# Patient Record
Sex: Male | Born: 1935 | Race: White | Hispanic: No | Marital: Married | Smoking: Never smoker
Health system: Southern US, Community
[De-identification: ages and names within clinical notes are randomized; demographics above are authoritative.]

## PROBLEM LIST (undated history)

## (undated) DIAGNOSIS — E669 Obesity, unspecified: Secondary | ICD-10-CM

## (undated) DIAGNOSIS — E785 Hyperlipidemia, unspecified: Secondary | ICD-10-CM

## (undated) DIAGNOSIS — G40909 Epilepsy, unspecified, not intractable, without status epilepticus: Secondary | ICD-10-CM

## (undated) DIAGNOSIS — I639 Cerebral infarction, unspecified: Secondary | ICD-10-CM

## (undated) DIAGNOSIS — N183 Chronic kidney disease, stage 3 (moderate): Secondary | ICD-10-CM

## (undated) DIAGNOSIS — Z8673 Personal history of transient ischemic attack (TIA), and cerebral infarction without residual deficits: Secondary | ICD-10-CM

## (undated) DIAGNOSIS — I2510 Atherosclerotic heart disease of native coronary artery without angina pectoris: Secondary | ICD-10-CM

## (undated) DIAGNOSIS — I5032 Chronic diastolic (congestive) heart failure: Secondary | ICD-10-CM

## (undated) DIAGNOSIS — K219 Gastro-esophageal reflux disease without esophagitis: Secondary | ICD-10-CM

## (undated) DIAGNOSIS — G629 Polyneuropathy, unspecified: Secondary | ICD-10-CM

## (undated) DIAGNOSIS — I10 Essential (primary) hypertension: Secondary | ICD-10-CM

## (undated) DIAGNOSIS — I82409 Acute embolism and thrombosis of unspecified deep veins of unspecified lower extremity: Secondary | ICD-10-CM

## (undated) DIAGNOSIS — R001 Bradycardia, unspecified: Secondary | ICD-10-CM

## (undated) DIAGNOSIS — N400 Enlarged prostate without lower urinary tract symptoms: Secondary | ICD-10-CM

## (undated) DIAGNOSIS — N210 Calculus in bladder: Secondary | ICD-10-CM

## (undated) DIAGNOSIS — I350 Nonrheumatic aortic (valve) stenosis: Secondary | ICD-10-CM

## (undated) HISTORY — DX: Obesity, unspecified: E66.9

## (undated) HISTORY — DX: Atherosclerotic heart disease of native coronary artery without angina pectoris: I25.10

## (undated) HISTORY — DX: Chronic diastolic (congestive) heart failure: I50.32

## (undated) HISTORY — DX: Gastro-esophageal reflux disease without esophagitis: K21.9

## (undated) HISTORY — DX: Polyneuropathy, unspecified: G62.9

## (undated) HISTORY — PX: TRANSURETHRAL RESECTION OF PROSTATE: SHX73

## (undated) HISTORY — DX: Nonrheumatic aortic (valve) stenosis: I35.0

## (undated) HISTORY — DX: Hyperlipidemia, unspecified: E78.5

## (undated) HISTORY — PX: OTHER SURGICAL HISTORY: SHX169

## (undated) HISTORY — DX: Bradycardia, unspecified: R00.1

## (undated) HISTORY — DX: Acute embolism and thrombosis of unspecified deep veins of unspecified lower extremity: I82.409

## (undated) HISTORY — DX: Essential (primary) hypertension: I10

## (undated) HISTORY — DX: Cerebral infarction, unspecified: I63.9

## (undated) HISTORY — DX: Personal history of transient ischemic attack (TIA), and cerebral infarction without residual deficits: Z86.73

## (undated) HISTORY — DX: Epilepsy, unspecified, not intractable, without status epilepticus: G40.909

## (undated) HISTORY — DX: Benign prostatic hyperplasia without lower urinary tract symptoms: N40.0

## (undated) HISTORY — DX: Calculus in bladder: N21.0

---

## 1998-03-30 ENCOUNTER — Encounter: Admission: RE | Admit: 1998-03-30 | Discharge: 1998-06-28 | Payer: Self-pay | Admitting: Neurology

## 2001-02-26 DIAGNOSIS — I82409 Acute embolism and thrombosis of unspecified deep veins of unspecified lower extremity: Secondary | ICD-10-CM

## 2001-02-26 HISTORY — DX: Acute embolism and thrombosis of unspecified deep veins of unspecified lower extremity: I82.409

## 2001-07-15 ENCOUNTER — Encounter: Payer: Self-pay | Admitting: Urology

## 2001-07-15 ENCOUNTER — Inpatient Hospital Stay (HOSPITAL_COMMUNITY): Admission: AD | Admit: 2001-07-15 | Discharge: 2001-07-16 | Payer: Self-pay | Admitting: Urology

## 2001-07-15 ENCOUNTER — Encounter: Payer: Self-pay | Admitting: *Deleted

## 2001-10-10 ENCOUNTER — Inpatient Hospital Stay (HOSPITAL_COMMUNITY): Admission: EM | Admit: 2001-10-10 | Discharge: 2001-10-13 | Payer: Self-pay | Admitting: *Deleted

## 2001-10-10 ENCOUNTER — Encounter: Payer: Self-pay | Admitting: *Deleted

## 2003-09-23 ENCOUNTER — Ambulatory Visit (HOSPITAL_COMMUNITY): Admission: RE | Admit: 2003-09-23 | Discharge: 2003-09-23 | Payer: Self-pay | Admitting: Family Medicine

## 2004-10-24 ENCOUNTER — Encounter (HOSPITAL_COMMUNITY): Admission: RE | Admit: 2004-10-24 | Discharge: 2005-01-22 | Payer: Self-pay | Admitting: Cardiology

## 2004-11-20 ENCOUNTER — Encounter (INDEPENDENT_AMBULATORY_CARE_PROVIDER_SITE_OTHER): Payer: Self-pay | Admitting: *Deleted

## 2004-11-20 ENCOUNTER — Inpatient Hospital Stay (HOSPITAL_COMMUNITY): Admission: RE | Admit: 2004-11-20 | Discharge: 2004-11-25 | Payer: Self-pay | Admitting: Urology

## 2006-07-02 ENCOUNTER — Emergency Department (HOSPITAL_COMMUNITY): Admission: EM | Admit: 2006-07-02 | Discharge: 2006-07-02 | Payer: Self-pay | Admitting: Emergency Medicine

## 2008-08-16 ENCOUNTER — Encounter (INDEPENDENT_AMBULATORY_CARE_PROVIDER_SITE_OTHER): Payer: Self-pay | Admitting: *Deleted

## 2008-08-16 ENCOUNTER — Inpatient Hospital Stay (HOSPITAL_COMMUNITY): Admission: EM | Admit: 2008-08-16 | Discharge: 2008-08-17 | Payer: Self-pay | Admitting: Emergency Medicine

## 2008-08-17 ENCOUNTER — Encounter (INDEPENDENT_AMBULATORY_CARE_PROVIDER_SITE_OTHER): Payer: Self-pay | Admitting: Internal Medicine

## 2008-08-20 ENCOUNTER — Encounter: Admission: RE | Admit: 2008-08-20 | Discharge: 2008-10-14 | Payer: Self-pay | Admitting: Internal Medicine

## 2008-12-15 ENCOUNTER — Emergency Department (HOSPITAL_COMMUNITY): Admission: EM | Admit: 2008-12-15 | Discharge: 2008-12-15 | Payer: Self-pay | Admitting: Emergency Medicine

## 2010-03-19 ENCOUNTER — Encounter: Payer: Self-pay | Admitting: Cardiology

## 2010-04-17 ENCOUNTER — Ambulatory Visit
Payer: Medicare Other | Attending: Rehabilitative and Restorative Service Providers" | Admitting: Rehabilitative and Restorative Service Providers"

## 2010-06-01 LAB — POCT CARDIAC MARKERS
CKMB, poc: <1 ng/mL — ABNORMAL LOW (ref 1.0–8.0)
Myoglobin, poc: 95.9 ng/mL (ref 12–200)
Troponin i, poc: <0.05 ng/mL (ref 0.00–0.09)

## 2010-06-01 LAB — DIFFERENTIAL
Basophils Absolute: 0.1 10*3/uL (ref 0.0–0.1)
Eosinophils Relative: 4 % (ref 0–5)
Lymphs Abs: 1.4 10*3/uL (ref 0.7–4.0)
Neutrophils Relative %: 70 % (ref 43–77)

## 2010-06-01 LAB — COMPREHENSIVE METABOLIC PANEL
AST: 14 U/L (ref 0–37)
Albumin: 3.1 g/dL — ABNORMAL LOW (ref 3.5–5.2)
Creatinine, Ser: 1.15 mg/dL (ref 0.4–1.5)
GFR calc Af Amer: >60 mL/min (ref >60)
GFR calc non Af Amer: >60 mL/min (ref >60)
Glucose, Bld: 97 mg/dL (ref 70–99)
Potassium: 4.4 mEq/L (ref 3.5–5.1)
Sodium: 143 mEq/L (ref 135–145)
Total Protein: 6.8 g/dL (ref 6.0–8.3)

## 2010-06-01 LAB — APTT: aPTT: 24 seconds (ref 24–37)

## 2010-06-01 LAB — PROTIME-INR
INR: 1.02 (ref 0.00–1.49)
Prothrombin Time: 13.3 seconds (ref 11.6–15.2)

## 2010-06-05 LAB — BASIC METABOLIC PANEL
BUN: 27 mg/dL — ABNORMAL HIGH (ref 6–23)
CO2: 27 mEq/L (ref 19–32)
Calcium: 8.7 mg/dL (ref 8.4–10.5)
Chloride: 107 mEq/L (ref 96–112)
GFR calc Af Amer: >60 mL/min (ref >60)
GFR calc Af Amer: >60 mL/min (ref >60)
GFR calc non Af Amer: 58 mL/min — ABNORMAL LOW (ref >60)
Glucose, Bld: 117 mg/dL — ABNORMAL HIGH (ref 70–99)
Sodium: 142 mEq/L (ref 135–145)
Sodium: 142 mEq/L (ref 135–145)

## 2010-06-05 LAB — LIPID PANEL
HDL: 35 mg/dL — ABNORMAL LOW (ref >39)
LDL Cholesterol: 144 mg/dL — ABNORMAL HIGH (ref 0–99)
Total CHOL/HDL Ratio: 6.6 RATIO
Triglycerides: 254 mg/dL — ABNORMAL HIGH (ref <150)
VLDL: 51 mg/dL — ABNORMAL HIGH (ref 0–40)

## 2010-06-05 LAB — CBC
HCT: 41.1 % (ref 39.0–52.0)
HCT: 41.7 % (ref 39.0–52.0)
Hemoglobin: 14.1 g/dL (ref 13.0–17.0)
MCHC: 34.3 g/dL (ref 30.0–36.0)
MCHC: 34.7 g/dL (ref 30.0–36.0)
MCV: 95.3 fL (ref 78.0–100.0)
MCV: 95.9 fL (ref 78.0–100.0)
RBC: 4.31 MIL/uL (ref 4.22–5.81)
RBC: 4.35 MIL/uL (ref 4.22–5.81)
WBC: 7.9 10*3/uL (ref 4.0–10.5)

## 2010-06-05 LAB — CARDIAC PANEL(CRET KIN+CKTOT+MB+TROPI)
CK, MB: 0.9 ng/mL (ref 0.3–4.0)
Relative Index: INVALID (ref 0.0–2.5)
Total CK: 41 U/L (ref 7–232)
Total CK: 78 U/L (ref 7–232)
Troponin I: 0.01 ng/mL (ref 0.00–0.06)

## 2010-06-05 LAB — APTT: aPTT: 25 seconds (ref 24–37)

## 2010-06-05 LAB — POCT CARDIAC MARKERS: CKMB, poc: <1 ng/mL — ABNORMAL LOW (ref 1.0–8.0)

## 2010-06-05 LAB — DIFFERENTIAL
Basophils Relative: 1 % (ref 0–1)
Eosinophils Relative: 5 % (ref 0–5)
Neutrophils Relative %: 62 % (ref 43–77)

## 2010-06-05 LAB — BRAIN NATRIURETIC PEPTIDE: Pro B Natriuretic peptide (BNP): 54 pg/mL (ref 0.0–100.0)

## 2010-07-11 NOTE — Discharge Summary (Signed)
Benjamin Kaiser, PENDER NO.:  [id number]   MEDICAL RECORD NO.:  [id number]          PATIENT TYPE:  INP   LOCATION:  2007                         FACILITY:  [facility]   PHYSICIAN:  Elliot Cousin, M.D.    DATE OF BIRTH:  24-Feb-1935   DATE OF ADMISSION:  08/16/2008  DATE OF DISCHARGE:  08/17/2008                               DISCHARGE SUMMARY   DISCHARGE DIAGNOSES:  1. Left-sided hemiparesis and paresthesias, likely secondary to an      acute right brain stroke or subcortical small vessel cerebral      infarction.  (The CT angiogram of the head did not reveal      radiographic evidence of an acute stroke).  2. A 60% stenosis of the proximal right internal carotid artery and      tapered obstruction of the left vertebral artery at the level of C2      likely represents a remote dissection per CT angiogram of the neck.      The carotid Doppler study revealed a 40-60% ICA stenosis on the      right and no significant ICA stenosis on the left.  Bilateral      vertebral artery flow was antegrade.  3. THE PATIENT HAS BEEN ENROLLED IN THE POINT TRIAL BY [facility].  4. Hypertension.  5. Seizure disorder.  6. Hyperlipidemia.  7. Mild bradycardia secondary to atenolol.   DISCHARGE MEDICATIONS:  1. Study drug once daily.  2. Lasix 80 mg half a tablet daily.  3. Lisinopril 40 mg daily.  4. Atenolol 100 mg daily.  5. Allopurinol 300 mg daily.  6. Extended release niacin 500 mg daily.  7. Pravastatin 40 mg daily (the the patient was advised to take      Lipitor 40 mg daily if he could afford it and if not, he was      advised to continue pravastatin as previously prescribed).  8. Dilantin 100 mg three tablets twice daily.  9. Aspirin 325 mg daily.   DISCHARGE DISPOSITION:  The patient is being discharged home in improved  and stable condition.  He was advised to follow up with his primary care  physician Dr. Windle Guard in 1 week and with the  [facility] per their recommendation.   CONSULTATIONS:  Neurologists, Dr. Roseanne Reno and Dr. Sharene Skeans.   PROCEDURES PERFORMED:  1. A 2-D echocardiogram on August 17, 2008.  The results are currently      pending.  2. Carotid duplex study, August 16, 2008.  The results revealed right 40-      60% ICA stenosis, no significant ICA stenosis on the left, and      vertebral flow antegrade bilaterally.  3. CT angiogram of the head on August 16, 2008.  The results revealed no      acute intracranial abnormality.  Mild distal small vessel      irregularity compatible with intracranial atherosclerotic change.      Nonvisualization of the left vertebral artery.  The vertebral      artery appears  to be occluded in the neck.  Minimal atherosclerotic      calcifications of the cavernous carotid arteries and at the dural      margin of the right vertebral artery.  4. CT angiogram of the neck on August 16, 2008.  The results revealed      60% stenosis of the proximal right internal carotid artery.  Mild      chronic change of the left common carotid bifurcation without      significant stenosis.  Tapered obstruction of the left vertebral      artery at the level of C2, likely represents a remote dissection.      The right vertebral artery is unremarkable.  5. Chest x-ray on August 16, 2008.  The results revealed stable,      borderline to mild cardiomegaly.  Suboptimal inspiration account      for mild bilateral lower lobe atelectasis.  Otherwise, no acute      cardiopulmonary abnormality.  6. CT scan of the head without contrast on August 16, 2008.  The results      revealed no acute intracranial abnormalities.  Mild chronic      pansinusitis, mild age appropriate generalized atrophy, and mild      changes of small vessel disease of the white matter diffusely.   HISTORY OF PRESENT ILLNESS:  The patient is a 76 year old man with a  past medical history significant for chronic seizure  disorder,  hypertension, and gout.  He presented to the emergency department with a  chief complaint of left upper extremity weakness and numbness.  When the  patient was evaluated in the emergency department, the CT scan of his  head revealed no acute intracranial abnormalities.  His lab data were  virtually unremarkable.  He was mildly hypertensive, otherwise  hemodynamically stable.  His chest x-ray revealed no acute  cardiopulmonary abnormalities.  His EKG revealed normal sinus rhythm  with a heart rate of 78 beats per minute and an incomplete right bundle  branch block.  The patient was admitted for further evaluation and  management.   For additional details, please see the dictated history and physical.   HOSPITAL COURSE:  1. ACUTE STROKE, LIKELY RIGHT BRAIN VERSUS SUBCORTICAL WITH LEFT-SIDED      HEMIPARESIS AND PARESTHESIAS.  The patient's swallowing was      evaluated by the registered nurse in the emergency department.  He      passed the bedside swallow evaluation.  The patient was therefore      started on a heart-healthy diet.  For further evaluation, an MRI of      the brain, MRA of the head, carotid duplex study, and a 2-D      echocardiogram were ordered.  He was subsequently started on      aspirin 325 mg daily (the patient had been taking 81 mg daily at      home).  Neurologist, Dr. Roseanne Reno was consulted.  He evaluated the      patient and per his assessment, the patient probably had an acute      subcortical small vessel cerebral infarction.  He noted that the      patient's body habitus was too large for the MRI scanner and      therefore it was cancelled and the patient was evaluated further      with a CT angiogram of the head and neck.  The results of the CT  angiogram are above.  Although, there was no clear radiographic      evidence of an acute stroke, clinically it was felt that the      patient did experience an acute infarction.  The patient was       approached by the Stroke Team regarding the Point Trial.  He      agreed.  Therefore, Plavix was not started.  He was given the study      drug in addition to aspirin.  Additional studies were ordered for      evaluation and the results revealed normal cardiac enzymes, normal      TSH of 2.093, and therapeutic phenytoin level.  His fasting lipid      profile was assessed and it revealed a total cholesterol of 230,      triglycerides of 254, HDL cholesterol of 35, and LDL cholesterol of      144.  The patient was maintained on niacin and pravastatin.  Both      the physical and occupational therapists evaluated the patient and      per their assessment, the patient would benefit from outpatient      therapy.  They did not believe he required inpatient or home      physical and occupational therapy.  The patient was given a      prescription to take to the [facility]      for further evaluation and management.  Of note, the results of the      carotid Doppler, as well as the CT angiogram of the neck were      consistent with 40-60% ICA stenosis on the right and a possible      remote left vertebral artery occlusion or dissection.  Dr.      Sharene Skeans, neurologist evaluated the patient today and felt that he      was stable for hospital discharge.  He recommended that the patient      continued aspirin at 325 mg daily.  The results of the 2-D      echocardiogram were pending at the time of hospital discharge.  The      patient (and his wife) was advised to call the dictating physician      in a couple of days for the results of the 2-D echocardiogram.  The      patient continues to have left-sided hemiparesis, although his left      arm strength has improved over the course of the hospitalization.  2. HYPERTENSION.  The patient was maintained on atenolol and      lisinopril.  He was also maintained on Lasix.  His heart rate      ranged from 58-69 beats per minute.   The bradycardia was perhaps      the consequence of atenolol.  He was asymptomatic.  3. HYPERLIPIDEMIA.  The patient was maintained on Pravachol and      niacin.  The results of his fasting lipid profile are indicated      above.  Per his history, Pravachol and niacin were started 3 months      ago.  His total cholesterol was elevated at 230 and his LDL      cholesterol was elevated at 144.  I suggested changing statin      therapy from Pravachol to Lipitor; however, the patient and his      wife were somewhat hesitant as they thought that the  copayment may      be quite expensive, although they were not sure.  I did give the      patient a prescription for Lipitor 40 mg daily and advised him to      take Lipitor instead of pravastatin if he could afford it.  If not,      he was advised to maintain pravastatin at the previous dose along      with niacin 500 mg daily.  4. SEIZURE DISORDER.  The patient was maintained on Dilantin.  His      phenytoin level was therapeutic.      Elliot Cousin, M.D.  Electronically Signed     DF/MEDQ  D:  08/17/2008  T:  08/18/2008  Job:  [id number]   cc:   Deanna Artis. Sharene Skeans, M.D.  Windle Guard, M.D.

## 2010-07-11 NOTE — H&P (Signed)
Benjamin Kaiser, Benjamin Kaiser NO.:  [id number]   MEDICAL RECORD NO.:  [id number]          PATIENT TYPE:  INP   LOCATION:  1826                         FACILITY:  [facility]   PHYSICIAN:  Della Goo, M.D. DATE OF BIRTH:  01-Dec-1934   DATE OF ADMISSION:  08/16/2008  DATE OF DISCHARGE:                              HISTORY & PHYSICAL   PRIMARY CARE PHYSICIAN:  Dr. Jeannetta Nap.   CHIEF COMPLAINT:  Right arm numbness, weakness.   HISTORY OF PRESENT ILLNESS:  This is a 76 year old male who was brought  to the emergency department by his wife secondary to awakening at 3 a.m.  with numbness, weakness in his left arm.  He reports he was sleeping and  he was trying to turn over and he could not move his left arm.  He  awakened and realized that his left arm was weak.  He denies having any  headache, chest pain, shortness of breath, fever, chills, nausea,  vomiting, diarrhea.  The patient denies having any speech difficulties  or any visual difficulties.   PAST MEDICAL HISTORY:  1. Significant for hypertension.  2. Seizure disorder.  3. Hyperlipidemia.  4. Gout.  5. Morbid obesity.  6. Hearing impairment with bilateral hearing aids.   PAST SURGICAL HISTORY:  History of a TURP in September 2006.   MEDICATIONS:  1. Pravastatin 40 mg 1 p.o. daily.  2. Niacin 500 mg 1 p.o. daily.  3. Lisinopril 40 mg 1 p.o. daily.  4. Lasix 40 mg 1 p.o. daily.  5. Dilantin Extended 100 mg p.o. b.i.d.  6. Atenolol 100 mg 1 p.o. daily.  7. Allopurinol 300 mg 1 p.o. daily   ALLERGIES:  No known drug allergies.   SOCIAL HISTORY:  The patient is married.  He is a nonsmoker, nondrinker.   FAMILY HISTORY:  Positive for hypertension in his mother.  Positive  heart disease in his mother and son.  No history of cerebrovascular  accidents in his family that he knows of.  No diabetes in his family.  Positive strong family history of cancer in his paternal side of the  family.  His father, his uncle,  his paternal aunts had cancer.   REVIEW OF SYSTEMS:  Pertinents are mentioned.   PHYSICAL EXAMINATION FINDINGS:  This is a 76 year old morbidly obese,  tall male in no visible discomfort or acute distress currently.  VITAL SIGNS:  Temperature 97.6, blood pressure 151/92, heart rate 56,  respirations 20, O2 saturations 96-99%.  HEENT EXAMINATION:  Normocephalic, atraumatic.  There is no scleral  icterus.  Pupils equally round, reactive to light.  Extraocular  movements are intact.  Funduscopic benign.  Nares are patent  bilaterally.  Oropharynx is clear.  There is no tongue deviation.  There  is no facial drooping.  Ears:  The patient has a hearing aid present in  the left ear.  NECK:  Supple, full range of motion.  No thyromegaly, adenopathy or  jugular venous distention.  CARDIOVASCULAR:  Regular rate and rhythm with mild bradycardia.  No  murmurs, gallops or rubs appreciated.  LUNGS:  Clear to  auscultation bilaterally.  ABDOMEN:  Positive bowel sounds, soft, nontender, nondistended.  No  hepatosplenomegaly.  EXTREMITIES:  Without cyanosis, clubbing or edema.  NEUROLOGIC EXAMINATION:  The patient is alert and oriented x3.  Cranial  nerves are intact except for the hearing impairment in both ears.  There  is left upper arm weakness.  The patient is unable to move the left  upper extremity.  The left arm is flaccid.  Right upper extremity has  full range of motion, and sensory function in the right upper extremity  is intact.  Both lower extremities have full range of motion and sensory  and motor function are intact.  Babinski sign is positive in the left  foot and is equivocal in the right foot.   LABORATORY STUDIES:  White blood cell count 7.9, hemoglobin 14.5,  hematocrit 41.7, platelets 185, neutrophils 62%, lymphocytes 24%.  Pro  time 13.8, INR 1.0, PTT 25.  Point-of-care cardiac markers with a  myoglobin of 71.7, CK-MB less than 1.0, troponin less than 0.05.  Sodium  142,  potassium 4.5, chloride 106, carbon dioxide 26, BUN 27, creatinine  1.23 and glucose 117.  CT scan of the head performed.  Results are  negative for any acute findings.  Surgical changes consistent with a  previous frontal craniotomy are present.  Chest x-ray reveals mild  cardiomegaly but no acute cardiopulmonary disease process is seen.  EKG  reveals a normal sinus rhythm and the rate is 78 on the EKG, and an age-  indeterminate anterior infarct is present.  Also, the patient has first-  degree AV block present.   ASSESSMENT:  A 76 year old male being admitted with:  1. Left upper extremity weakness, most probably an acute      cerebrovascular accident.  2. Hypertension.  3. Bradycardia.  4. Hyperlipidemia.  5. Seizure disorder.  6. Morbid obesity.   PLAN:  The patient will be admitted for a cerebrovascular accident  workup.  The patient will be admitted to a telemetry area.  Cardiac  enzymes will be performed.  Neurologic checks will be performed as well.  The patient will be sent for an MRI/MRA study of the brain along with a  carotid ultrasound study.  His regular medications have been verified.  They will be continued.  The patient is having no swallowing or speaking  difficulties at this time.  DVT and GI prophylaxis will be ordered, and  a neurology consultation has been requested.  Further workup will ensue  pending results of the patient's clinical course and results of his  studies.      Della Goo, M.D.  Electronically Signed     HJ/MEDQ  D:  08/16/2008  T:  08/16/2008  Job:  [id number]

## 2010-07-11 NOTE — Consult Note (Signed)
NAMEELHADJI, Benjamin Kaiser NO.:  [id number]   MEDICAL RECORD NO.:  [id number]          PATIENT TYPE:  INP   LOCATION:  2007                         FACILITY:  [facility]   PHYSICIAN:  Benjamin Christmas, MD    DATE OF BIRTH:  August 08, 1934   DATE OF CONSULTATION:  DATE OF DISCHARGE:                                 CONSULTATION   REFERRING PHYSICIAN:  Della Kaiser, M.D.   REASON FOR CONSULTATION:  New onset left upper extremity weakness and  numbness.   HISTORY OF PRESENT ILLNESS:  This is a 76 year old man who woke up about  3 a.m. today with numbness and weakness involving his left arm and hand.  He had no deficit when he went to bed about 3 hours earlier.  He came to  the emergency room around 5 a.m. and was beyond the time window for TPA.  Symptoms remained unchanged since first noticed.  CT of his head showed  no acute intracranial abnormality including no hemorrhage.  The patient  has been on aspirin daily.  There is no previous history of stroke or  TIA.  He had no speech change nor facial weakness.  There was also no  change in his gait.   PAST MEDICAL HISTORY:  Remarkable for gout, arthritis involving hips and  knees, hypertension, hyperlipidemia, chronic seizure disorder.  The  patient's last seizure was in early 1990s.   CURRENT MEDICATIONS:  1. Allopurinol 300 mg per day.  2. Atenolol 100 mg per day.  3. Dilantin extended release capsules 100 mg twice a day.  4. Lasix 80 mg per day.  5. Lisinopril 40 mg per day.  6. Niacin 500 mg per day.  7. Pravastatin 40 mg per day.   FAMILY HISTORY:  Noncontributory.  There is no family history of stroke  or TIA.   PHYSICAL EXAMINATION:  GENERAL:  Appearance was that of a tall, obese,  late middle-aged slightly elderly appearing man who was alert and  cooperative and in no acute distress.  His mental status was normal.  HEENT:  Pupils were equally reactive normally to light.  Extraocular  movements were full and  conjugate.  NEUROLOGIC:  Visual fields were intact and normal.  There was no facial  numbness, no facial weakness.  He was moderately hard of hearing.  Speech and palatal movement were normal.  Coordination of his right  upper extremity was normal.  He had flaccid weakness of his left upper  extremity which was moderately severe proximally and distally.  Strength  of his lower extremities was normal.  Deep tendon reflexes were 1+ and  symmetrical in the upper extremities and absent at the knees and ankles.  Plantar responses were mute.  Sensory exam was normal except for  decreased perception of touch over his left upper extremity.  Carotid  auscultation was normal.   CLINICAL IMPRESSION:  1. Probable acute subcortical small vessel cerebral infarction.  2. Chronic seizure disorder, well controlled.   RECOMMENDATIONS:  1. CTA of the head and neck, in lieu of MRI which cannot be done  because of the patient's weight (greater than 350 pounds).  2. Discontinue aspirin and start Plavix 75 mg per daily.  3. Echocardiogram.  4. Physical therapy and occupational therapy consults.   Thank you for asking me to evaluate [name].      Benjamin Christmas, MD  Electronically Signed     CS/MEDQ  D:  08/16/2008  T:  08/16/2008  Job:  [id number]

## 2010-07-14 NOTE — Discharge Summary (Signed)
[name], Benjamin Kaiser NO.:  [id number]   MEDICAL RECORD NO.:  [id number]          PATIENT TYPE:  INP   LOCATION:  1401                         FACILITY:  [facility]   PHYSICIAN:  Bertram Millard. Dahlstedt, M.D.DATE OF BIRTH:  December 08, 1934   DATE OF ADMISSION:  11/20/2004  DATE OF DISCHARGE:  11/25/2004                                 DISCHARGE SUMMARY   DISCHARGE DIAGNOSES:  1.  Benign prostatic hypertrophy.  2.  Bladder stones.  3.  Bladder diverticulum.   PROCEDURE:  1.  Suprapubic prostatectomy.  2.  Cystolithotomy.   SURGEON:  Bertram Millard. Dahlstedt, MD.   CONSULTANTS:  None.   HOSPITAL COURSE:  On 11/20/2004, the patient was taken to the operating room  and underwent the above-named procedure, which he tolerated without  complications.  He was taken to the PACU and then the urology floor in  stable condition, where he remained throughout his hospital stay.  The  hospital stay consisted of progressive toleration of ambulation, pain and  diet.  The patient's bladder was maintained with suprapubic tube as well as  a Foley catheter with continuous bladder irrigation.  His continuous bladder  irrigation was weaned over the first two to three postoperative days.  On  postoperative day number four, his urethral catheter was removed.  His  suprapubic tube was clamped, and he was given a trial of void which he  failed.  Secondary to this, the suprapubic tube was unclamped.  Other than  this, the patient's hospital course was uncomplicated.  On postoperative day  number six, it was determined he was in stable condition to be discharged  home.   DISCHARGE INSTRUCTIONS:  The patient is given routine wound care  instructions.  He is instructed to call or return if he experiences any  fevers, chills, redness or drainage from his wound.  He is instructed not to  drive for the next 2 weeks or while on narcotic pain medicine.  He was  instructed not to lift more than 10 pounds  for the next 6 weeks.  He was  given a leg bag and instructions on care for his suprapubic tube.  He was  instructed to call or return immediately if his suprapubic tube was not  draining properly or was passing clots.  He understands and agrees with  these instructions.   DISCHARGE MEDICATIONS:  1.  Vicodin.  2.  Colace.  3.  Keflex.  4.  Allopurinol.  5.  Atenolol.  6.  Dilantin.  7.  Flomax.     ______________________________  Glade Nurse, MD      Bertram Millard. Dahlstedt, M.D.  Electronically Signed    MT/MEDQ  D:  12/19/2004  T:  12/19/2004  Job:  [id number]   cc:   Colleen Can. Deborah Chalk, M.D.  Fax: [phone]

## 2010-07-14 NOTE — Discharge Summary (Signed)
NAME:  Benjamin Kaiser, Benjamin Kaiser NO.:  [id number]   MEDICAL RECORD NO.:  [id number]                   PATIENT TYPE:  INP   LOCATION:  5733                                 FACILITY:  [facility]   PHYSICIAN:  Deirdre Peer. Polite, M.D.              DATE OF BIRTH:  1934-04-28   DATE OF ADMISSION:  10/10/2001  DATE OF DISCHARGE:                                 DISCHARGE SUMMARY   DISCHARGE DIAGNOSES:  1. Right lower extremity deep vein thrombosis in popliteal area on     discharge. The patient to have home Lovenox 180 mg subcutaneously b.i.d.     Coumadin therapy to be managed by Dr. Windle Guard,  office has been     notified.  2. History of cystoscopy secondary to multiple bladder calculi.  3. Hypertension.  4. Seizure disorder.  5. Benign prostatic hypertrophy.  6. Obesity.   DISCHARGE MEDICATIONS:  1. Coumadin 10 mg, dose to be modified as needed.  2. Lovenox 180 mg subcutaneously b.i.d.  3. Dilantin 200 mg in the morning, 300 mg in the evening.  4. Allopurinol 300 mg q.d.  5. Lotensin 20 mg q.d.  6. Flomax 0.4 mg q.d.  7. Norvasc 10 mg q.d.   LABORATORY DATA:  INR on discharge 1.1.  CBC: Hemoglobin 14.   Chest x-ray: No active disease.   EKG:  No acute change, normal sinus rhythm, incomplete right bundle branch  block.   HISTORY OF PRESENT ILLNESS:  A 76 year old white male admitted after having  outpatient ultrasound revealing deep vein thrombosis in the right popliteal  area.  In the ED, the patient was hemodynamically stable, exam consistent  with right greater than left extremity calf.  The patient was without chest  pain, shortness of breath, palpitations.  No history of trauma.  The patient  has been immobile for greater than three months status post bladder surgery.  Denies history of DVT or family history of DVT.  The patient was admitted  for management and treatment of right lower extremity DVT.  Please see  dictated H&P for further details on  History of Present Illness.   HOSPITAL COURSE:  DEEP VEIN THROMBOSIS IN RIGHT POPLITEAL AREA:  The patient's risk factors  include immobility.  The patient was started on heparin initially,  ultimately placed on Lovenox.  Ultimately, the patient was approved for home  Lovenox.  There were no complications throughout this hospitalization, no  problems with bleeding.  The patient was started on Coumadin with slow rise  in INR; in fact, INR on discharge is 1.1.  The patient will receive 15 mg  Coumadin prior to discharge with 8 mg a day.  Will have outpatient followup  with his primary MD, Dr. Windle Guard.  Office has been notified and will  be following the patient up for PT/INR and Coumadin management.  The patient  will follow up with Dr.  Jeannetta Nap tomorrow for INR draw.   The patient has several other chronic medical problems which were stable  throughout this hospitalization.  The patient is to continue current  outpatient medicines.                                               Deirdre Peer. Polite, M.D.    RDP/MEDQ  D:  10/13/2001  T:  10/15/2001  Job:  [id number]   cc:   Andrey Campanile [phone]) Jeannetta Nap

## 2010-07-14 NOTE — H&P (Signed)
NAME:  Benjamin Kaiser, Benjamin Kaiser NO.:  [id number]   MEDICAL RECORD NO.:  [id number]                   PATIENT TYPE:  INP   LOCATION:  5733                                 FACILITY:  [facility]   PHYSICIAN:  Gordy Savers, M.D. [facility]      DATE OF BIRTH:  04-Feb-1935   DATE OF ADMISSION:  10/10/2001  DATE OF DISCHARGE:  10/13/2001                                HISTORY & PHYSICAL   CHIEF COMPLAINT:  Swelling right leg.   HISTORY OF PRESENT ILLNESS:  The patient is a 76 year old white gentleman  who was stable until early June when he underwent cystoscopy for  fragmentation of bladder calculi.  Since that time, he has noted  intermittent right lower extremity edema and some intermittent mild pain.  This has intensified over the past two or three weeks.  On the day of  admission due to his wife's encouragement, was seen by his primary care  physician, Dr. Jeannetta Nap, and referred to [facility] for a venous  Doppler evaluation.  This confirmed a DVT involving the popliteal vein and  the patient is now admitted for further evaluation and treatment.   PAST MEDICAL HISTORY:  The patient was admitted briefly at [facility] on July 31, 2001, for cystoscopy and attempts at fragmentation of  multiple large bladder calculi.  The patient presented at that time with  intermittent acute urinary retention.  Due to his large body habitus, this  was unsuccessful and he was referred to Dr. Beverely Pace at [facility]  where he was treated successfully for the bladder calculi.  Medical problems  include hypertension.  He states that he has had elevated blood pressure  reading since his high school years.  He was hospitalized in 1991 and  diagnosed with a seizure disorder.  He was evaluated by Dr. Sandria Manly and Dr.  Deborah Chalk at that time.  Other medical problems include BPH, obesity, history  of kidney stones.   PAST SURGICAL HISTORY:  Sinus operation in 1978 by Dr.  Nedra Hai, in 1982 he  underwent a hemorrhoidectomy by Dr. Earlene Plater, and he also had a childhood  tonsillectomy.   MEDICATIONS:  1. Dilantin 200 mg in the morning, 300 mg at bedtime.  2. Allopurinol 300 mg daily.  3. Lotrel 5/20 one daily.  4. Flomax 0.4 mg daily.   REVIEW OF SYSTEMS:  This is otherwise unremarkable.  The patient denies any  shortness of breath, cough or other pulmonary complaints.  The patient did  have a negative Cardiolite stress test prior to his urological procedure in  June of this year.  He has been followed frequently at the [facility] for  eye car.   FAMILY HISTORY:  Noncontributory.  Father died of lung cancer at age 6.  Mother died of sudden cardiac death at age 47.  One brother also has kidney  stones.   SOCIAL HISTORY:  The patient is married.  He has  three children.  One son  deceased from a suicide death.  The patient is a former tobacco user but not  for the past 15 years.  No ethanol use.   PHYSICAL EXAMINATION:  GENERAL APPEARANCE:  A well-developed, markedly  overweight male in no acute distress.  SKIN:  Unremarkable.  HEENT:  Normal pupil responses.  Fundi unremarkable.  Ears, nose and throat  negative.  NECK:  No bruits, adenopathy, or thyroid enlargement.  CHEST:  Clear.  CARDIOVASCULAR:  S1 and S2 normal.  Nor murmurs or gallops.  ABDOMEN:  Obese, soft, nontender with no organomegaly.  No masses or bruits  appreciated.  EXTERNAL GENITALIA:  Normal.  Prostate not examined.  EXTREMITIES:  Full peripheral pulses.  The patient had +3 edema distal to  the right knee.  There was only minimal tenderness and slightly excessive  warmth involving the calf area.   IMPRESSION:  Right lower extremity deep venous thrombosis.   ADDITIONAL DIAGNOSES:  1. Hypertension.  2. Exogenous obesity.  3. Status post recent cystoscopy with calculi fragmentation.   DISPOSITION:  The patient will be admitted to the hospital for evaluation  and treatment.  He will  be placed on IV heparin and oral Coumadin.  Laboratory studies will be reviewed including a Dilantin level.                                                Gordy Savers, M.D. [facility]    PFK/MEDQ  D:  10/10/2001  T:  10/14/2001  Job:  [id number]

## 2010-07-14 NOTE — Op Note (Signed)
NAMENICKY, KRAS NO.:  [id number]   MEDICAL RECORD NO.:  [id number]          PATIENT TYPE:  INP   LOCATION:  0161                         FACILITY:  [facility]   PHYSICIAN:  Bertram Millard. Dahlstedt, M.D.DATE OF BIRTH:  Dec 25, 1934   DATE OF PROCEDURE:  11/20/2004  DATE OF DISCHARGE:                                 OPERATIVE REPORT   PREOPERATIVE DIAGNOSES:  1.  Benign prostatic hypertrophy.  2.  Bladder stones.  3.  Bladder diverticula postoperative diagnosis.   POSTOPERATIVE DIAGNOSES:  1.  Benign prostatic hypertrophy.  2.  Bladder stones.  3.  Bladder diverticula postoperative diagnosis.   PROCEDURE:  1.  Suprapubic prostatectomy.  2.  Open cystolithotomy.   SURGEON:  Bertram Millard. Dahlstedt, MD.   ASSISTANT:  Glade Nurse, MD.   ANESTHESIA:  General endotracheal.   SPECIMENS:  1.  Bladder stones.  2.  Prostatic adenoma.   INDICATIONS:  This is a 76 year old gentleman with a history of recurrent  lower urinary tract symptoms with history of cystolitholapaxy by Dr.  Merilynn Finland in the past. He was not be able to undergo TURP at this time due  to the prolonged nature of the procedure.  He has been on  medical treatment  which he has failed. His lower urinary tract symptoms have been  progressively worse with recurring urinary tract infections and recurrent  bladder stones. He underwent TRUS  evaluation of the prostate which revealed  a 280 mL prostatic gland and axilla imaging revealed multiple large bladder  stones. After reviewing the patient's options, he has elected to proceed  with suprapubic prostatectomy.   DESCRIPTION OF PROCEDURE:  The patient was identified by his wrist bracelet,  and brought to room 10 where he received preoperative antibiotics and was  prepped and draped in the usual sterile fashion taking care to avoid  peripheral neuropathy or compartment syndrome. Next, a 20-French Foley  catheter was placed in his anterior urethra to  the level of the urinary  bladder, clear urine output was obtained. Next we made a lower midline  incision. Because of his obesity, we had to make the incision quite long, it  was approximately 20 cm in length. We dissected down through dermis, quite a  large amount of fat to the level of Scarpa's fascia through Scarpa's fascia  to the anterior sheath. We then identified the linea alba by the crossing  fibers which was divided with Bovie cautery along the length of the  incision. Next we identified the bodies of the pyramidalis and rectus and  they were divided bluntly. Following this, we set our retractors, the Foley  catheter was instilled with 500 mL of sterile normal saline to distend the  bladder. The bladder was identified and opened along its midline vertically  from the bladder neck to the area of the dome. We then put 3-0 silk stay  sutures through the edges of the cystotomy for identification and traction.  Next, we removed 5-7 very large bladder stones. The bladder was inspected  from inside, the ureteral orifices were noted in their normal anatomic  position. They were effluxing clear urine. He was noted to have a very large  median lobe and prostatic adenoma. His diverticulum was noted to be from the  dome of the bladder and encompassed a large volume of the bladder,  approximately 40-50% of the overall bladder volume and was very wide mouth  approximately 4 fingerbreadth's wide at its ostium. We then packed off the  diverticulum of the dome of bladder with moist lap sponges and of the  Bookwalter retractor was reset to expose the trigone and base of the  prostate. Next, we scored circumferentially around the prostatic adenoma  being careful to stay away from the ureteral orifices posteriorly. Next with  the surgeon's first finger, we developed a plane along the capsule  circumferentially and carefully with the surgeon's first finger, we  dissected from the base to the apex  circumferentially dissecting out the  adenoma. This was quite difficult due to the patient's overall body habitus  as well as his deep pelvis and long narrow prostatic fossa. Eventually the  prostatic adenoma was removed in two the lobes. There was no remaining  adenoma. At this time, we then addressed hemostasis and we first packed the  prostatic fossa with moist sponges. We then removed the packing and used  approximately 15 mL of FloSeal in 5 mL aliquots waiting approximately 5-10  minutes between each application and providing packing on top of each  application. This greatly helped with hemostasis. The prostatic fossa  required no suture ligation after the FloSeal was placed. Next we placed a  24 [place] Ainsworth forced suprapubic tube. We made a small 0.5 cm  transverse incision to the right side of the patient's midline incision,  dissected lateral to the fascial edge with Vanderbilt tonsil and pierced  through the fascia. We then brought the Foley catheter with Ainsworth  through on a pass and we made a small cystotomy superior to our incision in  been between the area of the diverticulum and the dome of the bladder. We  inserted the Ainsworth catheter and inflated it with 15 mL  of sterile  water. The bladder was then closed in two layers with running 2-0 Vicryl  taking full-thickness bites with each layer. We were careful not to involve  the underlying Foley catheter or Ainsworth suprapubic tube in the closure.  Once both layers of closure were done, we placed an additional 15 mL in the  Ainsworth catheter. Next we secured the suprapubic tube with a 2-0 Vicryl  pursestring suture. Next we irrigated the bladder by clamping off the Foley  catheter instilling sterile saline through the suprapubic tube. The  cystotomy repairs were found to be watertight. They flushed quantitatively with light pink urine output. Next we closed the patient's fascia with #1  PDS and the skin was closed  with staples. The suprapubic tube was fixed to a  drainage bag and continuous bladder irrigation was run through the Foley  catheter. Dressings were applied. The patient was reversed from his  anesthesia which he tolerated without complications. Please note Dr. Marcine Matar was present and participated in all aspects of this case.     ______________________________  Glade Nurse, MD      Bertram Millard. Dahlstedt, M.D.  Electronically Signed    MT/MEDQ  D:  11/20/2004  T:  11/21/2004  Job:  [id number]

## 2010-07-14 NOTE — Discharge Summary (Signed)
[facility]  Patient:    Benjamin Kaiser, Benjamin Kaiser Visit Number: [id number] MRN: [id number]          Service Type: EMS Location: [place] [place] Attending Physician:  Carmelina Peal Dictated by:   Verl Dicker, M.D. Admit Date:  07/15/2001 Discharge Date: 07/15/2001                             Discharge Summary  DATE OF BIRTH:  08/20/1934  PREOPERATIVE DIAGNOSIS:  Four- to five-year history of bladder calculi, last seen in our office 1998, presented to the emergency room Jul 15, 2001, stone had apparently fallen across the prosthetic urethra creating urinary retention and a postvoid residual about 800 cc.  HOSPITAL COURSE:  The patient was transferred from [facility] Emergency Room to [facility] ER where he was taken to the operating room for cystoscopy with attempt at fragmentation of bladder stones.  With the patient carefully positioned on the cystoscopy table, taking care secondary to the patients body habitus (6 feet 1 inch tall, 390 pounds), cystoscopy showed at least 4-6 densely laminated stones within the bladder.  Using the 1000 _________ laser fiber at a setting of 1.0 to 1.4 joules, fragmentation was commenced; however, due to the dense nature of the stones after almost an hour of using the laser, several holes had been created in the stones but we were unable to fragment any of the stones.  Cystoscope was removed.  Foley catheter was placed.  The patient was returned to recovery.  Had an uneventful recovery. By postop day #1 urine was clear, catheter was left to straight drain, the patient was discharged home with p.o. Macrobid and p.o. Vicodin.  Difficult clinical situation due to the patients body habitus.  We will discuss situation with the patient and family members regarding referral to tertiary care center or open cystolithotomy. Dictated by:   Verl Dicker, M.D. Attending Physician:  Carmelina Peal DD:  07/29/01 TD:  07/30/01 Job:  [id number]

## 2010-07-14 NOTE — Op Note (Signed)
[facility]  Patient:    Benjamin Kaiser, Benjamin Kaiser Visit Number: [id number] MRN: [id number]          Service Type: EMS Location: [place] [place] Attending Physician:  Carmelina Peal Dictated by:   Verl Dicker, M.D. Proc. Date: 07/15/01 Admit Date:  07/15/2001 Discharge Date: 07/15/2001   CC:         Buren Kos, M.D.  Colleen Can. Deborah Chalk, M.D.   Operative Report  DATE OF BIRTH:  01/15/1935  REFERRING PHYSICIAN: 1. Hadassah Pais. Jeannetta Nap, M.D. 2. Colleen Can. Deborah Chalk, M.D. 3. Genene Churn. Love, M.D.  PREOPERATIVE DIAGNOSIS:  Five to six bladder calculi.  POSTOPERATIVE DIAGNOSIS:  Five to six bladder calculi.  OPERATION PERFORMED:  Cystoscopy with partial fragmentation of bladder stones.  SURGEON:  Verl Dicker, M.D.  ANESTHESIA:  General.  DRAINS:  24 French Foley.  Indications, medications, allergies, tobacco, ETOH, past medical history, social history, physical exam and review of systems all outlined in the admitting note.  The patient had presented to the emergency room Jul 15, 2001 in urinary retention.  A Foley catheter was placed with immediate return of 800 cc of tea-colored urine.  CT of the abdomen showed minimal upper tract dilation bilaterally with five to six dense stones in the bladder.  DESCRIPTION OF PROCEDURE:   The patient was taken to room 5 in the operating room.  A well-lubricated panendoscope was gently inserted at the urethral meatus.  Due to the patients body habitus, 6 feet 2 inches tall, 380 pounds, the conventional cystoscope could not reach the trigone.  For this reason, it was replaced with the [facility] long instruments.  The long instruments were able to reach the bladder.  Densely laminated stones at least five to six were identified throughout the bladder.  There appeared to be no evidence of ureteral calculi.  There was bullous edema surrounding both ureters but no evidence of obstruction or distal calculi.  Using  the holmium laser fiber, 1000 with the setting at 1.0 to 1.4 joules, fragmentation was commenced due to the dense nature of the stones despite fragmentation of the stones was quite difficult.  After almost an hour of using the laser, several holes had been created in the stones but we were still unable to fragment any of the stones. The patient was not felt to be a candidate for open cystolithotomy.  The procedure was terminated, bladder was filled to capacity.  Sheath was withdrawn and a 24 French Foley catheter was inserted and left to straight drain.  The patient was returned to the recovery in satisfactory condition. Dictated by:   Verl Dicker, M.D. Attending Physician:  Carmelina Peal DD:  07/15/01 TD:  07/17/01 Job: [id number]

## 2010-10-16 ENCOUNTER — Emergency Department (HOSPITAL_COMMUNITY): Payer: Medicare Other

## 2010-10-16 ENCOUNTER — Inpatient Hospital Stay (HOSPITAL_COMMUNITY)
Admission: EM | Admit: 2010-10-16 | Discharge: 2010-10-23 | DRG: 246 | Disposition: A | Discharge status: Home Health | Payer: Medicare Other | Source: Ambulatory Visit | Attending: Cardiology | Admitting: Cardiology

## 2010-10-16 DIAGNOSIS — Z87891 Personal history of nicotine dependence: Secondary | ICD-10-CM

## 2010-10-16 DIAGNOSIS — M109 Gout, unspecified: Secondary | ICD-10-CM | POA: Diagnosis present

## 2010-10-16 DIAGNOSIS — Z79899 Other long term (current) drug therapy: Secondary | ICD-10-CM

## 2010-10-16 DIAGNOSIS — G40909 Epilepsy, unspecified, not intractable, without status epilepticus: Secondary | ICD-10-CM | POA: Diagnosis present

## 2010-10-16 DIAGNOSIS — E785 Hyperlipidemia, unspecified: Secondary | ICD-10-CM | POA: Diagnosis present

## 2010-10-16 DIAGNOSIS — I509 Heart failure, unspecified: Secondary | ICD-10-CM | POA: Diagnosis present

## 2010-10-16 DIAGNOSIS — R339 Retention of urine, unspecified: Secondary | ICD-10-CM | POA: Diagnosis present

## 2010-10-16 DIAGNOSIS — Z7982 Long term (current) use of aspirin: Secondary | ICD-10-CM

## 2010-10-16 DIAGNOSIS — Z86718 Personal history of other venous thrombosis and embolism: Secondary | ICD-10-CM

## 2010-10-16 DIAGNOSIS — I2582 Chronic total occlusion of coronary artery: Secondary | ICD-10-CM | POA: Diagnosis present

## 2010-10-16 DIAGNOSIS — I498 Other specified cardiac arrhythmias: Secondary | ICD-10-CM | POA: Diagnosis present

## 2010-10-16 DIAGNOSIS — Z8249 Family history of ischemic heart disease and other diseases of the circulatory system: Secondary | ICD-10-CM

## 2010-10-16 DIAGNOSIS — I517 Cardiomegaly: Secondary | ICD-10-CM

## 2010-10-16 DIAGNOSIS — I5033 Acute on chronic diastolic (congestive) heart failure: Secondary | ICD-10-CM | POA: Diagnosis not present

## 2010-10-16 DIAGNOSIS — I6529 Occlusion and stenosis of unspecified carotid artery: Secondary | ICD-10-CM | POA: Diagnosis present

## 2010-10-16 DIAGNOSIS — Z801 Family history of malignant neoplasm of trachea, bronchus and lung: Secondary | ICD-10-CM

## 2010-10-16 DIAGNOSIS — I2510 Atherosclerotic heart disease of native coronary artery without angina pectoris: Secondary | ICD-10-CM | POA: Diagnosis present

## 2010-10-16 DIAGNOSIS — R079 Chest pain, unspecified: Secondary | ICD-10-CM

## 2010-10-16 DIAGNOSIS — I214 Non-ST elevation (NSTEMI) myocardial infarction: Principal | ICD-10-CM | POA: Diagnosis present

## 2010-10-16 DIAGNOSIS — I10 Essential (primary) hypertension: Secondary | ICD-10-CM | POA: Diagnosis present

## 2010-10-16 DIAGNOSIS — Z8673 Personal history of transient ischemic attack (TIA), and cerebral infarction without residual deficits: Secondary | ICD-10-CM

## 2010-10-16 DIAGNOSIS — N320 Bladder-neck obstruction: Secondary | ICD-10-CM | POA: Diagnosis present

## 2010-10-16 DIAGNOSIS — I452 Bifascicular block: Secondary | ICD-10-CM | POA: Diagnosis present

## 2010-10-16 DIAGNOSIS — I440 Atrioventricular block, first degree: Secondary | ICD-10-CM | POA: Diagnosis present

## 2010-10-16 DIAGNOSIS — Z7902 Long term (current) use of antithrombotics/antiplatelets: Secondary | ICD-10-CM

## 2010-10-16 DIAGNOSIS — Z6841 Body Mass Index (BMI) 40.0 and over, adult: Secondary | ICD-10-CM

## 2010-10-16 LAB — CBC
MCV: 93.6 fL (ref 78.0–100.0)
Platelets: 192 10*3/uL (ref 150–400)
RDW: 13.1 % (ref 11.5–15.5)
WBC: 8.8 10*3/uL (ref 4.0–10.5)

## 2010-10-16 LAB — COMPREHENSIVE METABOLIC PANEL
ALT: 13 U/L (ref 0–53)
Alkaline Phosphatase: 87 U/L (ref 39–117)
CO2: 26 mEq/L (ref 19–32)
Calcium: 9.7 mg/dL (ref 8.4–10.5)
GFR calc Af Amer: >60 mL/min (ref >60)
GFR calc non Af Amer: >60 mL/min (ref >60)
Glucose, Bld: 122 mg/dL — ABNORMAL HIGH (ref 70–99)
Potassium: 4 mEq/L (ref 3.5–5.1)
Sodium: 141 mEq/L (ref 135–145)
Total Bilirubin: 0.2 mg/dL — ABNORMAL LOW (ref 0.3–1.2)

## 2010-10-16 LAB — HEMOGLOBIN A1C
Hgb A1c MFr Bld: 5.5 % (ref <5.7)
Mean Plasma Glucose: 111 mg/dL (ref <117)

## 2010-10-16 LAB — DIFFERENTIAL
Basophils Absolute: 0 10*3/uL (ref 0.0–0.1)
Eosinophils Absolute: 0.4 10*3/uL (ref 0.0–0.7)
Eosinophils Relative: 5 % (ref 0–5)
Lymphs Abs: 1.6 10*3/uL (ref 0.7–4.0)

## 2010-10-16 LAB — CARDIAC PANEL(CRET KIN+CKTOT+MB+TROPI)
CK, MB: 8.5 ng/mL (ref 0.3–4.0)
Total CK: 92 U/L (ref 7–232)

## 2010-10-16 LAB — POCT I-STAT TROPONIN I

## 2010-10-16 LAB — HEPARIN LEVEL (UNFRACTIONATED): Heparin Unfractionated: 0.28 IU/mL — ABNORMAL LOW (ref 0.30–0.70)

## 2010-10-16 LAB — TSH: TSH: 0.804 u[IU]/mL (ref 0.350–4.500)

## 2010-10-17 DIAGNOSIS — I2510 Atherosclerotic heart disease of native coronary artery without angina pectoris: Secondary | ICD-10-CM

## 2010-10-17 LAB — CBC
HCT: 39.7 % (ref 39.0–52.0)
MCHC: 34.8 g/dL (ref 30.0–36.0)
MCV: 92.5 fL (ref 78.0–100.0)
RDW: 12.8 % (ref 11.5–15.5)
WBC: 9.2 10*3/uL (ref 4.0–10.5)

## 2010-10-17 LAB — URINE MICROSCOPIC-ADD ON

## 2010-10-17 LAB — BASIC METABOLIC PANEL
BUN: 18 mg/dL (ref 6–23)
GFR calc non Af Amer: >60 mL/min (ref >60)
Glucose, Bld: 107 mg/dL — ABNORMAL HIGH (ref 70–99)
Potassium: 3.9 mEq/L (ref 3.5–5.1)

## 2010-10-17 LAB — URINALYSIS, ROUTINE W REFLEX MICROSCOPIC
Bilirubin Urine: NEGATIVE
Glucose, UA: NEGATIVE mg/dL
Specific Gravity, Urine: 1.022 (ref 1.005–1.030)
pH: 5.5 (ref 5.0–8.0)

## 2010-10-17 LAB — LIPID PANEL
HDL: 45 mg/dL (ref >39)
LDL Cholesterol: 120 mg/dL — ABNORMAL HIGH (ref 0–99)

## 2010-10-17 LAB — CARDIAC PANEL(CRET KIN+CKTOT+MB+TROPI)
Relative Index: INVALID (ref 0.0–2.5)
Total CK: 92 U/L (ref 7–232)

## 2010-10-18 ENCOUNTER — Encounter (HOSPITAL_COMMUNITY): Payer: Medicare Other

## 2010-10-18 DIAGNOSIS — I214 Non-ST elevation (NSTEMI) myocardial infarction: Secondary | ICD-10-CM

## 2010-10-18 LAB — PRO B NATRIURETIC PEPTIDE: Pro B Natriuretic peptide (BNP): 685.6 pg/mL — ABNORMAL HIGH (ref 0–125)

## 2010-10-18 LAB — CBC
Platelets: 190 10*3/uL (ref 150–400)
RBC: 4.28 MIL/uL (ref 4.22–5.81)
WBC: 12.7 10*3/uL — ABNORMAL HIGH (ref 4.0–10.5)

## 2010-10-18 LAB — BASIC METABOLIC PANEL
Calcium: 9.3 mg/dL (ref 8.4–10.5)
Creatinine, Ser: 1.14 mg/dL (ref 0.50–1.35)
GFR calc non Af Amer: >60 mL/min (ref >60)
Sodium: 139 mEq/L (ref 135–145)

## 2010-10-18 LAB — HEPARIN LEVEL (UNFRACTIONATED): Heparin Unfractionated: 0.33 IU/mL (ref 0.30–0.70)

## 2010-10-19 LAB — CBC
MCH: 32.6 pg (ref 26.0–34.0)
Platelets: 173 10*3/uL (ref 150–400)
RBC: 3.87 MIL/uL — ABNORMAL LOW (ref 4.22–5.81)
RDW: 12.9 % (ref 11.5–15.5)
WBC: 10.3 10*3/uL (ref 4.0–10.5)

## 2010-10-19 LAB — BASIC METABOLIC PANEL
Calcium: 8.5 mg/dL (ref 8.4–10.5)
Creatinine, Ser: 1.3 mg/dL (ref 0.50–1.35)
GFR calc non Af Amer: 54 mL/min — ABNORMAL LOW (ref >60)
Glucose, Bld: 99 mg/dL (ref 70–99)
Sodium: 141 mEq/L (ref 135–145)

## 2010-10-19 LAB — HEPARIN LEVEL (UNFRACTIONATED): Heparin Unfractionated: 0.29 IU/mL — ABNORMAL LOW (ref 0.30–0.70)

## 2010-10-19 LAB — PROTIME-INR: INR: 1.12 (ref 0.00–1.49)

## 2010-10-20 LAB — BASIC METABOLIC PANEL WITH GFR
BUN: 21 mg/dL (ref 6–23)
CO2: 28 meq/L (ref 19–32)
Calcium: 8.7 mg/dL (ref 8.4–10.5)
Chloride: 103 meq/L (ref 96–112)
Creatinine, Ser: 1.17 mg/dL (ref 0.50–1.35)
GFR calc Af Amer: >60 mL/min
GFR calc non Af Amer: >60 mL/min
Glucose, Bld: 118 mg/dL — ABNORMAL HIGH (ref 70–99)
Potassium: 3.4 meq/L — ABNORMAL LOW (ref 3.5–5.1)
Sodium: 139 meq/L (ref 135–145)

## 2010-10-20 LAB — CBC
HCT: 39.7 % (ref 39.0–52.0)
Hemoglobin: 13.7 g/dL (ref 13.0–17.0)
MCH: 32.1 pg (ref 26.0–34.0)
MCHC: 34.5 g/dL (ref 30.0–36.0)
MCV: 93 fL (ref 78.0–100.0)
Platelets: 186 K/uL (ref 150–400)
RBC: 4.27 MIL/uL (ref 4.22–5.81)
RDW: 12.8 % (ref 11.5–15.5)
WBC: 13.6 K/uL — ABNORMAL HIGH (ref 4.0–10.5)

## 2010-10-20 LAB — GLUCOSE, CAPILLARY: Glucose-Capillary: 118 mg/dL — ABNORMAL HIGH (ref 70–99)

## 2010-10-20 NOTE — Consult Note (Signed)
NAMEKONSTANTINE, GERVASI NO.:  [id number]  MEDICAL RECORD NO.:  [id number]  LOCATION:  2922                         FACILITY:  [facility]  PHYSICIAN:  Sheliah Plane, MD    DATE OF BIRTH:  [birth year]  DATE OF CONSULTATION:  10/17/2010 DATE OF DISCHARGE:                                CONSULTATION   REQUESTING PHYSICIAN:  Dr. Shirlee Latch.  PRIMARY CARE DOCTOR:  Dr. Jeannetta Nap.  NEUROLOGIST:  Dr. Sandria Manly.  REASON FOR CONSULTATION:  Coronary artery occlusive disease, question suitability for coronary artery bypass grafting.  HISTORY OF PRESENT ILLNESS:  The patient is a 76 year old male with numerous medical problems with a history of right CVA, hypertension, obesity who had 3 days ago while at home began having substernal discomfort and heartburn.  He took some Zantac several weeks ago with relief and then the pain returned several days ago.  He has chronic dyspnea on exertion, has significant amount of difficulty ambulating. Because of the returned pain, he came to the emergency room.  Initial troponin was 0.15 and ultimately elevated to 1.35.  The patient has had a previous stroke affecting the left body in 2010. While seeing him, he attempted to walk to the bathroom with its significant obesity and limitations.  He can barely walk 8 feet without help.  His wife and he notes that he has a very sedentary lifestyle.  He is barely able to get around his house and notes that over the past 8 years has had 2 sons died, 1 of suicide and 1 of myocardial infarction while the patient was taking the son to the physician.  The patient notes that he is "given up since then.".  PAST MEDICAL HISTORY:  Significant for hypertension, hyperlipidemia.  He has a history of seizures disorder, history of bladder neck obstruction, history of gout, BPH.  PAST SURGICAL HISTORY:  Suprapubic prostatectomy in 2006.  FAMILY HISTORY AND SOCIAL HISTORY:  As noted, the patient's father died of lung  cancer.  Mother died of myocardial infarction.  Denies alcohol use, quit smoking many years ago.  MEDICATIONS:  Atenolol 100 mg a day, Plavix, Lasix, lisinopril, Dilantin and pravastatin.  CARDIAC DRUG ALLERGIES:  Unknown.  He notes that some drug why he had cataract surgery caused rash, but he was not sure what it was.  Cardiac review of systems is positive for chest pain, exertional shortness of breath, resting shortness of breath and lower extremity edema.  Denies palpitations.  Denies syncope or presyncope.  General review of systems is markedly positive as noted above.  The patient has barely able to get around his house secondary to obesity and degenerative joint disease.  He is chronically short of breath with and without exertion.  Denies any change in bowel habits.  Has residual weakness in his left side related to previous stroke, has difficulty urinating.  Today, the nurses have tried to place the Foley and I have called Dr. Retta Diones for consultation for urinary obstruction.  Denies any recent infections.  Psychiatric history is markedly positive noting the death of his 2 sons and the patient's blood pressure is 140/70, temperature is 98.5, heart rate is 62 and his BMI  is 45.74.  The patient weighs 370 pounds.  On exam, the patient appears chronically ill as noted trying to watch and walk less than 8 feet from the bathroom to his bed.  The patient took the use of both cane and 2 people helping him to move.  He has pedal edema.  Cardiac exam reveals regular rate and rhythm.  He has no murmur or gallop.  Abdominal exam shows obese abdomen, but without palpable masses.  Cardiac catheterization films were reviewed with Dr. Shirlee Latch.  The right coronary artery has luminal irregularities.  The LAD is chronically occluded and fills by collaterals from the right circumflex disease in the midportion that is significant.  IMPRESSION:  The patient with numerous comorbidities with  very limited lifestyle and ability to get around consideration for coronary artery bypass grafting.  From an anatomic standpoint at the LAD and circumflex vessels could be bypassed.  From her recovery standpoint, the patient has very limited functional ability now.  Let us consider other alternative treatments that although may not be ideal would be less disrupted to his current lifestyle.  It is unlikely that coronary artery bypass grafting is going to dramatically change his current situation. I will review this films further and options with Dr. Shirlee Latch.     Sheliah Plane, MD     EG/MEDQ  D:  10/17/2010  T:  10/18/2010  Job:  [id number]  Electronically Signed by Sheliah Plane MD on 10/20/2010 07:40:53 AM

## 2010-10-21 DIAGNOSIS — M79609 Pain in unspecified limb: Secondary | ICD-10-CM

## 2010-10-21 DIAGNOSIS — R0602 Shortness of breath: Secondary | ICD-10-CM

## 2010-10-21 LAB — CBC
Hemoglobin: 13.3 g/dL (ref 13.0–17.0)
MCH: 32.4 pg (ref 26.0–34.0)
Platelets: 179 10*3/uL (ref 150–400)
RBC: 4.1 MIL/uL — ABNORMAL LOW (ref 4.22–5.81)

## 2010-10-21 LAB — BASIC METABOLIC PANEL
CO2: 28 mEq/L (ref 19–32)
Calcium: 8.8 mg/dL (ref 8.4–10.5)
GFR calc non Af Amer: 50 mL/min — ABNORMAL LOW (ref >60)
Glucose, Bld: 107 mg/dL — ABNORMAL HIGH (ref 70–99)
Potassium: 3.6 mEq/L (ref 3.5–5.1)
Sodium: 142 mEq/L (ref 135–145)

## 2010-10-22 LAB — BASIC METABOLIC PANEL
BUN: 29 mg/dL — ABNORMAL HIGH (ref 6–23)
Chloride: 104 mEq/L (ref 96–112)
GFR calc Af Amer: >60 mL/min (ref >60)
GFR calc non Af Amer: >60 mL/min (ref >60)
Glucose, Bld: 152 mg/dL — ABNORMAL HIGH (ref 70–99)
Potassium: 3.4 mEq/L — ABNORMAL LOW (ref 3.5–5.1)
Sodium: 139 mEq/L (ref 135–145)

## 2010-10-23 ENCOUNTER — Encounter: Payer: Self-pay | Admitting: *Deleted

## 2010-10-23 LAB — BASIC METABOLIC PANEL
CO2: 27 mEq/L (ref 19–32)
Chloride: 105 mEq/L (ref 96–112)
GFR calc Af Amer: >60 mL/min (ref >60)
Potassium: 3.8 mEq/L (ref 3.5–5.1)

## 2010-10-24 NOTE — Cardiovascular Report (Signed)
  NAMEMarland Kaiser  LAVONTAE, CORNIA NO.:  [id number]  MEDICAL RECORD NO.:  [id number]  LOCATION:  2922                         FACILITY:  [facility]  PHYSICIAN:  Verne Carrow, MDDATE OF BIRTH:  1934/12/07  DATE OF PROCEDURE:  10/19/2010 DATE OF DISCHARGE:                           CARDIAC CATHETERIZATION   PRIMARY CARDIOLOGIST:  Marca Ancona, MD  PRIMARY CARE PHYSICIAN:  Windle Guard, MD  PROCEDURE PERFORMED:  PTCA with placement of a drug-eluting stent in the mid circumflex artery.  OPERATOR:  Verne Carrow, MD  INDICATIONS:  This is a 76 year old gentleman who was admitted to the hospital with a non-ST-elevation myocardial infarction.  Diagnostic catheterization was performed by Dr. Marca Ancona on October 17, 2010. The patient was found to have a totally occluded left anterior descending artery with severe disease in the circumflex artery.  The left anterior descending artery filled from collaterals from the right coronary artery.  Dr. Shirlee Latch had a surgical consultation for possible bypass surgery.  However, the patient was turned down for surgery.  It was felt that he would not do well postoperatively given his weight. Dr. Shirlee Latch asked me to perform intervention of the mid circumflex stenosis today.  PROCEDURE IN DETAIL:  The patient was brought to the main cardiac catheterization laboratory after signing informed consent for the procedure.  An Allen's test was performed on the right wrist and was positive.  The right wrist was prepped and draped in sterile fashion.  A 1% lidocaine was used for local anesthesia.  A 6-French sheath was inserted into the right radial artery without difficulty.  A 3 mg of verapamil was given through the sheath.  The patient was given a bolus of Angiomax and drip was started.  We then engaged the left main artery with a XB3.5 guiding catheter.  When ACT was greater than 200, I passed a cougar intracoronary wire down the  length of the circumflex artery.  A 2.5 x 15-mm balloon was used to predilate the lesion.  A 4.0 x to 18 mm Resolute Integrity drug-eluting stent was carefully positioned in the midvessel and was deployed without difficulty.  A 4.5 x 15-mm noncompliant balloon was then positioned inside the stent and was inflated to 15 atmospheres.  The stenosis was taken from 90% to 0%. There were no immediate complications.  There was excellent flow into the distal vessel.  The patient tolerated the procedure well.  He was taken to the recovery room in stable condition.  The sheath was removed here in the cath lab and Terumo hemostasis band was applied over the arteriotomy site.  IMPRESSION:  Successful percutaneous transluminal coronary angioplasty with placement of a drug-eluting stent in the mid circumflex artery.  RECOMMENDATIONS:  The patient will be continued on aspirin, Plavix, beta- blocker, and a statin.     Verne Carrow, MD     CM/MEDQ  D:  10/19/2010  T:  10/19/2010  Job:  [id number]  cc:   Marca Ancona, MD  Electronically Signed by Verne Carrow MD on 10/24/2010 03:16:18 PM

## 2010-10-24 NOTE — H&P (Addendum)
NAMEGERMAIN, KOOPMANN NO.:  [id number]  MEDICAL RECORD NO.:  [id number]  LOCATION:  2922                         FACILITY:  [facility]  PHYSICIAN:  Benjamin Ancona, MD      DATE OF BIRTH:  February 20, 1935  DATE OF ADMISSION:  10/16/2010 DATE OF DISCHARGE:                             HISTORY & PHYSICAL   PRIMARY CARDIOLOGIST:  The patient saw Dr. Deborah Kaiser remotely.  He will be followed up with Benjamin Kaiser from here on.  PRIMARY MEDICAL DOCTOR:  Benjamin Guard, MD  PRIMARY NEUROLOGIST:  Dr. Sandria Kaiser.  CHIEF COMPLAINTS:  Chest pain.  HISTORY OF PRESENT ILLNESS:  Benjamin Kaiser is a 76 year old gentleman with no history of coronary artery disease but has a history of CVA, hypertension, hyperlipidemia, remote DVT and morbid obesity.  He presented to the [facility] with complaints of chest discomfort and his first point-of-care cardiac markers showed troponin 0.15.  He reports an indigestion/heartburn sensation for the past 3 weeks for which he has been taking Zantac.  The night before last, he had chest pain while lying in bed and took Zantac with good relief.  Last night, however, he developed symptoms reminiscent of the night before, though somewhat more pronounced.  It starts as a pressure-like sensation, substernally radiating to the right shoulder, occasionally sharp at times.  He got up and moved around last night, but this made it worse. He has chronic shortness of breath and dyspnea on exertion and his wife states that he regularly gasp for air with any activity.  He lives a sedentary lifestyle and the only exertion he does is getting from the bed to the recliner.  Last time his chest discomfort eased off spontaneously but 5:00 a.m. he woke up with it again, with some diaphoresis, so he became concerned.  In the ER, troponin was found to be 0.15 at point-of-care.  EKG demonstrates normal sinus rhythm with right bundle branch block, left anterior fascicular block  and a long first-degree AV block with 1 dropped QRS complex as well as an old anteroseptal MI.  Aside from decrease in R-wave progression and lengthening of PR intervals, EKG is similar to June 2010.  He has been started on heparin and is currently pain free.  PAST MEDICAL HISTORY: 1. Remote syncopal event for which he saw Dr. Deborah Kaiser in 1991.  He was     told that he may have had an MI at that point, but was later told     him that it was a seizure.  His only other cardiac history includes     a negative nuclear stress test in August 2006. 2. Normal LV function by echo in June 2010 with normal EF 55-60% with     no wall motion abnormalities and mild aortic regurgitation.  3.     Peripheral vascular disease with 40-60% right internal carotid     artery stenosis and no significant LICA stenosis in June 2010     Dopplers. 3. Hypertension. 4. Seizures disorder remotely. 5. Hyperlipidemia. 6. Bradycardia secondary to atenolol. 7. Gout. 8. Obesity. 9. Right lower extremity DVT in 2003. 10.Bladder calculi status post cystoscopy. 11.BPH status post TURP.  12.Cataract surgery.  MEDICATIONS: 1. Allopurinol 300 mg daily. 2. Atenolol 100 mg daily. 3. Plavix 75 mg daily. 4. Lasix 40 mg. 5. Lisinopril 40 mg daily. 6. Phenytoin 100 mg 3 tablets in the morning and 2 tablets in the     evening. 7. Pravastatin 80 mg nightly.  ALLERGIES:  The patient is unsure.  He states he broke up from the medicine during cataract surgery but is not sure what the name of it was.  SOCIAL HISTORY:  Benjamin Kaiser lives in [place].  He is married with 1 living son.  He is smoking remotely and denies any alcohol use.  FAMILY HISTORY:  The patient's wife states that his mother may have had heart problems.  Father died of lung cancer.  He only has 1 out of 4 living sons.  One son committed suicide, but the other 2 died of MIs at age 50 and 102.  He has 1 brother who is being evaluated for syncope.  REVIEW OF  SYSTEMS:  He has fevers, chills.  He has chronic lower extremity edema, worse at the end of the day.  No bright red blood per rectum, melena, or hematemesis.  All other systems reviewed and otherwise negative.  LABORATORY DATA:  WBC 8.8, hemoglobin 14.7, hematocrit 42.4, platelet count 182.  Sodium 141, potassium 4, chloride 104, CO2 26, glucose 122, BUN 20, creatinine 1.05, lipase 25, troponin 0.15 at point-of-care. LFTs are okay with the exception of albumin 3.0.  EKG; normal sinus rhythm, right bundle branch block versus AV block, let anterior fascicular block and old anteroseptal MI.  There is a dropped QRS.  Radiologic studies.  Chest x-ray showed suboptimal inspiration accounts for mild atelectasis in the lower lobe.  No active cardiopulmonary disease.  PHYSICAL EXAMINATION:  VITAL SIGNS:  Temperature 98.4, pulse 54, respirations 14, blood pressure 140/72, pulse oxygen 100% on 3 L. GENERAL:  Obese white male in no acute distress. HEENT:  Normocephalic, atraumatic with extraocular movements intact. Clear sclerae.  Nares are without discharge. NECK:  Supple without carotid bruits. HEART:  Auscultation of heart reveals slow but regular rhythm without murmurs, rubs, or gallops.  Lungs had decreased breath sounds throughout without wheezes, rales or rhonchi. ABDOMEN:  Soft, nontender, nondistended.  Positive bowel sounds.  No rebound or guarding.  It is obese. EXTREMITIES:  Warm and dry with 1-2+ left lower extremity edema bilaterally.  Decreased pedal pulses bilaterally. NEUROLOGIC:  He is alert and oriented x3 and responds to questions appropriately, but with flat affect and is very hard-of-hearing.  ASSESSMENT/PLAN:  The patient was seen and examined by Dr. Shirlee Latch and myself.  This is a 76 year old gentleman with no known history of coronary artery disease with a history of cerebrovascular accident, peripheral vascular disease, hypertension and hyperlipidemia, prior  deep vein thrombosis and morbid obesity who presented with chest pain with the first point-of-care markers that is positive with a troponin of 0.15. 1. NSTEMI.  The patient is currently chest pain free.  We will add     aspirin and continue with heparin drip.  We will start a nitro drip     at 20 and titrate to keep his systolic pressure less than 140.     Pravastatin will be changed to Lipitor.  We will continue Plavix.     He is not a candidate for prasugrel given his history of CVA in     2010.  Enzymes will be cycled.  We have set up him for left  heart catheterization in the morning if he stays chest pain free.     We will use the radial approach.  We will also check a 2-D     echocardiogram. 2. Bradycardia/profound first-degree AV block.  He also dropped to be     on his EKG.  We will decrease his beta-blocker dose from atenolol     100 to metoprolol 25 mg q.8 h. with hold parameters.  This will be     watched on telemetry for any higher grade heart blocks.     Ronie Spies, P.A.C.   ______________________________ Benjamin Ancona, MD    DD/MEDQ  D:  10/16/2010  T:  10/16/2010  Job:  [id number]  cc:   Dr. Jeannetta Nap Dr. Sandria Kaiser  Electronically Signed by Ronie Spies  on 10/24/2010 07:01:17 PM Electronically Signed by Benjamin Ancona MD on 11/11/2010 11:05:32 PM

## 2010-10-24 NOTE — Discharge Summary (Addendum)
Benjamin Kaiser, Benjamin Kaiser NO.:  [id number]  MEDICAL RECORD NO.:  [id number]  LOCATION:  2014                         FACILITY:  [facility]  PHYSICIAN:  Marca Ancona, MD      DATE OF BIRTH:  05-10-1934  DATE OF ADMISSION:  10/16/2010 DATE OF DISCHARGE:  10/23/2010                              DISCHARGE SUMMARY   PRIMARY DISCHARGE DIAGNOSES: 1. Non-ST-elevated myocardial infarction.     a.     Diagnostic catheterization October 17, 2010, not felt to be a      good coronary artery bypass grafting.     b.     Status post percutaneous transluminal coronary angioplasty      with placement of drug-eluting stent in the mid circumflex artery      on October 19, 2010. 2. Acute on chronic diastolic heart failure.     a.     Ejection fraction of 45% to 50% with grade 1 diastolic      dysfunction by echo on October 16, 2010. 3. Urinary retention with Foley in place at discharge per Urology, is     to follow up with Dahlstedt.     a.     History of benign prostatic hypertrophy status post      transurethral resection of the prostate. 4. Bradycardia, improved after switch from atenolol to metoprolol. 5. First-degree atrioventricular block.  SECONDARY DIAGNOSES: 1. Hypertension. 2. Seizure disorder remotely. 3. Hyperlipidemia. 4. Gout. 5. Obesity. 6. Right lower extremity deep venous thrombosis in 2003.     a.     Lower extremity Dopplers October 20, 2010, negative for deep      venous thrombosis. 7. Bladder calculi status post cystoscopy. 8. Cataract surgery.  HOSPITAL COURSE:  Benjamin Kaiser is a 76 year old gentleman with a history of hypertension, hyperlipidemia, and normal LV function in June 2010. He saw Dr. Deborah Chalk in 1991 for remote syncopal event that was later deemed to be relative seizures.  He presented to [facility] with complaints of  indigestion and heartburn sensation, radiating to the right shoulder, occasionally sharp, but wife stated that he  had chronic dyspnea on exertion as well.  In the ER, troponin was found to be 0.15 with point-of-care.  EKG demonstrated normal sinus rhythm with right bundle-branch block, left anterior fascicular block, and a long first-degree AV block with one dropped QRS complex.  These symptoms were felt consternating for acute coronary syndrome and he was admitted to the hospital and cardiac enzymes were cycled.  They demonstrated peak troponin of 1.35, forcibly ruling him in for an NSTEMI.  He underwent cardiac catheterization on October 17, 2010, which demonstrated severe circumflex disease with total occlusion in the LAD and collaterals.  The distal LAD was supplied by the RCA.  The LV and diastolic heart pressure were significantly elevated, so no LV gram was done.  He was set up for an echo, which demonstrated the findings below.  Because of his anatomy, he was seen in consultation by Dr. Tyrone Sage and was ultimately felt to be a less than ideal candidate for bypass surgery.  Dr. Tyrone Sage stated that it was unlikely that CABG would dramatically change  his current situation.  Films were reviewed with by Dr. Shirlee Latch and Dr. Tyrone Sage and ultimately the patient underwent planned PTCA with placement of drug- eluting stent to the mid circumflex artery.  He was continued on his aspirin and Plavix.  Beta-blocker had since been changed to metoprolol with improvement in his bradycardia.  Statin was changed to Lipitor 80 mg.  The patient tolerated the procedure well.  During this hospitalization, he also had volume overload felt mainly due to diastolic dysfunction and was diuresed with improvement in symptoms. He also developed a degree of urinary retention prior to Foley placement by Urology, who recommended that he leave the Foley in and follow up with them in the next coming week and they have discharged the patient feeling well.  Dr. Shirlee Latch has seen and examined him today and feels he is stable for  discharge.  DISCHARGE LABS:  WBC 8.8, hemoglobin 13.3, hematocrit 38.2, platelet count 179.  Sodium 139, potassium 2.8, chloride 105, CO2 of 27, glucose 100, BUN 26, creatinine 1.05, PRU was 224, peak troponin 1.35, total cholesterol 2.8, triglycerides 245, HDL 45, LDL 120, TSH 0.804.  STUDIES: 1. Chest x-ray on October 16, 2010, shows suboptimal inspiration     accounting for mild atelectasis in the lower lobes.  No acute     cardiopulmonary disease otherwise. 2. Diagnostic and interventional catheterization on October 17, 2010,     and October 19, 2010, please see full report for details as well as     H and P for summary. 3. A 2-D echocardiogram October 16, 2010, demonstrated LVH that was     mild.  EF was 45% to 50%.  Anteroseptal hypokinesis.  Grade 1     diastolic dysfunction.  Aortic was without stenosis.  Trivial MR.     LA was mildly elevated.  RV cavity size and systolic function were     normal.  DISCHARGE MEDICATIONS: 1. Lipitor 80 mg at bedtime. 2. Imdur 30 daily. 3. Metoprolol succinate 50 mg daily. 4. Nitroglycerin sublingual 0.4 mg every 5 minutes as needed up to 3     doses for chest pain. 5. Potassium chloride 20 mEq 2 tablets daily. 6. Aspirin 81 mg daily. 7. Lasix 40 mg 1 tablet in morning and half tablet in evening. 8. Allopurinol 300 mg daily. 9. Artificial Tears 1 drop both eyes b.i.d. 10.Plavix 75 mg daily. 11.Dilantin 0.2 mg 3 capsules in the morning and 2 capsules in the     evening. 12.Lisinopril 40 mg daily. 13.Niacin SR 500 mg daily.  Please note his atenolol was changed to metoprolol this admission and Pravachol was changed to Lipitor because of his lipid panel.  DISPOSITION:  Benjamin Kaiser will be discharged in stable condition to home. He is not to lift anything over 5 pounds for 4 days, drive for 2 days, or participate in sexual activity for 4 days.  He should follow a low- sodium heart-healthy diet and report his daily weight, notify our  office with increasing symptoms.  He is to call or return for pain, swelling, bleeding, or pus at his cath site.  He will follow up with Dr. Shirlee Latch on October 31, 2010, at 8:30 a.m.  Dr. Lenoria Chime office would like him to return on November 03, 2010, at 1 p.m.  He also has home health services set up to advance home care.  DURATION OF DISCHARGE ENCOUNTER:  Greater than 30 minutes including physician and PA time.     Ronie Spies, P.A.C.  ______________________________ Marca Ancona, MD    DD/MEDQ  D:  10/23/2010  T:  10/23/2010  Job:  [id number]  cc:   Bertram Millard. Dahlstedt, M.D. Windle Guard, M.D.  Electronically Signed by Ronie Spies  on 10/24/2010 07:01:21 PM Electronically Signed by Marca Ancona MD on 11/11/2010 11:05:26 PM

## 2010-10-24 NOTE — Consult Note (Signed)
Benjamin Kaiser, EID NO.:  [id number]  MEDICAL RECORD NO.:  [id number]  LOCATION:  2922                         FACILITY:  [facility]  PHYSICIAN:  Bertram Millard. Xoey Warmoth, M.D.DATE OF BIRTH:  14-Feb-1935  DATE OF CONSULTATION:  10/17/2010 DATE OF DISCHARGE:                                CONSULTATION   REASON FOR CONSULTATION:  Difficult Foley catheter placement.  HISTORY OF PRESENT ILLNESS:  This is a 76 year old gentleman who was admitted for workup and evaluation of chest pain without relief on October 16, 2010.  Cardiac catheterization was performed today and reveals the need for a coronary artery bypass grafting.  Nursing staff attempted placement of indwelling Foley catheter for his cardiac catheterization procedure today without success x2.  The patient denies any complaints of urinary urgency, hematuria or dysuria as related to his urologic history.  He does complain of nocturia and urinary frequency with occasional inability to completely empty his bladder which has been ongoing.  Urological history is significant for nephrolithiasis, bladder calculi, bladder neck contracture and BPH in which he is status post suprapubic prostatectomy in 2006 per Dr. Lynnda Shields.  He has required cystoscopy with dilation of bladder neck contracture in the past.  He was last seen on an outpatient basis per Dr. Lenn Sink in February 2009.  He had no new complaints at that time.  Today, he does have complaints of occasional chest pain with release of urinary frequency and dysuria as well as headache.  He has no complaints of shortness of breath, fever, chills, nausea, vomiting, diarrhea.  He denies any complaints of hematuria, dysuria or urinary urgency.  PAST MEDICAL HISTORY: 1. Hypertension. 2. Remote syncopal episode. 3. Normal LV function with echocardiogram in 2012, ejection fraction     55-60%. 4. Seizure disorder. 5. Hyperlipidemia. 6. Bradycardia 7.  Gout. 8. Obesity. 9. CVA. 10.Right lower extremity DVT. 11.Bladder calculi. 12.BPH. 13.Nephrolithiasis.  PAST SURGICAL HISTORY: 1. Cardiac catheterization. 2. Suprapubic prostatectomy. 3. Cystolithotomy 4. Cataract surgery.  MEDICATIONS: 1. Allopurinol 2. Atenolol 3. Plavix. 4. Lasix. 5. Lisinopril. 6. Dilantin 7. Pravastatin. 8. Aspirin. 9. Fentanyl. 10.Lipitor.  FAMILY HISTORY:  The patient's father is deceased of lung cancer.  His mother is significant for coronary artery disease.  He has 2 sons in which are deceased of due to MIs in their 73s.  SOCIAL HISTORY:  He is married and lives [place] his [place]  He has 3 sons, 2 of which are deceased from MI and one who is deceased from suicide.  He has 1 living son.  He has a remote history of tobacco use.  He denies any recent tobacco use or alcohol use.  REVIEW OF SYSTEMS:  As stated per HPI.  PHYSICAL EXAMINATION:  VITAL SIGNS:  Temperature 98.2, pulse 60, respirations 20, blood pressure 150/77. CONSTITUTIONAL:  He is a well-developed obese white male in no acute distress for having complaining of chest pain at this time. HEENT:  Normocephalic, atraumatic.  Oropharynx is clear. ABDOMEN:  Obese, soft, nontender with positive bowel sounds. GU:  Penis is uncircumcised without lesion or mass.  There are bilateral descended testes without lesion or mass. RECTAL:  Rectal  was deferred secondary to his requirement of lying flat due to his cardiac catheterization. EXTREMITIES:  There is positive pedal edema 1+ pitting, no atrophy. NEURO:  Remote and recent memory is intact. SKIN:  Warm and dry, intact.  LABORATORY FINDINGS:  Sodium is 142, potassium 3.9, chloride 106, CO2 28, BUN 18, creatinine 0.92, glucose 107. WBC is 9.2, hemoglobin 13.8, hematocrit 39.7, platelets 180.  PROCEDURE:  The glans penis was cleansed with Betadine solution.  A 14- French Coude indwelling Foley catheter was placed  within urinary meatus and advanced using sterile technique.  Resistance was met at the bladder neck.  There was no urine return and inability to pass the Foley through the bladder.  IMPRESSION/PLAN:  Bladder neck contracture.  Unable to pass indwelling Foley catheter completely into bladder.  Dr. Lenn Sink will be bilayer to perform dilation of this bladder neck contracture as he has required dilation in the past for.  He will provide placement of indwelling Foley catheter at that time.  He may leave Foley catheter in as long as medically required.  He will dictate his procedure later this afternoon.     Delia Chimes, NP   ______________________________ Bertram Millard. Laurelle Skiver, M.D.    MA/MEDQ  D:  10/17/2010  T:  10/17/2010  Job:  [id number]  Electronically Signed by Delia Chimes NP on 10/23/2010 02:01:05 PM Electronically Signed by Marcine Matar M.D. on 10/24/2010 02:40:26 PM

## 2010-10-27 ENCOUNTER — Encounter: Payer: Self-pay | Admitting: Cardiology

## 2010-10-31 ENCOUNTER — Ambulatory Visit (INDEPENDENT_AMBULATORY_CARE_PROVIDER_SITE_OTHER): Payer: Medicare Other | Admitting: Cardiology

## 2010-10-31 ENCOUNTER — Encounter: Payer: Self-pay | Admitting: Cardiology

## 2010-10-31 DIAGNOSIS — I2510 Atherosclerotic heart disease of native coronary artery without angina pectoris: Secondary | ICD-10-CM | POA: Insufficient documentation

## 2010-10-31 DIAGNOSIS — R0602 Shortness of breath: Secondary | ICD-10-CM

## 2010-10-31 DIAGNOSIS — R339 Retention of urine, unspecified: Secondary | ICD-10-CM | POA: Insufficient documentation

## 2010-10-31 DIAGNOSIS — E785 Hyperlipidemia, unspecified: Secondary | ICD-10-CM

## 2010-10-31 DIAGNOSIS — I509 Heart failure, unspecified: Secondary | ICD-10-CM

## 2010-10-31 DIAGNOSIS — I5032 Chronic diastolic (congestive) heart failure: Secondary | ICD-10-CM

## 2010-10-31 LAB — BASIC METABOLIC PANEL
CO2: 23 mEq/L (ref 19–32)
Chloride: 104 mEq/L (ref 96–112)
Glucose, Bld: 112 mg/dL — ABNORMAL HIGH (ref 70–99)
Sodium: 138 mEq/L (ref 135–145)

## 2010-10-31 NOTE — Patient Instructions (Signed)
Your physician recommends that you have lab work today--BMP/BNP  414.01  428.32  Your physician recommends that you schedule a follow-up appointment in: 1 month with Dr Shirlee Latch.  You can transition from home PT to Cardiac Rehab at [facility] when you feel like you are ready.

## 2010-10-31 NOTE — Assessment & Plan Note (Signed)
Lipids/LFTs in 2 months.

## 2010-10-31 NOTE — Assessment & Plan Note (Addendum)
Stable s/p PCI to CFX.  Also has chronic total occlusion of LAD with collaterals from the RCA.  Patient was seen by Dr. Tyrone Sage and deemed a poor CABG candidate due to obesity and poor functional status.  He has had no chest pain since discharge.  - Continue ASA 81, Plavix, Imdur, ACEI, Toprol XL, and statin.  - Currently getting home PT, would like him to start cardiac rehab after he completes home PT.  Increased physical activity and weight loss is going to be imperative.

## 2010-10-31 NOTE — Progress Notes (Signed)
PCP: Dr. Jeannetta Nap  76 yo with history of morbid obesity, diastolic CHF, and CAD presents to establish outpatient followup.  Patient presented to [facility] with chest pain in 8/12 and was found to have NSTEMI.  Left heart cath showed a total occlusion of the proximal LAD with collaterals from the RCA.  There were serial 90% stenoses in the circumflex.  He was evaluated for CABG but was turned down due to obesity and poor functional status.  He had a drug-eluting stent placed in the CFX and the LAD was not intervened upon (chronic total occlusion).  He had urinary retention in the hospital and it was difficult to insert a foley.  Eventually, urology placed a foley, which is still in.  He follows up with urology on Friday.    He has had no chest pain since discharge.  He is short of breath after walking 75-80 feet on flat ground, which seems to be similar compared to prior to admission.  He walks in the house 3-4 times a day.  He is getting physical therapy at home.    ECG: NSR, 1st degree AV block PR 236 msec, borderline RBBB, poor anterior R wave progression.   Labs (8/12): K 3.8, creatinine 1.05  PMH: 1. CVA 2. HTN 3. Right lower leg DVT in 2003 4. Obesity 5. CAD: NSTEMI (8/12).  LHC with total occlusion LAD and collaterals from RCA, 90% serial CFX stenoses.  Patient was evaluated for CABG but was turned down by CVTS given obesity and poor functional status.  He had DES to CFX placed instead.   6. Diastolic CHF: Echo (8/12) with EF 45-50%, anteroseptal hypokinesis, mild LV hypertrophy, normal RV, aortic sclerosis.  7. Hyperlipidemia 8. Gout 9. Seizure disorder 10. Bladder calculi 11. BPH  SH: Lives in [place], married.  1 living son.  Prior smoker.   FH: 2 sons died of MIs in their 38s.  Another son committed suicide.    ROS: All systems reviewed and negative except as per HPI.   Current Outpatient Prescriptions  Medication Sig Dispense Refill  . allopurinol (ZYLOPRIM) 300 MG tablet Take  300 mg by mouth daily.        Marland Kitchen aspirin 81 MG tablet Take 81 mg by mouth daily.        Marland Kitchen atorvastatin (LIPITOR) 80 MG tablet Take 80 mg by mouth daily.        . clopidogrel (PLAVIX) 75 MG tablet Take 75 mg by mouth daily.        . furosemide (LASIX) 40 MG tablet 1 tab am 1/2 tab pm      . isosorbide mononitrate (IMDUR) 30 MG 24 hr tablet Take 30 mg by mouth daily.        Marland Kitchen lisinopril (PRINIVIL,ZESTRIL) 40 MG tablet Take 40 mg by mouth daily.        . metoprolol (TOPROL-XL) 50 MG 24 hr tablet Take 50 mg by mouth daily.        . niacin (NIASPAN) 500 MG CR tablet Take 500 mg by mouth at bedtime.        . nitroGLYCERIN (NITROSTAT) 0.4 MG SL tablet Place 0.4 mg under the tongue every 5 (five) minutes as needed.        . phenytoin (DILANTIN) 100 MG ER capsule 3 tabs morning and 2 tabs pm      . potassium chloride SA (K-DUR,KLOR-CON) 20 MEQ tablet 2 tabs po qd         BP 124/70  Pulse 73  Resp 16  Ht 6\' 2"  (1.88 m)  Wt 350 lb (158.759 kg)  BMI 44.94 kg/m2 General: Obese, NAD Neck: No JVD, no thyromegaly or thyroid nodule.  Lungs: Clear to auscultation bilaterally with normal respiratory effort. CV: Nondisplaced PMI.  Heart regular S1/S2, no S3/S4, no murmur.  1+ ankle edema.  No carotid bruit.  Normal pedal pulses.  Abdomen: Soft, nontender, no hepatosplenomegaly, no distention.  Skin: Intact without lesions or rashes.  Neurologic: Alert and oriented x 3.  Psych: Normal affect. Extremities: No clubbing or cyanosis.  HEENT: Normal.

## 2010-10-31 NOTE — Assessment & Plan Note (Signed)
EF 45-50% on echo in the hospital.  Primarily diastolic CHF.  NYHA class III symptoms that are also certainly due partially to obesity and deconditioning.  Volume status looks ok on exam today.  Continue current Lasix dosing.  Needs BMET/BNP today.

## 2010-10-31 NOTE — Assessment & Plan Note (Signed)
He sees his urologist on Friday.  Hopefully will get foley out.

## 2010-11-01 ENCOUNTER — Telehealth: Payer: Self-pay | Admitting: *Deleted

## 2010-11-01 DIAGNOSIS — I5032 Chronic diastolic (congestive) heart failure: Secondary | ICD-10-CM

## 2010-11-01 NOTE — Telephone Encounter (Signed)
Notes Recorded by Jacqlyn Krauss, RN on 11/01/2010 at 11:51 AM I talked with pt's wife and she verbalized understanding. Pt's wife states PT comes 2 x week and they check pt's BP. Pt will return for bmet 11/10/10 and wife will bring a record of pt's BP when pt returns for bmet 11/10/10. ------  Notes Recorded by Marca Ancona, MD on 10/31/2010 at 5:37 PM Decrease Lasix to 40 mg once daily. Decrease lisinopril to 20 mg daily. Check BP on home cuff daily and call to get BP numbers in 10 days. BMET again in 10 days.

## 2010-11-10 ENCOUNTER — Telehealth: Payer: Self-pay | Admitting: *Deleted

## 2010-11-10 ENCOUNTER — Other Ambulatory Visit (INDEPENDENT_AMBULATORY_CARE_PROVIDER_SITE_OTHER): Payer: Medicare Other | Admitting: *Deleted

## 2010-11-10 DIAGNOSIS — I5023 Acute on chronic systolic (congestive) heart failure: Secondary | ICD-10-CM

## 2010-11-10 DIAGNOSIS — I2510 Atherosclerotic heart disease of native coronary artery without angina pectoris: Secondary | ICD-10-CM

## 2010-11-10 DIAGNOSIS — I509 Heart failure, unspecified: Secondary | ICD-10-CM

## 2010-11-10 DIAGNOSIS — I5032 Chronic diastolic (congestive) heart failure: Secondary | ICD-10-CM

## 2010-11-10 LAB — BASIC METABOLIC PANEL
BUN: 49 mg/dL — ABNORMAL HIGH (ref 6–23)
CO2: 18 mEq/L — ABNORMAL LOW (ref 19–32)
Chloride: 105 mEq/L (ref 96–112)
Glucose, Bld: 105 mg/dL — ABNORMAL HIGH (ref 70–99)
Potassium: 5 mEq/L (ref 3.5–5.1)
Sodium: 134 mEq/L — ABNORMAL LOW (ref 135–145)

## 2010-11-10 NOTE — Telephone Encounter (Signed)
See lab results 11/10/10

## 2010-11-16 ENCOUNTER — Telehealth: Payer: Self-pay | Admitting: Cardiology

## 2010-11-16 NOTE — Telephone Encounter (Signed)
I talked with pt's wife. Pt has appt with Dr Retta Diones tomorrow and was told by his office that BMET could be drawn there. I have faxed an order to Dr Retta Diones  [phone] for a BMET. I have also called his office to let them know I am faxing an order for a BMET to be done tomorrow 11/17/10 at time of appt with Dr Retta Diones.

## 2010-11-16 NOTE — Telephone Encounter (Signed)
Pt's wife wanted to know if order can be sent to dr dalstadt at  [facility] urology for blood work so he doesn't have to make two trips please call

## 2010-11-17 ENCOUNTER — Telehealth: Payer: Self-pay | Admitting: *Deleted

## 2010-11-17 ENCOUNTER — Other Ambulatory Visit: Payer: Medicare Other | Admitting: *Deleted

## 2010-11-17 ENCOUNTER — Telehealth: Payer: Self-pay | Admitting: Cardiology

## 2010-11-17 NOTE — Telephone Encounter (Signed)
Prescription for BMET re- faxed to [facility]. Left a message  to call back if any questions.

## 2010-11-17 NOTE — Telephone Encounter (Signed)
9/21--refaxed order for BMET to dr Simeon Craft office--nr

## 2010-11-17 NOTE — Telephone Encounter (Signed)
Per Junious Dresser, the fax came over on rx pad. Please re-send unable to read.

## 2010-11-20 ENCOUNTER — Telehealth: Payer: Self-pay | Admitting: Cardiology

## 2010-11-20 ENCOUNTER — Encounter: Payer: Self-pay | Admitting: Cardiology

## 2010-11-20 NOTE — Telephone Encounter (Addendum)
Walk In Pt Form " Pt Dropped off " Physicians Statement " to be completed, put in [facility] box ( he will return 11/21/10)  11/20/10/km  Dr.McLean signed/Completed Physicians Statement Form pt will pick up today  12/07/10/km

## 2010-11-21 ENCOUNTER — Telehealth: Payer: Self-pay | Admitting: *Deleted

## 2010-11-21 NOTE — Telephone Encounter (Signed)
Notes Recorded by Jacqlyn Krauss, RN on 11/21/2010 at 12:24 PM I discussed with pt's wife. Pt will stop KCL supplement. We discussed low K diet. Notes Recorded by Marca Ancona, MD on 11/21/2010 at 12:08 AM K is upper normal, follow low K diet and discontinue any supplement.

## 2010-11-26 NOTE — Cardiovascular Report (Signed)
NAMEMYNOR, WITKOP NO.:  [id number]  MEDICAL RECORD NO.:  [id number]  LOCATION:  2922                         FACILITY:  [facility]  PHYSICIAN:  Marca Ancona, MD      DATE OF BIRTH:  Apr 01, 1934  DATE OF PROCEDURE:  10/17/2010 DATE OF DISCHARGE:                           CARDIAC CATHETERIZATION   PROCEDURE.: 1. Left heart catheterization. 2. Coronary angiography 3. Left ventriculography.  INDICATIONS:  Non-ST-elevation MI and the patient with strong family history of coronary disease, morbid obesity, and prior history of stroke.  PROCEDURE NOTE:  After informed consent was obtained, the patient underwent Allen testing on his right wrist.  The right radial artery gave good collateral circulation of the radial side of the hand.  The patient was then sterilely prepped and draped.  1% Lidocaine used to locally to anesthetize right radial area.  The right radial artery was entered using modified Seldinger technique and a 5-French arterial sheath was placed.  The patient then received 3 mg intraarterial verapamil and 4000 units IV heparin.  The right coronary artery was engaged using the 3-D RCA catheter.  The left coronary artery was engaged using the JL-3.5 catheter and left ventricle was entered using angled pigtail catheter.  There were no complications.  FINDINGS: 1. Hemodynamics, LV 174/34, aorta 170/96. 2. Left ventriculography was not done as the patient had a very     elevated left ventricular end-diastolic pressure of 34 mmHg.  We     will do an echo instead. 3. Right coronary artery.  The right coronary artery was     a large dominant vessel.  There was 30% proximal RCA stenosis and     about 40-50% mid RCA stenosis.  The RCA gave collaterals to the     distal LAD. 4. Left main.  Left main had 30% distal vessel stenosis. 5. Left circumflex system.  There was a small first obtuse marginal     that was totally occluded in the proximal segment.  In  the mid     circumflex after the takeoff of the first obtuse marginal there was a     long up to 90% stenosis.  This was followed by a large second     obtuse marginal.  Following the large second obtuse marginal, there     was a long up to 90% stenosis in the AV circumflex and extending     into a moderate-sized PLOM. 6. LAD system.  The LAD had a 90% proximal stenosis just after the     takeoff of small first diagonal, the small first diagonal had 95%     ostial stenosis. The mid LAD was totally occluded.  The mid-to-distal LAD did reconstitute via collaterals     from the right coronary artery.  IMPRESSION:  The patient has severe circumflex system disease with total occlusion of the LAD and collaterals.  The distal LAD is supplied by the RCA.  The left ventricular end diastolic pressure is significantly elevated, so we did not do a left ventriculogram.  We will plan an echo to assess LV systolic function today.  We will get a CVTS consult for potential bypass surgery.  The patient will need to stop Plavix.  He has been on this for stroke.  We will check P12 test in the morning.  The patient will need IV diuresis.  We will restart IV heparin drip in 6 hours.  The patient is of note now chest pain free.     Marca Ancona, MD     DM/MEDQ  D:  10/17/2010  T:  10/17/2010  Job:  [id number]  cc:   Windle Guard, M.D.  Electronically Signed by Marca Ancona MD on 11/26/2010 11:21:18 PM

## 2010-11-29 ENCOUNTER — Ambulatory Visit: Payer: Medicare Other | Admitting: Cardiology

## 2010-11-30 ENCOUNTER — Ambulatory Visit: Payer: Medicare Other | Admitting: Cardiology

## 2010-12-01 ENCOUNTER — Encounter: Payer: Self-pay | Admitting: Physician Assistant

## 2010-12-01 ENCOUNTER — Ambulatory Visit (INDEPENDENT_AMBULATORY_CARE_PROVIDER_SITE_OTHER): Payer: Medicare Other | Admitting: Physician Assistant

## 2010-12-01 DIAGNOSIS — I509 Heart failure, unspecified: Secondary | ICD-10-CM

## 2010-12-01 DIAGNOSIS — I5032 Chronic diastolic (congestive) heart failure: Secondary | ICD-10-CM

## 2010-12-01 DIAGNOSIS — R0602 Shortness of breath: Secondary | ICD-10-CM

## 2010-12-01 DIAGNOSIS — I10 Essential (primary) hypertension: Secondary | ICD-10-CM

## 2010-12-01 DIAGNOSIS — E785 Hyperlipidemia, unspecified: Secondary | ICD-10-CM

## 2010-12-01 DIAGNOSIS — R0600 Dyspnea, unspecified: Secondary | ICD-10-CM

## 2010-12-01 DIAGNOSIS — I2510 Atherosclerotic heart disease of native coronary artery without angina pectoris: Secondary | ICD-10-CM

## 2010-12-01 LAB — BASIC METABOLIC PANEL
BUN: 15 mg/dL (ref 6–23)
CO2: 26 mEq/L (ref 19–32)
Chloride: 104 mEq/L (ref 96–112)
Creatinine, Ser: 1.1 mg/dL (ref 0.4–1.5)
Glucose, Bld: 143 mg/dL — ABNORMAL HIGH (ref 70–99)

## 2010-12-01 NOTE — Assessment & Plan Note (Addendum)
Overall, volume appears stable.  Suspect most of his shortness of breath is related to deconditioning.  He is currently not interested in cardiac rehabilitation due to cost.  I will check a basic metabolic panel today and a BNP.  If his creatinine and potassium are stable, consider putting him back on a low dose of lisinopril to see if he can tolerate this.  May need to hold off if K+ remains high normal.  Followup with Dr. Shirlee Latch in 2 months.

## 2010-12-01 NOTE — Progress Notes (Signed)
History of Present Illness: Primary Cardiologist:  Dr. 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 male with history of morbid obesity, diastolic CHF, and CAD presents for follow up.  Patient presented to [facility] with chest pain in 8/12 and was found to have NSTEMI.  Left heart cath showed a total occlusion of the proximal LAD with collaterals from the RCA.  There were serial 90% stenoses in the circumflex.  He was evaluated for CABG but was turned down due to obesity and poor functional status.  He had a drug-eluting stent placed in the CFX and the LAD was not intervened upon (chronic total occlusion).  He had urinary retention in the hospital and it was difficult to insert a foley.  Eventually, urology placed a foley.    Last seen one month ago.  Since that time, he had elevated creatinine and K+ along with low BP measurements.  Adjustments were made in his Lasix.  K+ was held and his Lisinopril was held due to low BPs.  He denies any chest pain.  He occasionally has palpitations that he describes as a hard heartbeat.  He denies symptoms reminiscent of his previous angina.  He continues to have class III shortness of breath.  He denies orthopnea, PND or edema.  He denies syncope.  He has completed home health physical therapy.  He was apparently evaluated for home oxygen his oxygen levels were normal.  Labs (8/12): K 3.8, creatinine 1.05 Labs (9/12): K 4.8 >>> 5.0 >>> 5.2;   Creatinine 1.7 >>> 2.3 >>> 1.12  Past Medical History: 1. CVA 2. HTN 3. Right lower leg DVT in 2003 4. Obesity 5. CAD: NSTEMI (8/12).  LHC with total occlusion LAD and collaterals from RCA, 90% serial CFX stenoses.  Patient was evaluated for CABG but was turned down by CVTS given obesity and poor functional status.  He had DES to CFX placed instead.   6. Diastolic CHF: Echo (8/12) with EF 45-50%, anteroseptal hypokinesis, mild LV hypertrophy, normal RV, aortic sclerosis.  7. Hyperlipidemia 8. Gout 9.  Seizure disorder 10. Bladder calculi 11. BPH   Current Outpatient Prescriptions  Medication Sig Dispense Refill  . allopurinol (ZYLOPRIM) 300 MG tablet Take 300 mg by mouth daily.        Marland Kitchen aspirin 81 MG tablet Take 81 mg by mouth daily.        Marland Kitchen atorvastatin (LIPITOR) 80 MG tablet Take 80 mg by mouth daily.        . clopidogrel (PLAVIX) 75 MG tablet Take 75 mg by mouth daily.        . furosemide (LASIX) 40 MG tablet 20mg  --one-half 40mg  tablet daily      . isosorbide mononitrate (IMDUR) 30 MG 24 hr tablet Take 30 mg by mouth daily.        . metoprolol (TOPROL-XL) 50 MG 24 hr tablet Take 50 mg by mouth daily.        . niacin (NIASPAN) 500 MG CR tablet Take 500 mg by mouth at bedtime.        . nitroGLYCERIN (NITROSTAT) 0.4 MG SL tablet Place 0.4 mg under the tongue every 5 (five) minutes as needed.        . phenytoin (DILANTIN) 100 MG ER capsule Take 100 mg by mouth. Taking 3 Tablet in A.M and 2 in P.M       . Tamsulosin HCl (FLOMAX PO) Take by mouth. Taking 2 Capsule B.I.D  Allergies: No Known Allergies  SH: Lives in [place], married.  1 living son.  Prior smoker.   FH: 2 sons died of MIs in their 17s.  Another son committed suicide.     Vital Signs: BP 132/86  Pulse 64  Ht 6\' 2"  (1.88 m)  Wt 349 lb 12.8 oz (158.668 kg)  BMI 44.91 kg/m2  PHYSICAL EXAM: Well nourished, well developed, in no acute distress HEENT: normal Neck: no JVD At 90 Cardiac:  normal S1, S2; RRR; no murmur Lungs:  clear to auscultation bilaterally, no wheezing, rhonchi or rales Abd: soft, nontender, no hepatomegaly Ext: Trace bilateral edema Skin: warm and dry Neuro:  CNs 2-12 intact, no focal abnormalities noted  EKG:  Sinus rhythm, heart rate 60, left axis deviation, first degree AV block with a PR interval of 266 ms, poor R-wave progression, incomplete right bundle branch block, no significant change  ASSESSMENT AND PLAN:

## 2010-12-01 NOTE — Assessment & Plan Note (Signed)
Check lipids and LFTs in one month. 

## 2010-12-01 NOTE — Assessment & Plan Note (Signed)
Followup basic metabolic panel.  Consider restarting lisinopril after results are obtained.

## 2010-12-01 NOTE — Assessment & Plan Note (Signed)
No angina.  Continue aspirin Plavix and statin.  I have recommended that he try to increase his activity as much as possible at home.  We can revisit proceed with cardiac rehabilitation and followup visit.  Followup with Dr. Shirlee Latch in 2 months.

## 2010-12-01 NOTE — Patient Instructions (Addendum)
Your physician recommends that you schedule a follow-up appointment in: 2 months with Dr Shirlee Latch Your physician recommends that you return for lab work in: today and in 1 month

## 2011-01-01 ENCOUNTER — Ambulatory Visit (INDEPENDENT_AMBULATORY_CARE_PROVIDER_SITE_OTHER): Payer: Medicare Other | Admitting: *Deleted

## 2011-01-01 ENCOUNTER — Other Ambulatory Visit: Payer: Self-pay | Admitting: Cardiovascular Disease

## 2011-01-01 DIAGNOSIS — I2510 Atherosclerotic heart disease of native coronary artery without angina pectoris: Secondary | ICD-10-CM

## 2011-01-01 DIAGNOSIS — I509 Heart failure, unspecified: Secondary | ICD-10-CM

## 2011-01-01 DIAGNOSIS — I5023 Acute on chronic systolic (congestive) heart failure: Secondary | ICD-10-CM

## 2011-01-01 DIAGNOSIS — I5032 Chronic diastolic (congestive) heart failure: Secondary | ICD-10-CM

## 2011-01-01 LAB — HEPATIC FUNCTION PANEL
ALT: 33 U/L (ref 0–53)
AST: 32 U/L (ref 0–37)
Total Bilirubin: 0.4 mg/dL (ref 0.3–1.2)
Total Protein: 7.3 g/dL (ref 6.0–8.3)

## 2011-01-01 LAB — BASIC METABOLIC PANEL
CO2: 26 mEq/L (ref 19–32)
Chloride: 108 mEq/L (ref 96–112)
Creatinine, Ser: 1.1 mg/dL (ref 0.4–1.5)
Glucose, Bld: 92 mg/dL (ref 70–99)

## 2011-01-01 LAB — LIPID PANEL
Cholesterol: 196 mg/dL (ref 0–200)
Total CHOL/HDL Ratio: 4

## 2011-01-09 ENCOUNTER — Telehealth: Payer: Self-pay | Admitting: *Deleted

## 2011-01-09 DIAGNOSIS — I5032 Chronic diastolic (congestive) heart failure: Secondary | ICD-10-CM

## 2011-01-09 DIAGNOSIS — E785 Hyperlipidemia, unspecified: Secondary | ICD-10-CM

## 2011-01-09 MED ORDER — ROSUVASTATIN CALCIUM 40 MG PO TABS: 40.0000 mg | ORAL_TABLET | Freq: Every day | ORAL | Status: DC

## 2011-01-09 NOTE — Telephone Encounter (Signed)
Notes Recorded by Jacqlyn Krauss, RN on 01/09/2011 at 3:10 PM Pt has been taking lipitor. Pt will stop lipitor and start crestor 40mg  daily. He will return for fasting lipid/ liver profile 03/06/11. Notes Recorded by Jacqlyn Krauss, RN on 01/09/2011 at 3:09 PM I discussed Dr Alford Highland recommendations with pt's wife. She verbalized understanding.    Notes Recorded by Marca Ancona, MD on 01/07/2011 at 9:07 PM LDL is still too high. If he is taking Lipitor 80, he needs to change to Crestor 40 with lipids/LFTs in 2 months.

## 2011-01-13 ENCOUNTER — Other Ambulatory Visit: Payer: Self-pay | Admitting: Internal Medicine

## 2011-02-07 ENCOUNTER — Encounter: Payer: Self-pay | Admitting: Cardiology

## 2011-02-07 ENCOUNTER — Ambulatory Visit (INDEPENDENT_AMBULATORY_CARE_PROVIDER_SITE_OTHER): Payer: Medicare Other | Admitting: Cardiology

## 2011-02-07 DIAGNOSIS — I509 Heart failure, unspecified: Secondary | ICD-10-CM

## 2011-02-07 DIAGNOSIS — I2510 Atherosclerotic heart disease of native coronary artery without angina pectoris: Secondary | ICD-10-CM

## 2011-02-07 DIAGNOSIS — E785 Hyperlipidemia, unspecified: Secondary | ICD-10-CM

## 2011-02-07 DIAGNOSIS — I5032 Chronic diastolic (congestive) heart failure: Secondary | ICD-10-CM

## 2011-02-07 MED ORDER — LISINOPRIL 2.5 MG PO TABS: 2.5000 mg | ORAL_TABLET | Freq: Every day | ORAL | Status: DC

## 2011-02-07 NOTE — Assessment & Plan Note (Signed)
Stable s/p PCI to CFX.  Also has chronic total occlusion of LAD with collaterals from the RCA.  Patient was seen by CVTS and deemed a poor CABG candidate due to obesity and poor functional status.  He has had no chest pain since discharge.  - Continue ASA 81, Plavix, Imdur, Toprol XL, and statin.  - I will start him on a low dose of lisinopril, 2.5 mg daily, with BMET in 2 wks.

## 2011-02-07 NOTE — Patient Instructions (Signed)
Start lisinopril 2.5mg  daily.  Your physician recommends that you return for lab work in: 2 weeks--BMET  414.01  Dr Shirlee Latch has recommended participation in Cardiac Rehab at [facility].   Your physician recommends that you return for a FASTING lipid profile in January 2013. This is already scheduled.  Your physician recommends that you schedule a follow-up appointment in: 3 months with Dr Shirlee Latch.

## 2011-02-07 NOTE — Assessment & Plan Note (Signed)
Patient has exertional dyspnea with a component of diastolic CHF.  However, I think that the main factor is obesity and deconditioning.  He is not very active at all.  He does not appear volume overloaded today.  Continue current dose of Lasix.  I talked to him at length about increasing his exercise level.  He is going to try cardiac rehab.

## 2011-02-07 NOTE — Assessment & Plan Note (Signed)
Statin recently adjusted.  Goal LDL < 70.  Repeat lipids in 1/13.

## 2011-02-07 NOTE — Assessment & Plan Note (Signed)
Weight loss is imperative.  We discussed diet and increased exercise.

## 2011-02-07 NOTE — Progress Notes (Signed)
PCP: Dr. Jeannetta Nap  77 yo with history of morbid obesity, diastolic CHF, and CAD presents for cardiology followup.  Patient presented to [facility] with chest pain in 8/12 and was found to have NSTEMI.  Left heart cath showed a total occlusion of the proximal LAD with collaterals from the RCA.  There were serial 90% stenoses in the circumflex.  He was evaluated for CABG but was turned down due to obesity and poor functional status.  He had a drug-eluting stent placed in the CFX and the LAD was not intervened upon (chronic total occlusion).    He has had no chest pain.  He is short of breath after walking 75-80 feet on flat ground, which seems to be chronic.  He walks around his house but does not get out and is not very active in general.  He is limited by both low back pain and residual left-sided weakness from prior stroke.    Labs (8/12): K 3.8, creatinine 1.05 Labs (10/12): BNP 64 Labs (11/12): K 3.4, creatinine 1.1, HDL 46, TGs 217, LDL 129  PMH: 1. CVA 2. HTN 3. Right lower leg DVT in 2003 4. Obesity 5. CAD: NSTEMI (8/12).  LHC with total occlusion LAD and collaterals from RCA, 90% serial CFX stenoses.  Patient was evaluated for CABG but was turned down by CVTS given obesity and poor functional status.  He had DES to CFX placed instead.   6. Diastolic CHF: Echo (8/12) with EF 45-50%, anteroseptal hypokinesis, mild LV hypertrophy, normal RV, aortic sclerosis.  7. Hyperlipidemia 8. Gout 9. Seizure disorder 10. Bladder calculi 11. BPH  SH: Lives in [place], married.  1 living son.  Prior smoker.   FH: 2 sons died of MIs in their 1s.  Another son committed suicide.    ROS: All systems reviewed and negative except as per HPI.   Current Outpatient Prescriptions  Medication Sig Dispense Refill  . Acetaminophen (TYLENOL 8 HOUR PO) Take 2 tablets by mouth as needed.       Marland Kitchen allopurinol (ZYLOPRIM) 300 MG tablet Take 300 mg by mouth daily.        Marland Kitchen aspirin 81 MG tablet Take 81 mg by mouth  daily.        . bisacodyl (DULCOLAX) 5 MG EC tablet Take 5 mg by mouth daily as needed.        . clopidogrel (PLAVIX) 75 MG tablet Take 75 mg by mouth daily.        . furosemide (LASIX) 40 MG tablet        . isosorbide mononitrate (IMDUR) 30 MG 24 hr tablet Take 30 mg by mouth daily.        . metoprolol (TOPROL-XL) 50 MG 24 hr tablet Take 50 mg by mouth daily.        . Multiple Vitamins-Minerals (EYE VITAMINS) CAPS Take 1 capsule by mouth daily.        . niacin (NIASPAN) 500 MG CR tablet Take 500 mg by mouth at bedtime.        . nitroGLYCERIN (NITROSTAT) 0.4 MG SL tablet Place 0.4 mg under the tongue every 5 (five) minutes as needed.        . phenytoin (DILANTIN) 100 MG ER capsule Take 100 mg by mouth. Taking 3 Tablet in A.M and 2 in P.M       . potassium chloride SA (K-DUR,KLOR-CON) 20 MEQ tablet Take 1 tablet (20 mEq total) by mouth daily.      . rosuvastatin (  CRESTOR) 40 MG tablet Take 1 tablet (40 mg total) by mouth daily.  30 tablet  3  . DISCONTD: furosemide (LASIX) 40 MG tablet TAKE 1 TABLET BY MOUTH EVERY MORNING AND1/2 IN THE EVENING  60 tablet  11  . lisinopril (ZESTRIL) 2.5 MG tablet Take 1 tablet (2.5 mg total) by mouth daily.  30 tablet  6    BP 112/70  Pulse 61  Ht 6' (1.829 m)  Wt 147.419 kg (325 lb)  BMI 44.08 kg/m2  SpO2 96% General: Obese, NAD Neck: No JVD, no thyromegaly or thyroid nodule.  Lungs: Clear to auscultation bilaterally with normal respiratory effort. CV: Nondisplaced PMI.  Heart regular S1/S2, no S3/S4, no murmur.  Trace ankle edema.  No carotid bruit.  Normal pedal pulses.  Abdomen: Soft, nontender, no hepatosplenomegaly, no distention.   Neurologic: Alert and oriented x 3.  Psych: Normal affect. Extremities: No clubbing or cyanosis.

## 2011-02-23 ENCOUNTER — Other Ambulatory Visit: Payer: Self-pay | Admitting: Cardiology

## 2011-02-23 ENCOUNTER — Other Ambulatory Visit (INDEPENDENT_AMBULATORY_CARE_PROVIDER_SITE_OTHER): Payer: Medicare Other | Admitting: *Deleted

## 2011-02-23 DIAGNOSIS — I509 Heart failure, unspecified: Secondary | ICD-10-CM

## 2011-02-23 DIAGNOSIS — I2510 Atherosclerotic heart disease of native coronary artery without angina pectoris: Secondary | ICD-10-CM

## 2011-02-23 DIAGNOSIS — I5032 Chronic diastolic (congestive) heart failure: Secondary | ICD-10-CM

## 2011-02-23 DIAGNOSIS — E785 Hyperlipidemia, unspecified: Secondary | ICD-10-CM

## 2011-02-23 LAB — BASIC METABOLIC PANEL
CO2: 25 mEq/L (ref 19–32)
Chloride: 109 mEq/L (ref 96–112)
Creatinine, Ser: 1 mg/dL (ref 0.4–1.5)
Potassium: 4.1 mEq/L (ref 3.5–5.1)

## 2011-02-23 LAB — HEPATIC FUNCTION PANEL
ALT: 11 U/L (ref 0–53)
AST: 15 U/L (ref 0–37)
Alkaline Phosphatase: 81 U/L (ref 39–117)
Bilirubin, Direct: 0 mg/dL (ref 0.0–0.3)
Total Bilirubin: 0.2 mg/dL — ABNORMAL LOW (ref 0.3–1.2)
Total Protein: 6.8 g/dL (ref 6.0–8.3)

## 2011-02-23 LAB — LDL CHOLESTEROL, DIRECT: Direct LDL: 115.5 mg/dL

## 2011-02-23 LAB — LIPID PANEL: Total CHOL/HDL Ratio: 4

## 2011-02-28 ENCOUNTER — Telehealth: Payer: Self-pay

## 2011-02-28 NOTE — Telephone Encounter (Signed)
Message copied by Charna Elizabeth on Wed Feb 28, 2011 11:21 AM ------      Message from: Laurey Morale      Created: Sun Feb 25, 2011 11:38 PM       LDL is too high.  Goal LDL < 70.  Is he taking Crestor 40 mg every day?

## 2011-02-28 NOTE — Telephone Encounter (Signed)
If he is taking it every day, would add Niaspan 500 mg qhs.

## 2011-02-28 NOTE — Telephone Encounter (Signed)
Wife states patient is taking crestor 40 mg at night.

## 2011-03-02 MED ORDER — NIACIN ER (ANTIHYPERLIPIDEMIC) 1000 MG PO TBCR: 1000.0000 mg | EXTENDED_RELEASE_TABLET | Freq: Every day | ORAL | Status: DC

## 2011-03-02 NOTE — Telephone Encounter (Signed)
Spoke with patient's wife.Advised to increase Niaspan to 1000 mg every night.Repeat fasting lipids/liver in 2 months.

## 2011-03-02 NOTE — Telephone Encounter (Signed)
Spoke with patient's wife,patient already taking niaspan 500 mg every night.

## 2011-03-02 NOTE — Telephone Encounter (Signed)
Increase Niaspan to 1000 mg daily with lipids/LFTs in 2 months.

## 2011-03-05 NOTE — Progress Notes (Signed)
See phone note 12/13 °

## 2011-03-06 ENCOUNTER — Ambulatory Visit (INDEPENDENT_AMBULATORY_CARE_PROVIDER_SITE_OTHER): Payer: Medicare Other | Admitting: *Deleted

## 2011-03-06 DIAGNOSIS — I509 Heart failure, unspecified: Secondary | ICD-10-CM

## 2011-03-06 DIAGNOSIS — I5032 Chronic diastolic (congestive) heart failure: Secondary | ICD-10-CM

## 2011-03-06 DIAGNOSIS — E785 Hyperlipidemia, unspecified: Secondary | ICD-10-CM

## 2011-03-06 LAB — HEPATIC FUNCTION PANEL
AST: 15 U/L (ref 0–37)
Albumin: 3.3 g/dL — ABNORMAL LOW (ref 3.5–5.2)
Total Bilirubin: 0.4 mg/dL (ref 0.3–1.2)

## 2011-03-06 LAB — LIPID PANEL
Cholesterol: 205 mg/dL — ABNORMAL HIGH (ref 0–200)
HDL: 48.3 mg/dL (ref >39.00)
Triglycerides: 147 mg/dL (ref 0.0–149.0)
VLDL: 29.4 mg/dL (ref 0.0–40.0)

## 2011-03-06 LAB — LDL CHOLESTEROL, DIRECT: Direct LDL: 132.2 mg/dL

## 2011-03-07 ENCOUNTER — Telehealth: Payer: Self-pay | Admitting: *Deleted

## 2011-03-07 NOTE — Telephone Encounter (Signed)
03/07/11--dr mclean--spoke with mrs Debruhl who states mr Winch has been taking his crestor regularily so i called in zetia 10mg  1 tab qd--i only called in 1 month's worth, to see if he tolerates it and will need 3 month rx if tolerates med--nt

## 2011-04-07 ENCOUNTER — Other Ambulatory Visit (HOSPITAL_COMMUNITY): Payer: Self-pay | Admitting: Internal Medicine

## 2011-04-09 NOTE — Telephone Encounter (Signed)
Refilled metoprolol 

## 2011-04-16 ENCOUNTER — Other Ambulatory Visit (HOSPITAL_COMMUNITY): Payer: Self-pay | Admitting: Internal Medicine

## 2011-05-07 ENCOUNTER — Ambulatory Visit (INDEPENDENT_AMBULATORY_CARE_PROVIDER_SITE_OTHER): Payer: Medicare Other | Admitting: Cardiology

## 2011-05-07 ENCOUNTER — Encounter: Payer: Self-pay | Admitting: Cardiology

## 2011-05-07 DIAGNOSIS — I2510 Atherosclerotic heart disease of native coronary artery without angina pectoris: Secondary | ICD-10-CM

## 2011-05-07 DIAGNOSIS — R0989 Other specified symptoms and signs involving the circulatory and respiratory systems: Secondary | ICD-10-CM

## 2011-05-07 DIAGNOSIS — E785 Hyperlipidemia, unspecified: Secondary | ICD-10-CM

## 2011-05-07 DIAGNOSIS — I5032 Chronic diastolic (congestive) heart failure: Secondary | ICD-10-CM

## 2011-05-07 DIAGNOSIS — I509 Heart failure, unspecified: Secondary | ICD-10-CM

## 2011-05-07 MED ORDER — CLOPIDOGREL BISULFATE 75 MG PO TABS: 75.0000 mg | ORAL_TABLET | Freq: Every day | ORAL | Status: DC

## 2011-05-07 MED ORDER — ISOSORBIDE MONONITRATE ER 30 MG PO TB24: 30.0000 mg | ORAL_TABLET | Freq: Every day | ORAL | Status: DC

## 2011-05-07 MED ORDER — FUROSEMIDE 40 MG PO TABS: 40.0000 mg | ORAL_TABLET | Freq: Every day | ORAL | Status: DC

## 2011-05-07 MED ORDER — ROSUVASTATIN CALCIUM 40 MG PO TABS: 40.0000 mg | ORAL_TABLET | Freq: Every day | ORAL | Status: DC

## 2011-05-07 MED ORDER — POTASSIUM CHLORIDE CRYS ER 20 MEQ PO TBCR: 20.0000 meq | EXTENDED_RELEASE_TABLET | Freq: Two times a day (BID) | ORAL | Status: DC

## 2011-05-07 MED ORDER — METOPROLOL SUCCINATE ER 50 MG PO TB24: 50.0000 mg | ORAL_TABLET | Freq: Every day | ORAL | Status: DC

## 2011-05-07 MED ORDER — LISINOPRIL 5 MG PO TABS: 5.0000 mg | ORAL_TABLET | Freq: Every day | ORAL | Status: DC

## 2011-05-07 MED ORDER — EZETIMIBE 10 MG PO TABS: 10.0000 mg | ORAL_TABLET | Freq: Every day | ORAL | Status: DC

## 2011-05-07 MED ORDER — NIACIN ER (ANTIHYPERLIPIDEMIC) 1000 MG PO TBCR: 1000.0000 mg | EXTENDED_RELEASE_TABLET | Freq: Every day | ORAL | Status: DC

## 2011-05-07 NOTE — Assessment & Plan Note (Signed)
Weight loss is imperative.  We discussed diet and increased exercise.  I am going to re-refer him to cardiac rehab.

## 2011-05-07 NOTE — Assessment & Plan Note (Signed)
Check lipids/LFTs with goal LDL < 70.  

## 2011-05-07 NOTE — Assessment & Plan Note (Signed)
Patient has exertional dyspnea with a component of diastolic CHF.  However, I think that the main factor is obesity and deconditioning.  His functional status is poor.  He is not very active at all.  NYHA class IIIb symptoms.  Breathing seems to have gotten somewhat worse.  EF 45-50% by last echo.  - Will repeat echo to reassess LV systolic function. - Increase Lasix to 40 mg daily - Increase lisinopril to 5 mg daily.  - BMET/BNP in 2 wks.

## 2011-05-07 NOTE — Patient Instructions (Signed)
Take lasix(furosemide) 40mg  daily in the morning.  Increase lisinopril to 5mg  daily.  Your physician recommends that you return for a FASTING lipid profile /liver profile/BMET/BNP in 2 weeks--  414.01 786.09  Your physician has requested that you have an echocardiogram. Echocardiography is a painless test that uses sound waves to create images of your heart. It provides your doctor with information about the size and shape of your heart and how well your heart's chambers and valves are working. This procedure takes approximately one hour. There are no restrictions for this procedure. IN 2 weeks when you have fasting lab  Dr Shirlee Latch has recommended participation in Cardiac Rehab at [facility]. We will refer you.  Your physician recommends that you schedule a follow-up appointment in: 2 months with Dr Shirlee Latch.

## 2011-05-07 NOTE — Assessment & Plan Note (Signed)
S/p PCI to CFX.  Also has chronic total occlusion of LAD with collaterals from the RCA.  Patient was seen by CVTS and deemed a poor CABG candidate due to obesity and poor functional status.  He has had only occasional atypical chest pain since discharge. - Continue ASA 81, Plavix, Imdur, Toprol XL, ACEI, and statin.

## 2011-05-07 NOTE — Progress Notes (Signed)
PCP: Dr. Jeannetta Nap  77 yo with history of morbid obesity, diastolic CHF, and CAD presents for cardiology followup.  Patient presented to [facility] with chest pain in 8/12 and was found to have NSTEMI.  Left heart cath showed a total occlusion of the proximal LAD with collaterals from the RCA.  There were serial 90% stenoses in the circumflex.  He was evaluated for CABG but was turned down due to obesity and poor functional status.  He had a drug-eluting stent placed in the CFX and the LAD was not intervened upon (chronic total occlusion).    Since last appointment, he has gained weight.  He is not very active.  He never enrolled in cardiac rehab.  He feels "weak" in general.  He only walks around the house and is short of breath walking around the house.  He has 3-pillow orthopnea (chronic).  He has occasional "indigestion-like" atypical chest pain not related to exertion.  No change in this pattern since his PCI.  No exertional chest pain.  LDL was high in 1/13 and Zetia was added.  His diet is very poor.  Eats 2 eggs every morning.   Labs (8/12): K 3.8, creatinine 1.05 Labs (10/12): BNP 64 Labs (11/12): K 3.4, creatinine 1.1, HDL 46, TGs 217, LDL 129 Labs (12/12): K 4.1, creatinine 1.0 Labs (1/13): LDL 132, HDL 48  PMH: 1. CVA 2. HTN 3. Right lower leg DVT in 2003 4. Obesity 5. CAD: NSTEMI (8/12).  LHC with total occlusion LAD and collaterals from RCA, 90% serial CFX stenoses.  Patient was evaluated for CABG but was turned down by CVTS given obesity and poor functional status.  He had DES to CFX placed instead.   6. Diastolic CHF: Echo (8/12) with EF 45-50%, anteroseptal hypokinesis, mild LV hypertrophy, normal RV, aortic sclerosis.  7. Hyperlipidemia 8. Gout 9. Seizure disorder 10. Bladder calculi 11. BPH  SH: Lives in [place], married.  1 living son.  Prior smoker.   FH: 2 sons died of MIs in their 64s.  Another son committed suicide.    ROS: All systems reviewed and negative except as  per HPI.   Current Outpatient Prescriptions  Medication Sig Dispense Refill  . Acetaminophen (TYLENOL 8 HOUR PO) Take 2 tablets by mouth as needed.       Marland Kitchen allopurinol (ZYLOPRIM) 300 MG tablet Take 300 mg by mouth daily.        Marland Kitchen aspirin 81 MG tablet Take 81 mg by mouth daily.        . bisacodyl (DULCOLAX) 5 MG EC tablet Take 5 mg by mouth daily as needed.        . clopidogrel (PLAVIX) 75 MG tablet Take 1 tablet (75 mg total) by mouth daily.  90 tablet  3  . ezetimibe (ZETIA) 10 MG tablet Take 1 tablet (10 mg total) by mouth daily.  90 tablet  3  . isosorbide mononitrate (IMDUR) 30 MG 24 hr tablet Take 1 tablet (30 mg total) by mouth daily.  90 tablet  3  . metoprolol succinate (TOPROL-XL) 50 MG 24 hr tablet Take 1 tablet (50 mg total) by mouth daily. Take with or immediately following a meal.  90 tablet  3  . Multiple Vitamins-Minerals (EYE VITAMINS) CAPS Take 1 capsule by mouth daily.        . niacin (NIASPAN) 1000 MG CR tablet Take 1 tablet (1,000 mg total) by mouth at bedtime.  90 tablet  3  . nitroGLYCERIN (NITROSTAT) 0.4  MG SL tablet Place 0.4 mg under the tongue every 5 (five) minutes as needed.        . phenytoin (DILANTIN) 100 MG ER capsule Take 100 mg by mouth. Taking 3 Tablet in A.M and 2 in P.M       . potassium chloride SA (K-DUR,KLOR-CON) 20 MEQ tablet Take 1 tablet (20 mEq total) by mouth 2 (two) times daily.  180 tablet  3  . rosuvastatin (CRESTOR) 40 MG tablet Take 1 tablet (40 mg total) by mouth daily.  90 tablet  3  . vardenafil (LEVITRA) 20 MG tablet Take 20 mg by mouth daily as needed.      Marland Kitchen DISCONTD: ezetimibe (ZETIA) 10 MG tablet Take 10 mg by mouth daily.      Marland Kitchen DISCONTD: furosemide (LASIX) 40 MG tablet        . DISCONTD: isosorbide mononitrate (IMDUR) 30 MG 24 hr tablet TAKE 1 TABLET BY MOUTH ONCE DAILY  30 tablet  6  . DISCONTD: potassium chloride SA (K-DUR,KLOR-CON) 20 MEQ tablet Take 1 tablet (20 mEq total) by mouth daily.      Marland Kitchen DISCONTD: rosuvastatin (CRESTOR) 40  MG tablet Take 1 tablet (40 mg total) by mouth daily.  30 tablet  3  . furosemide (LASIX) 40 MG tablet Take 1 tablet (40 mg total) by mouth daily.  90 tablet  3  . lisinopril (PRINIVIL,ZESTRIL) 5 MG tablet Take 1 tablet (5 mg total) by mouth daily.  90 tablet  3    BP 107/66  Pulse 58  Ht 6' (1.829 m)  Wt 357 lb (161.934 kg)  BMI 48.42 kg/m2 General: Obese, NAD Neck: Thick neck, JVP difficult, no thyromegaly or thyroid nodule.  Lungs: Clear to auscultation bilaterally with normal respiratory effort. CV: Nondisplaced PMI.  Heart regular S1/S2, no S3/S4, no murmur.  1+ ankle edema.  No carotid bruit.  Normal pedal pulses.  Abdomen: Soft, nontender, no hepatosplenomegaly, no distention.   Neurologic: Alert and oriented x 3.  Psych: Normal affect. Extremities: No clubbing or cyanosis.

## 2011-05-22 ENCOUNTER — Ambulatory Visit (INDEPENDENT_AMBULATORY_CARE_PROVIDER_SITE_OTHER): Payer: Medicare Other | Admitting: *Deleted

## 2011-05-22 ENCOUNTER — Other Ambulatory Visit: Payer: Self-pay

## 2011-05-22 ENCOUNTER — Ambulatory Visit (HOSPITAL_COMMUNITY): Payer: Medicare Other | Attending: Cardiology

## 2011-05-22 DIAGNOSIS — R0602 Shortness of breath: Secondary | ICD-10-CM

## 2011-05-22 DIAGNOSIS — E785 Hyperlipidemia, unspecified: Secondary | ICD-10-CM

## 2011-05-22 DIAGNOSIS — I10 Essential (primary) hypertension: Secondary | ICD-10-CM

## 2011-05-22 DIAGNOSIS — R0989 Other specified symptoms and signs involving the circulatory and respiratory systems: Secondary | ICD-10-CM

## 2011-05-22 DIAGNOSIS — R0609 Other forms of dyspnea: Secondary | ICD-10-CM | POA: Insufficient documentation

## 2011-05-22 DIAGNOSIS — I2510 Atherosclerotic heart disease of native coronary artery without angina pectoris: Secondary | ICD-10-CM | POA: Insufficient documentation

## 2011-05-22 DIAGNOSIS — Z8673 Personal history of transient ischemic attack (TIA), and cerebral infarction without residual deficits: Secondary | ICD-10-CM | POA: Insufficient documentation

## 2011-05-22 DIAGNOSIS — I509 Heart failure, unspecified: Secondary | ICD-10-CM | POA: Insufficient documentation

## 2011-05-22 LAB — LIPID PANEL
HDL: 53.9 mg/dL (ref >39.00)
Total CHOL/HDL Ratio: 3
VLDL: 24.8 mg/dL (ref 0.0–40.0)

## 2011-05-22 LAB — BASIC METABOLIC PANEL
CO2: 25 mEq/L (ref 19–32)
Calcium: 8.8 mg/dL (ref 8.4–10.5)
GFR: 69.38 mL/min (ref >60.00)
Glucose, Bld: 94 mg/dL (ref 70–99)
Potassium: 4.4 mEq/L (ref 3.5–5.1)
Sodium: 139 mEq/L (ref 135–145)

## 2011-05-22 LAB — HEPATIC FUNCTION PANEL
AST: 17 U/L (ref 0–37)
Albumin: 3.4 g/dL — ABNORMAL LOW (ref 3.5–5.2)
Alkaline Phosphatase: 73 U/L (ref 39–117)
Bilirubin, Direct: 0.1 mg/dL (ref 0.0–0.3)

## 2011-05-31 ENCOUNTER — Ambulatory Visit (HOSPITAL_COMMUNITY): Payer: Medicare Other

## 2011-06-06 ENCOUNTER — Ambulatory Visit (HOSPITAL_COMMUNITY): Payer: Medicare Other

## 2011-06-07 ENCOUNTER — Encounter (HOSPITAL_COMMUNITY)
Admission: RE | Admit: 2011-06-07 | Discharge: 2011-06-07 | Disposition: A | Discharge status: Home / Self Care | Payer: Medicare Other | Source: Ambulatory Visit | Attending: Cardiology | Admitting: Cardiology

## 2011-06-08 ENCOUNTER — Ambulatory Visit (HOSPITAL_COMMUNITY): Payer: Medicare Other

## 2011-06-11 ENCOUNTER — Encounter (HOSPITAL_COMMUNITY): Payer: Medicare Other

## 2011-06-13 ENCOUNTER — Ambulatory Visit (HOSPITAL_COMMUNITY): Payer: Medicare Other

## 2011-06-13 ENCOUNTER — Encounter (HOSPITAL_COMMUNITY): Payer: Medicare Other

## 2011-06-14 NOTE — Progress Notes (Signed)
Benjamin Kaiser reported to exercise today.  Benjamin  Kaiser said he was very tired and wore out after walking from the west entrance to the cardiac rehab waiting area. Benjamin Kaiser walked to the scale to be weighed and was unable to do any further activity due to his weight and his general inactivity.  Monitor placed on patient showed sinus rhythm with a first degree heart block.  Blood pressure 122/70. Benjamin Kaiser said he will not be able to do the physical requirements of the exercise program at this time.  Patient and his wife given a brochure on the [facility] program.  Will fax [facility]  Referral to Dr Alford Highland office. Harriett Sine at [facility] office called and notified of today's events. Patient taken to his car via wheelchair with assistance times two.

## 2011-06-15 ENCOUNTER — Encounter (HOSPITAL_COMMUNITY): Payer: Medicare Other

## 2011-06-15 ENCOUNTER — Ambulatory Visit (HOSPITAL_COMMUNITY): Payer: Medicare Other

## 2011-06-18 ENCOUNTER — Encounter (HOSPITAL_COMMUNITY): Payer: Medicare Other

## 2011-06-20 ENCOUNTER — Ambulatory Visit (HOSPITAL_COMMUNITY): Payer: Medicare Other

## 2011-06-20 ENCOUNTER — Telehealth: Payer: Self-pay | Admitting: *Deleted

## 2011-06-20 ENCOUNTER — Encounter (HOSPITAL_COMMUNITY): Payer: Medicare Other

## 2011-06-20 NOTE — Telephone Encounter (Signed)
Subject: FW: [facility] Referral   Keeleigh Terris, could you send Mr Ransford to the [facility] program?  ----- Message ----- From: Thayer Headings, RN Sent: 06/14/2011 11:44 AM To: Laurey Morale, MD, Jacqlyn Krauss, RN Subject: [facility] Referral   Dr Shirlee Latch,  Mr Voong will not be able to participate in cardiac rehab at this time due to his deconditioned status and his immobility. Please consider referring Mr Stockman to [facility]. We would like to work with him after his evaluation is complete. We had to wheel Mr Perrow back to the car after he walked in. Mr Ohanesian does very little at home besides sitting in his recliner. I hope that [facility] will help.  Thanks a lot!  Byrd Hesselbach  06/20/11--spoke with pt's wife about referral for pt to the [facility] program. Pt's wife states  pt does not want a referral to [facility] program at the present time.

## 2011-06-22 ENCOUNTER — Encounter (HOSPITAL_COMMUNITY): Payer: Medicare Other

## 2011-06-22 ENCOUNTER — Ambulatory Visit (HOSPITAL_COMMUNITY): Payer: Medicare Other

## 2011-06-25 ENCOUNTER — Encounter (HOSPITAL_COMMUNITY): Payer: Medicare Other

## 2011-06-27 ENCOUNTER — Encounter (HOSPITAL_COMMUNITY): Payer: Medicare Other

## 2011-06-27 ENCOUNTER — Ambulatory Visit (HOSPITAL_COMMUNITY): Payer: Medicare Other

## 2011-06-29 ENCOUNTER — Ambulatory Visit (HOSPITAL_COMMUNITY): Payer: Medicare Other

## 2011-06-29 ENCOUNTER — Encounter (HOSPITAL_COMMUNITY): Payer: Medicare Other

## 2011-07-02 ENCOUNTER — Encounter (HOSPITAL_COMMUNITY): Payer: Medicare Other

## 2011-07-04 ENCOUNTER — Encounter (HOSPITAL_COMMUNITY): Payer: Medicare Other

## 2011-07-04 ENCOUNTER — Ambulatory Visit (HOSPITAL_COMMUNITY): Payer: Medicare Other

## 2011-07-06 ENCOUNTER — Ambulatory Visit (HOSPITAL_COMMUNITY): Payer: Medicare Other

## 2011-07-06 ENCOUNTER — Encounter (HOSPITAL_COMMUNITY): Payer: Medicare Other

## 2011-07-09 ENCOUNTER — Encounter (HOSPITAL_COMMUNITY): Payer: Medicare Other

## 2011-07-11 ENCOUNTER — Encounter (HOSPITAL_COMMUNITY): Payer: Medicare Other

## 2011-07-11 ENCOUNTER — Ambulatory Visit (HOSPITAL_COMMUNITY): Payer: Medicare Other

## 2011-07-13 ENCOUNTER — Encounter (HOSPITAL_COMMUNITY): Payer: Medicare Other

## 2011-07-13 ENCOUNTER — Ambulatory Visit (HOSPITAL_COMMUNITY): Payer: Medicare Other

## 2011-07-16 ENCOUNTER — Encounter: Payer: Self-pay | Admitting: Cardiology

## 2011-07-16 ENCOUNTER — Encounter (HOSPITAL_COMMUNITY): Payer: Medicare Other

## 2011-07-16 ENCOUNTER — Ambulatory Visit (INDEPENDENT_AMBULATORY_CARE_PROVIDER_SITE_OTHER): Payer: Medicare Other | Admitting: Cardiology

## 2011-07-16 VITALS — BP 129/81 | HR 63 | Ht 73.0 in | Wt 350.0 lb

## 2011-07-16 DIAGNOSIS — I509 Heart failure, unspecified: Secondary | ICD-10-CM

## 2011-07-16 DIAGNOSIS — I5032 Chronic diastolic (congestive) heart failure: Secondary | ICD-10-CM

## 2011-07-16 DIAGNOSIS — I2510 Atherosclerotic heart disease of native coronary artery without angina pectoris: Secondary | ICD-10-CM

## 2011-07-16 DIAGNOSIS — E785 Hyperlipidemia, unspecified: Secondary | ICD-10-CM

## 2011-07-16 MED ORDER — ATORVASTATIN CALCIUM 80 MG PO TABS: 80.0000 mg | ORAL_TABLET | Freq: Every day | ORAL | Status: DC

## 2011-07-16 NOTE — Assessment & Plan Note (Signed)
S/p PCI to CFX.  Also has chronic total occlusion of LAD with collaterals from the RCA.  Patient was seen by CVTS and deemed a poor CABG candidate due to obesity and poor functional status.  He has had occasional atypical chest pain since discharge, nothing exertional.  Chest pain that he has tends to occur when lying down at night and may be GERD.  - Continue ASA 81, Plavix, Imdur, Toprol XL, ACEI, and statin.

## 2011-07-16 NOTE — Patient Instructions (Signed)
Your physician recommends that you schedule a follow-up appointment in: 4 months with Dr. Shirlee Latch.  Your physician recommends that you return for fasting lab work in: 2 months.  Lipids and Liver  STOP Crestor.  START Atorvastatin 80mg  every evening.  Refer to Home Health for Home Physical Therapy.

## 2011-07-16 NOTE — Assessment & Plan Note (Signed)
Weight loss is imperative.  He is not very active.  I am going to see if I can get him PT at home.

## 2011-07-16 NOTE — Progress Notes (Signed)
PCP: Dr. Jeannetta Nap  77 yo with history of morbid obesity, diastolic CHF, and CAD presents for cardiology followup.  Patient presented to [facility] with chest pain in 8/12 and was found to have NSTEMI.  Left heart cath showed a total occlusion of the proximal LAD with collaterals from the RCA.  There were serial 90% stenoses in the circumflex.  He was evaluated for CABG but was turned down due to obesity and poor functional status.  He had a drug-eluting stent placed in the CFX and the LAD was not intervened upon (chronic total occlusion).    He is not very active and mobility is poor.  He can now walk in the house without significant dyspnea but is short of breath walking for longer distances.  He actually walked into the office today without a wheelchair.  He has also lost 7 lbs since last appointment.  He continues to have episodes of atypical chest pain.  He will feel chest and shoulder pain at rest about once a week.  When this occurs, his shoulders will be sore to touch, so may be musculoskeletal.  He has occasional reflux-type pain when lying down.  Last night, he noted "indigestion" while lying in bed that resolved with Tums.   Labs (8/12): K 3.8, creatinine 1.05 Labs (10/12): BNP 64 Labs (11/12): K 3.4, creatinine 1.1, HDL 46, TGs 217, LDL 129 Labs (12/12): K 4.1, creatinine 1.0 Labs (1/13): LDL 132, HDL 48 Labs (3/13): LDL 57, HDL 54, K 4.4, creatinine 1.1, proBNP 36  PMH: 1. CVA 2. HTN 3. Right lower leg DVT in 2003 4. Obesity 5. CAD: NSTEMI (8/12).  LHC with total occlusion LAD and collaterals from RCA, 90% serial CFX stenoses.  Patient was evaluated for CABG but was turned down by CVTS given obesity and poor functional status.  He had DES to CFX placed instead.   6. Diastolic CHF: Echo (8/12) with EF 45-50%, anteroseptal hypokinesis, mild LV hypertrophy, normal RV, aortic sclerosis.  Echo (3/13) with EF 50-55%, mild AS.  7. Hyperlipidemia 8. Gout 9. Seizure disorder 10. Bladder  calculi 11. BPH 12. Aortic stenosis: Mild on 3/13 echo.  13. GERD  SH: Lives in [place], married.  1 living son.  Prior smoker.   FH: 2 sons died of MIs in their 16s.  Another son committed suicide.    ROS: All systems reviewed and negative except as per HPI.   Current Outpatient Prescriptions  Medication Sig Dispense Refill  . allopurinol (ZYLOPRIM) 300 MG tablet Take 300 mg by mouth daily.        Marland Kitchen aspirin 81 MG tablet Take 81 mg by mouth daily.        . clopidogrel (PLAVIX) 75 MG tablet Take 1 tablet (75 mg total) by mouth daily.  90 tablet  3  . ezetimibe (ZETIA) 10 MG tablet Take 1 tablet (10 mg total) by mouth daily.  90 tablet  3  . furosemide (LASIX) 40 MG tablet Take 1 tablet (40 mg total) by mouth daily.  90 tablet  3  . ibuprofen (ADVIL,MOTRIN) 200 MG tablet Take 200 mg by mouth every 6 (six) hours as needed.      . isosorbide mononitrate (IMDUR) 30 MG 24 hr tablet Take 1 tablet (30 mg total) by mouth daily.  90 tablet  3  . lisinopril (PRINIVIL,ZESTRIL) 5 MG tablet Take 1 tablet (5 mg total) by mouth daily.  90 tablet  3  . metoprolol succinate (TOPROL-XL) 50 MG 24 hr  tablet Take 1 tablet (50 mg total) by mouth daily. Take with or immediately following a meal.  90 tablet  3  . Multiple Vitamins-Minerals (EYE VITAMINS) CAPS Take 1 capsule by mouth daily.        . niacin (NIASPAN) 1000 MG CR tablet Take 1 tablet (1,000 mg total) by mouth at bedtime.  90 tablet  3  . nitroGLYCERIN (NITROSTAT) 0.4 MG SL tablet Place 0.4 mg under the tongue every 5 (five) minutes as needed.        . phenytoin (DILANTIN) 100 MG ER capsule Take 100 mg by mouth. Taking 2 Tablets in A.M and 2 in P.M      . potassium chloride SA (K-DUR,KLOR-CON) 20 MEQ tablet Take 1 tablet (20 mEq total) by mouth 2 (two) times daily.  180 tablet  3  . atorvastatin (LIPITOR) 80 MG tablet Take 1 tablet (80 mg total) by mouth daily.  90 tablet  3  . bisacodyl (DULCOLAX) 5 MG EC tablet Take 5 mg by mouth daily as needed.           BP 129/81  Pulse 63  Ht 6\' 1"  (1.854 m)  Wt 350 lb (158.759 kg)  BMI 46.18 kg/m2 General: Obese, NAD Neck: Thick neck, JVP difficult, no thyromegaly or thyroid nodule.  Lungs: Clear to auscultation bilaterally with normal respiratory effort. CV: Nondisplaced PMI.  Heart regular S1/S2, no S3/S4, 1/6 SEM RUSB.  Trace ankle edema.  No carotid bruit.  Normal pedal pulses.  Abdomen: Soft, nontender, no hepatosplenomegaly, no distention.   Neurologic: Alert and oriented x 3.  Psych: Normal affect. Extremities: No clubbing or cyanosis.

## 2011-07-16 NOTE — Assessment & Plan Note (Signed)
Excellent lipids when last checked on Crestor, Niaspan, and Zetia.  Crestor went up in price for him recently.  I will stop Crestor and have him take atorvastatin 80 which should be cheaper.  Lipids/LFTs in 2 months.

## 2011-07-16 NOTE — Assessment & Plan Note (Signed)
Patient has exertional dyspnea with a component of diastolic CHF.  However, I think that the main factor is obesity and deconditioning.  His functional status is poor.  He is not very active at all.  NYHA class III symptoms.  He seems to be a bit more active than at last appointment and weight is down 7 lbs.  Echo in 3/13 showed EF 50-55%.  - Continue current Lasix dosing.

## 2011-07-18 ENCOUNTER — Encounter (HOSPITAL_COMMUNITY): Payer: Medicare Other

## 2011-07-18 ENCOUNTER — Ambulatory Visit (HOSPITAL_COMMUNITY): Payer: Medicare Other

## 2011-07-20 ENCOUNTER — Ambulatory Visit (HOSPITAL_COMMUNITY): Payer: Medicare Other

## 2011-07-20 ENCOUNTER — Encounter (HOSPITAL_COMMUNITY): Payer: Medicare Other

## 2011-07-25 ENCOUNTER — Encounter (HOSPITAL_COMMUNITY): Payer: Medicare Other

## 2011-07-25 ENCOUNTER — Ambulatory Visit (HOSPITAL_COMMUNITY): Payer: Medicare Other

## 2011-07-27 ENCOUNTER — Encounter (HOSPITAL_COMMUNITY): Payer: Medicare Other

## 2011-07-27 ENCOUNTER — Ambulatory Visit (HOSPITAL_COMMUNITY): Payer: Medicare Other

## 2011-07-30 ENCOUNTER — Encounter (HOSPITAL_COMMUNITY): Payer: Medicare Other

## 2011-08-01 ENCOUNTER — Ambulatory Visit (HOSPITAL_COMMUNITY): Payer: Medicare Other

## 2011-08-01 ENCOUNTER — Encounter (HOSPITAL_COMMUNITY): Payer: Medicare Other

## 2011-08-03 ENCOUNTER — Encounter (HOSPITAL_COMMUNITY): Payer: Medicare Other

## 2011-08-03 ENCOUNTER — Ambulatory Visit (HOSPITAL_COMMUNITY): Payer: Medicare Other

## 2011-08-06 ENCOUNTER — Encounter (HOSPITAL_COMMUNITY): Payer: Medicare Other

## 2011-08-08 ENCOUNTER — Ambulatory Visit (HOSPITAL_COMMUNITY): Payer: Medicare Other

## 2011-08-08 ENCOUNTER — Encounter (HOSPITAL_COMMUNITY): Payer: Medicare Other

## 2011-08-10 ENCOUNTER — Encounter (HOSPITAL_COMMUNITY): Payer: Medicare Other

## 2011-08-10 ENCOUNTER — Ambulatory Visit (HOSPITAL_COMMUNITY): Payer: Medicare Other

## 2011-08-13 ENCOUNTER — Encounter (HOSPITAL_COMMUNITY): Payer: Medicare Other

## 2011-08-15 ENCOUNTER — Encounter (HOSPITAL_COMMUNITY): Payer: Medicare Other

## 2011-08-15 ENCOUNTER — Ambulatory Visit (HOSPITAL_COMMUNITY): Payer: Medicare Other

## 2011-08-17 ENCOUNTER — Encounter (HOSPITAL_COMMUNITY): Payer: Medicare Other

## 2011-08-17 ENCOUNTER — Ambulatory Visit (HOSPITAL_COMMUNITY): Payer: Medicare Other

## 2011-08-20 ENCOUNTER — Encounter (HOSPITAL_COMMUNITY): Payer: Medicare Other

## 2011-08-22 ENCOUNTER — Ambulatory Visit (HOSPITAL_COMMUNITY): Payer: Medicare Other

## 2011-08-22 ENCOUNTER — Encounter (HOSPITAL_COMMUNITY): Payer: Medicare Other

## 2011-08-24 ENCOUNTER — Ambulatory Visit (HOSPITAL_COMMUNITY): Payer: Medicare Other

## 2011-08-24 ENCOUNTER — Encounter (HOSPITAL_COMMUNITY): Payer: Medicare Other

## 2011-08-27 ENCOUNTER — Encounter (HOSPITAL_COMMUNITY): Payer: Medicare Other

## 2011-08-29 ENCOUNTER — Encounter (HOSPITAL_COMMUNITY): Payer: Medicare Other

## 2011-08-29 ENCOUNTER — Ambulatory Visit (HOSPITAL_COMMUNITY): Payer: Medicare Other

## 2011-08-31 ENCOUNTER — Encounter (HOSPITAL_COMMUNITY): Payer: Medicare Other

## 2011-08-31 ENCOUNTER — Ambulatory Visit (HOSPITAL_COMMUNITY): Payer: Medicare Other

## 2011-09-03 ENCOUNTER — Encounter (HOSPITAL_COMMUNITY): Payer: Medicare Other

## 2011-09-05 ENCOUNTER — Encounter (HOSPITAL_COMMUNITY): Payer: Medicare Other

## 2011-09-05 ENCOUNTER — Ambulatory Visit (HOSPITAL_COMMUNITY): Payer: Medicare Other

## 2011-09-07 ENCOUNTER — Encounter (HOSPITAL_COMMUNITY): Payer: Medicare Other

## 2011-09-07 ENCOUNTER — Ambulatory Visit (HOSPITAL_COMMUNITY): Payer: Medicare Other

## 2011-09-10 ENCOUNTER — Encounter (HOSPITAL_COMMUNITY): Payer: Medicare Other

## 2011-09-12 ENCOUNTER — Encounter (HOSPITAL_COMMUNITY): Payer: Medicare Other

## 2011-09-12 ENCOUNTER — Ambulatory Visit (HOSPITAL_COMMUNITY): Payer: Medicare Other

## 2011-09-14 ENCOUNTER — Ambulatory Visit (HOSPITAL_COMMUNITY): Payer: Medicare Other

## 2011-09-14 ENCOUNTER — Encounter (HOSPITAL_COMMUNITY): Payer: Medicare Other

## 2011-09-14 ENCOUNTER — Other Ambulatory Visit (INDEPENDENT_AMBULATORY_CARE_PROVIDER_SITE_OTHER): Payer: Medicare Other

## 2011-09-14 DIAGNOSIS — E785 Hyperlipidemia, unspecified: Secondary | ICD-10-CM

## 2011-09-14 LAB — HEPATIC FUNCTION PANEL
ALT: 34 U/L (ref 0–53)
AST: 30 U/L (ref 0–37)
Albumin: 3.2 g/dL — ABNORMAL LOW (ref 3.5–5.2)
Alkaline Phosphatase: 104 U/L (ref 39–117)
Bilirubin, Direct: 0.1 mg/dL (ref 0.0–0.3)
Total Bilirubin: 0.5 mg/dL (ref 0.3–1.2)
Total Protein: 6.9 g/dL (ref 6.0–8.3)

## 2011-09-14 LAB — LIPID PANEL
Total CHOL/HDL Ratio: 2
Triglycerides: 108 mg/dL (ref 0.0–149.0)

## 2011-09-19 ENCOUNTER — Ambulatory Visit (HOSPITAL_COMMUNITY): Payer: Medicare Other

## 2011-09-21 ENCOUNTER — Ambulatory Visit (HOSPITAL_COMMUNITY): Payer: Medicare Other

## 2011-09-26 ENCOUNTER — Ambulatory Visit (HOSPITAL_COMMUNITY): Payer: Medicare Other

## 2011-09-28 ENCOUNTER — Ambulatory Visit (HOSPITAL_COMMUNITY): Payer: Medicare Other

## 2011-10-03 ENCOUNTER — Ambulatory Visit (HOSPITAL_COMMUNITY): Payer: Medicare Other

## 2011-10-05 ENCOUNTER — Ambulatory Visit (HOSPITAL_COMMUNITY): Payer: Medicare Other

## 2011-11-15 ENCOUNTER — Encounter: Payer: Self-pay | Admitting: Cardiology

## 2011-11-15 ENCOUNTER — Ambulatory Visit (INDEPENDENT_AMBULATORY_CARE_PROVIDER_SITE_OTHER): Payer: Medicare Other | Admitting: Cardiology

## 2011-11-15 VITALS — BP 116/62 | HR 62 | Resp 18 | Ht 74.0 in | Wt 344.1 lb

## 2011-11-15 DIAGNOSIS — I2510 Atherosclerotic heart disease of native coronary artery without angina pectoris: Secondary | ICD-10-CM

## 2011-11-15 DIAGNOSIS — E785 Hyperlipidemia, unspecified: Secondary | ICD-10-CM

## 2011-11-15 DIAGNOSIS — R0602 Shortness of breath: Secondary | ICD-10-CM

## 2011-11-15 DIAGNOSIS — I10 Essential (primary) hypertension: Secondary | ICD-10-CM

## 2011-11-15 DIAGNOSIS — I509 Heart failure, unspecified: Secondary | ICD-10-CM

## 2011-11-15 DIAGNOSIS — I5032 Chronic diastolic (congestive) heart failure: Secondary | ICD-10-CM

## 2011-11-15 LAB — BASIC METABOLIC PANEL
CO2: 26 mEq/L (ref 19–32)
Chloride: 103 mEq/L (ref 96–112)
Creatinine, Ser: 1.3 mg/dL (ref 0.4–1.5)
Glucose, Bld: 104 mg/dL — ABNORMAL HIGH (ref 70–99)
Sodium: 137 mEq/L (ref 135–145)

## 2011-11-15 NOTE — Progress Notes (Signed)
Patient ID: Benjamin Kaiser, male   DOB: 1934/03/30, 77 y.o.   MRN: [id number] PCP: Dr. Jeannetta Nap  77 yo with history of morbid obesity, diastolic CHF, and CAD presents for cardiology followup.  Patient presented to [facility] with chest pain in 8/12 and was found to have NSTEMI.  Left heart cath showed a total occlusion of the proximal LAD with collaterals from the RCA.  There were serial 90% stenoses in the circumflex.  He was evaluated for CABG but was turned down due to obesity and poor functional status.  He had a drug-eluting stent placed in the CFX and the LAD was not intervened upon (chronic total occlusion).   Most recent echo in 3/13 showed EF 50-55% with mild AS.   He is not very active and mobility is poor.  He can now walk in the house without significant dyspnea but is short of breath walking more than 50-60 feet.  Weight is down 13 lbs since last appointment but his diet is still poor.  He is not having chest pain.  He decided not to do cardiac rehab because he did not think he could handle the degree of exertion that would be necessary.  He continues to have chronic pain in his neck and bilateral knees.    Labs (8/12): K 3.8, creatinine 1.05 Labs (10/12): BNP 64 Labs (11/12): K 3.4, creatinine 1.1, HDL 46, TGs 217, LDL 129 Labs (12/12): K 4.1, creatinine 1.0 Labs (1/13): LDL 132, HDL 48 Labs (3/13): LDL 57, HDL 54, K 4.4, creatinine 1.1, proBNP 36 Labs (7/13): LDL 53, HDL 51  PMH: 1. CVA 2. HTN 3. Right lower leg DVT in 2003 4. Obesity 5. CAD: NSTEMI (8/12).  LHC with total occlusion LAD and collaterals from RCA, 90% serial CFX stenoses.  Patient was evaluated for CABG but was turned down by CVTS given obesity and poor functional status.  He had DES to CFX placed instead.   6. Diastolic CHF: Echo (8/12) with EF 45-50%, anteroseptal hypokinesis, mild LV hypertrophy, normal RV, aortic sclerosis.  Echo (3/13) with EF 50-55%, mild AS.  7. Hyperlipidemia 8. Gout 9. Seizure disorder 10.  Bladder calculi 11. BPH 12. Aortic stenosis: Mild on 3/13 echo.  13. GERD  SH: Lives in [place], married.  1 living son.  Prior smoker.   FH: 2 sons died of MIs in their 19s.  Another son committed suicide.     Current Outpatient Prescriptions  Medication Sig Dispense Refill  . allopurinol (ZYLOPRIM) 300 MG tablet Take 300 mg by mouth daily.        Marland Kitchen aspirin 81 MG tablet Take 81 mg by mouth daily.        Marland Kitchen atorvastatin (LIPITOR) 80 MG tablet Take 1 tablet (80 mg total) by mouth daily.  90 tablet  3  . bisacodyl (DULCOLAX) 5 MG EC tablet Take 5 mg by mouth daily as needed.        . clopidogrel (PLAVIX) 75 MG tablet Take 1 tablet (75 mg total) by mouth daily.  90 tablet  3  . ezetimibe (ZETIA) 10 MG tablet Take 1 tablet (10 mg total) by mouth daily.  90 tablet  3  . furosemide (LASIX) 40 MG tablet Take 1 tablet (40 mg total) by mouth daily.  90 tablet  3  . ibuprofen (ADVIL,MOTRIN) 200 MG tablet Take 200 mg by mouth every 6 (six) hours as needed.      . isosorbide mononitrate (IMDUR) 30 MG 24 hr tablet  Take 1 tablet (30 mg total) by mouth daily.  90 tablet  3  . lisinopril (PRINIVIL,ZESTRIL) 5 MG tablet Take 1 tablet (5 mg total) by mouth daily.  90 tablet  3  . metoprolol succinate (TOPROL-XL) 50 MG 24 hr tablet Take 1 tablet (50 mg total) by mouth daily. Take with or immediately following a meal.  90 tablet  3  . Multiple Vitamins-Minerals (EYE VITAMINS) CAPS Take 1 capsule by mouth daily.        . niacin (NIASPAN) 1000 MG CR tablet Take 1 tablet (1,000 mg total) by mouth at bedtime.  90 tablet  3  . nitroGLYCERIN (NITROSTAT) 0.4 MG SL tablet Place 0.4 mg under the tongue every 5 (five) minutes as needed.        . phenytoin (DILANTIN) 100 MG ER capsule Take 100 mg by mouth. Taking 2 Tablets in A.M and 2 in P.M      . potassium chloride SA (K-DUR,KLOR-CON) 20 MEQ tablet Take 1 tablet (20 mEq total) by mouth 2 (two) times daily.  180 tablet  3  . Tamsulosin HCl (FLOMAX) 0.4 MG CAPS Take 0.4  mg by mouth daily.        BP 116/62  Pulse 62  Resp 18  Ht 6\' 2"  (1.88 m)  Wt 344 lb 1.9 oz (156.092 kg)  BMI 44.18 kg/m2  SpO2 97% General: Obese, NAD Neck: Thick neck, JVP difficult, no thyromegaly or thyroid nodule.  Lungs: Clear to auscultation bilaterally with normal respiratory effort. CV: Nondisplaced PMI.  Heart regular S1/S2, no S3/S4, 1/6 SEM RUSB.  Trace ankle edema.  No carotid bruit.  Normal pedal pulses.  Abdomen: Soft, nontender, no hepatosplenomegaly, no distention.   Neurologic: Alert and oriented x 3.  Psych: Normal affect. Extremities: No clubbing or cyanosis.   Assessment/Plan CAD (coronary artery disease)  S/p PCI to CFX. Also has chronic total occlusion of LAD with collaterals from the RCA. Patient was seen by CVTS and deemed a poor CABG candidate due to obesity and poor functional status. No recent chest pain.  EF low normal on recent echo. - Continue ASA 81, Plavix, Imdur, Toprol XL, ACEI, and statin.  Diastolic CHF, chronic Patient has exertional dyspnea with a component of diastolic CHF. However, I think that the main factor is obesity and deconditioning. His functional status is poor. He is not very active at all. NYHA class III symptoms.  He does not appear significantly volume overloaded at this time.  - Continue current Lasix dosing but will check BMET and BNP.  Morbid obesity  Weight loss is imperative. He is actually down about 13 lbs though diet is still poor.  He has been eating less overall.  He has a ways to go.  I encouraged him to try to walk more.  Hyperlipidemia Excellent lipids when last checked on atorvastatin and Zetia.  Goal LDL < 70.   Marca Ancona 11/15/2011

## 2011-11-15 NOTE — Patient Instructions (Addendum)
Your physician recommends that you have lab work today--BMET/BNP.  Your physician wants you to follow-up in: 6 months with Dr Shirlee Latch. (March 2014). You will receive a reminder letter in the mail two months in advance. If you don't receive a letter, please call our office to schedule the follow-up appointment.   Your physician recommends that you have a FASTING lipid profile: in 6 months when you see  Dr Shirlee Latch.

## 2012-02-06 ENCOUNTER — Telehealth: Payer: Self-pay | Admitting: Cardiology

## 2012-02-06 DIAGNOSIS — E785 Hyperlipidemia, unspecified: Secondary | ICD-10-CM

## 2012-02-06 MED ORDER — FUROSEMIDE 40 MG PO TABS: 40.0000 mg | ORAL_TABLET | Freq: Every day | ORAL | Status: DC

## 2012-02-06 MED ORDER — EZETIMIBE 10 MG PO TABS: 10.0000 mg | ORAL_TABLET | Freq: Every day | ORAL | Status: DC

## 2012-02-06 MED ORDER — NIACIN ER (ANTIHYPERLIPIDEMIC) 1000 MG PO TBCR: 1000.0000 mg | EXTENDED_RELEASE_TABLET | Freq: Every day | ORAL | Status: DC

## 2012-02-06 MED ORDER — ISOSORBIDE MONONITRATE ER 30 MG PO TB24: 30.0000 mg | ORAL_TABLET | Freq: Every day | ORAL | Status: DC

## 2012-02-06 MED ORDER — METOPROLOL SUCCINATE ER 50 MG PO TB24: 50.0000 mg | ORAL_TABLET | Freq: Every day | ORAL | Status: DC

## 2012-02-06 MED ORDER — LISINOPRIL 5 MG PO TABS: 5.0000 mg | ORAL_TABLET | Freq: Every day | ORAL | Status: DC

## 2012-02-06 MED ORDER — POTASSIUM CHLORIDE CRYS ER 20 MEQ PO TBCR: 20.0000 meq | EXTENDED_RELEASE_TABLET | Freq: Two times a day (BID) | ORAL | Status: DC

## 2012-02-06 MED ORDER — ATORVASTATIN CALCIUM 80 MG PO TABS: 80.0000 mg | ORAL_TABLET | Freq: Every day | ORAL | Status: DC

## 2012-02-06 MED ORDER — CLOPIDOGREL BISULFATE 75 MG PO TABS: 75.0000 mg | ORAL_TABLET | Freq: Every day | ORAL | Status: DC

## 2012-02-06 MED ORDER — NITROGLYCERIN 0.4 MG SL SUBL: 0.4000 mg | SUBLINGUAL_TABLET | SUBLINGUAL | Status: DC | PRN

## 2012-02-06 NOTE — Telephone Encounter (Signed)
Called pt's wife, pt is needing all medications sent to mail order pharmacy [facility].  Pt's wife stated it would be better if Rx's where sent straight from here.  Caralee Ates, CMA   Refilled all medications to [facility].  Caralee Ates, CMA

## 2012-02-06 NOTE — Telephone Encounter (Signed)
New problem:   Wife is asking for all medication Rx to be sent to her.

## 2012-02-18 ENCOUNTER — Telehealth: Payer: Self-pay | Admitting: Cardiology

## 2012-02-18 NOTE — Telephone Encounter (Signed)
He walks a little ways and he is worn out, hands trembling, and feels like his knees are going to buckle under him.  Last week he fell going to the bathroom, legs gave away. Wife states he just wants to lay in bed all of the time.  Wife concerned coming from medications.  Not having chest pains but having rib pain (recent virus with lots of coughing) and husband feels coming from that.  Has not used any NTG since heart attack (last August). Did have a couple of episodes of after taking medications in the evening when he did get really red and flushed, that has subsided.

## 2012-02-18 NOTE — Telephone Encounter (Signed)
Spoke with Dr [place] (dod) since Dr Shirlee Latch out of the office.No medication changes, schedule appointment, and go to ED if worsening in symptoms, this issue has been going on for weeks per wife. Advised wife, verbalized understanding.  Did schedule appointment for January 7 with Bing Neighbors. PA.  Will forward to [employer] and Dr Shirlee Latch

## 2012-02-18 NOTE — Telephone Encounter (Signed)
New Problem:    Patient's wfie called in because her husband is experiencing SOB, is very easily fatigued, has fallen 3 times since thanksgiving, and has irregular bowel movements.  Was wondering if it was due to the patient's medication.  Please call back.

## 2012-03-04 ENCOUNTER — Encounter: Payer: Self-pay | Admitting: Physician Assistant

## 2012-03-04 ENCOUNTER — Ambulatory Visit (INDEPENDENT_AMBULATORY_CARE_PROVIDER_SITE_OTHER): Payer: Medicare Other | Admitting: Physician Assistant

## 2012-03-04 ENCOUNTER — Other Ambulatory Visit (INDEPENDENT_AMBULATORY_CARE_PROVIDER_SITE_OTHER): Payer: Medicare Other

## 2012-03-04 ENCOUNTER — Ambulatory Visit: Payer: Medicare Other | Admitting: Physician Assistant

## 2012-03-04 ENCOUNTER — Ambulatory Visit (HOSPITAL_COMMUNITY)
Admission: RE | Admit: 2012-03-04 | Discharge: 2012-03-04 | Disposition: A | Discharge status: Home / Self Care | Payer: Medicare Other | Source: Ambulatory Visit | Attending: Physician Assistant | Admitting: Physician Assistant

## 2012-03-04 VITALS — BP 136/73 | HR 59 | Ht 74.0 in | Wt 339.4 lb

## 2012-03-04 DIAGNOSIS — R5383 Other fatigue: Secondary | ICD-10-CM

## 2012-03-04 DIAGNOSIS — R0602 Shortness of breath: Secondary | ICD-10-CM | POA: Insufficient documentation

## 2012-03-04 DIAGNOSIS — R079 Chest pain, unspecified: Secondary | ICD-10-CM | POA: Insufficient documentation

## 2012-03-04 DIAGNOSIS — R531 Weakness: Secondary | ICD-10-CM

## 2012-03-04 DIAGNOSIS — R5381 Other malaise: Secondary | ICD-10-CM | POA: Insufficient documentation

## 2012-03-04 DIAGNOSIS — I2510 Atherosclerotic heart disease of native coronary artery without angina pectoris: Secondary | ICD-10-CM

## 2012-03-04 DIAGNOSIS — G40909 Epilepsy, unspecified, not intractable, without status epilepticus: Secondary | ICD-10-CM

## 2012-03-04 DIAGNOSIS — R001 Bradycardia, unspecified: Secondary | ICD-10-CM

## 2012-03-04 DIAGNOSIS — R259 Unspecified abnormal involuntary movements: Secondary | ICD-10-CM

## 2012-03-04 DIAGNOSIS — I5032 Chronic diastolic (congestive) heart failure: Secondary | ICD-10-CM

## 2012-03-04 DIAGNOSIS — R251 Tremor, unspecified: Secondary | ICD-10-CM

## 2012-03-04 DIAGNOSIS — Z8673 Personal history of transient ischemic attack (TIA), and cerebral infarction without residual deficits: Secondary | ICD-10-CM

## 2012-03-04 DIAGNOSIS — I498 Other specified cardiac arrhythmias: Secondary | ICD-10-CM

## 2012-03-04 DIAGNOSIS — I517 Cardiomegaly: Secondary | ICD-10-CM | POA: Insufficient documentation

## 2012-03-04 LAB — CBC WITH DIFFERENTIAL/PLATELET
Eosinophils Relative: 3.5 % (ref 0.0–5.0)
HCT: 43 % (ref 39.0–52.0)
Hemoglobin: 14.5 g/dL (ref 13.0–17.0)
Lymphs Abs: 2 10*3/uL (ref 0.7–4.0)
Monocytes Relative: 6.6 % (ref 3.0–12.0)
Neutro Abs: 6.1 10*3/uL (ref 1.4–7.7)
WBC: 9.1 10*3/uL (ref 4.5–10.5)

## 2012-03-04 LAB — BASIC METABOLIC PANEL
GFR: 61.44 mL/min (ref >60.00)
Potassium: 4.5 mEq/L (ref 3.5–5.1)
Sodium: 137 mEq/L (ref 135–145)

## 2012-03-04 LAB — TSH: TSH: 1.38 u[IU]/mL (ref 0.35–5.50)

## 2012-03-04 MED ORDER — METOPROLOL SUCCINATE ER 25 MG PO TB24: 25.0000 mg | ORAL_TABLET | Freq: Every day | ORAL | Status: DC

## 2012-03-04 NOTE — Progress Notes (Signed)
[address], [address] [place], [place]  [address] Phone: [phone], Fax:  [phone]  Date:  03/04/2012   Name:  Benjamin Kaiser   DOB:  07/10/1934   MRN:  [id number]  PCP:  Kaleen Mask, MD  Primary Cardiologist:  Dr. Marca Ancona   Primary Electrophysiologist:  None    History of Present Illness: Benjamin Kaiser is a 78 y.o. male who presents today for further evaluation weakness and dyspnea.  He has a hx of morbid obesity, diastolic CHF, and CAD, s/p NSTEMI 8/12. LHC in 8/12:  total occlusion of the proximal LAD with collaterals from the RCA, serial 90% stenoses in CFX. He was evaluated for CABG but was turned down due to obesity and poor functional status. PCI:  DES to the CFX and the LAD was not intervened upon (chronic total occlusion). Most recent echo in 3/13 showed EF 50-55% with mild AS.   He is chronically not that active and has chronic shortness of breath. He is here with his wife and son who report that over the last 3 weeks he has been exhibiting signs of increased weakness as well as worsening dyspnea. He probably describes class III-IIIb symptoms.  He has chest pain. This is substernal. It is heavy. He has noted this pain off and on for quite some time. It is unchanged. He denies radiating symptoms. He denies symptoms reminiscent of his previous angina. He denies associated dyspnea or nausea. He is not certain that it comes on with activity. He has been falling. He reports 3-4 falls since Thanksgiving. He states that his legs are weak. He did hit his right posterior ribs on one fall and these remained painful. He denies syncope or near-syncope. He denies dizziness or lightheadedness.  Labs (8/12):     K 3.8, creatinine 1.05  Labs (10/12):   BNP 64  Labs (11/12):   K 3.4, creatinine 1.1, HDL 46, TGs 217, LDL 129  Labs (12/12):   K 4.1, creatinine 1.0  Labs (1/13):     LDL 132, HDL 48  Labs (3/13):     LDL 57, HDL 54, K 4.4, creatinine 1.1, proBNP 36    Labs (7/13):     LDL 53, HDL 51  Labs (9/13):     K 4.2, creatinine 1.3, proBNP 22  Wt Readings from Last 3 Encounters:  03/04/12 339 lb 6.4 oz (153.951 kg)  11/15/11 344 lb 1.9 oz (156.092 kg)  07/16/11 350 lb (158.759 kg)     Past Medical History  Diagnosis Date  . Hypertension   . Seizure disorder   . Hyperlipidemia   . Bradycardia     secondary to Atenolol  . Gout   . Obesity   . DVT (deep venous thrombosis) 2003    Right lower extremity  . Bladder calculi     status cystoscopy  . BPH (benign prostatic hypertrophy)     status post TURP  . History of stroke   . CAD (coronary artery disease)     a. NSTEMI (8/12). LHC with total occlusion LAD and collaterals from RCA, 90% serial CFX stenoses. Patient was evaluated for CABG but was turned down by CVTS given obesity and poor functional status. He had DES to CFX placed instead.   . Chronic diastolic heart failure     a. Echo (8/12) with EF 45-50%, anteroseptal hypokinesis, mild LV hypertrophy, normal RV, aortic sclerosis;  b. Echo (3/13) with EF 50-55%, mild AS.   Marland Kitchen Aortic  stenosis     a. mild by echo 04/2011  . GERD (gastroesophageal reflux disease)     Current Outpatient Prescriptions  Medication Sig Dispense Refill  . isosorbide mononitrate (IMDUR) 30 MG 24 hr tablet Take 1 tablet (30 mg total) by mouth daily.  90 tablet  3  . niacin (NIASPAN) 1000 MG CR tablet Take 1 tablet (1,000 mg total) by mouth at bedtime.  90 tablet  3  . potassium chloride SA (K-DUR,KLOR-CON) 20 MEQ tablet Take 1 tablet (20 mEq total) by mouth 2 (two) times daily.  180 tablet  3  . Tamsulosin HCl (FLOMAX) 0.4 MG CAPS Take 0.4 mg by mouth daily.      Marland Kitchen allopurinol (ZYLOPRIM) 300 MG tablet Take 300 mg by mouth daily.        Marland Kitchen aspirin 81 MG tablet Take 81 mg by mouth daily.        Marland Kitchen atorvastatin (LIPITOR) 80 MG tablet Take 1 tablet (80 mg total) by mouth daily.  90 tablet  3  . bisacodyl (DULCOLAX) 5 MG EC tablet Take 5 mg by mouth daily as needed.         . clopidogrel (PLAVIX) 75 MG tablet Take 1 tablet (75 mg total) by mouth daily.  90 tablet  3  . ezetimibe (ZETIA) 10 MG tablet Take 1 tablet (10 mg total) by mouth daily.  90 tablet  3  . furosemide (LASIX) 40 MG tablet Take 1 tablet (40 mg total) by mouth daily.  90 tablet  3  . ibuprofen (ADVIL,MOTRIN) 200 MG tablet Take 200 mg by mouth every 6 (six) hours as needed.      Marland Kitchen lisinopril (PRINIVIL,ZESTRIL) 5 MG tablet Take 1 tablet (5 mg total) by mouth daily.  90 tablet  3  . metoprolol succinate (TOPROL-XL) 50 MG 24 hr tablet Take 1 tablet (50 mg total) by mouth daily. Take with or immediately following a meal.  90 tablet  3  . Multiple Vitamins-Minerals (EYE VITAMINS) CAPS Take 1 capsule by mouth daily.        . nitroGLYCERIN (NITROSTAT) 0.4 MG SL tablet Place 1 tablet (0.4 mg total) under the tongue every 5 (five) minutes as needed.  100 tablet  3  . phenytoin (DILANTIN) 100 MG ER capsule Take 100 mg by mouth. Taking 2 Tablets in A.M and 2 in P.M        Allergies:  No Known Allergies  Social History:  The patient  reports that he quit smoking about 23 years ago. His smoking use included Cigarettes. He does not have any smokeless tobacco history on file. He reports that he does not drink alcohol or use illicit drugs.   ROS:  Please see the history of present illness.   He has noted a resting tremor in his right hand over the last several weeks. He also notes some weakness in his right leg greater than his left leg. He denies any facial droop or speech difficulty. He had a recent URI. Symptoms are resolving. He had diarrhea last week but is now constipated. He denies any melena or hematochezia.   All other systems reviewed and negative.   PHYSICAL EXAM: VS:  BP 136/73  Pulse 59  Ht 6\' 2"  (1.88 m)  Wt 339 lb 6.4 oz (153.951 kg)  BMI 43.58 kg/m2 Chronically ill-appearing male in no acute distress sitting in a wheelchair HEENT: normal Neck: no JVD at 90 Cardiac:  normal S1, S2; RRR;  1/6 systolic murmur Lungs:  Decreased breath sounds bilaterally, no wheezing, rhonchi or rales Abd: soft, nontender, no hepatomegaly Ext: Trace bilateral ankle edema Skin: warm and dry Neuro:  CNs 2-12 intact, no focal abnormalities noted  EKG:  Sinus bradycardia, HR 58, leftward axis, poor R wave progression, interventricular conduction delay, no significant change compared to prior tracing    ASSESSMENT AND PLAN:   1. Weakness:  Etiology not entirely clear. He has poor functional capacity. It is uncertain whether or not this is clearly significantly worse. It appears that he describes worsening shortness of breath with exertion. He has fallen several times as well. He denies any syncope or near-syncope. He does have bradycardia. He is on chronic beta blocker therapy. He has a history of diastolic CHF in the past. He also has known CAD with a chronic totally occluded LAD. He has had some chest pain in the past. This symptom seems to be stable. I discussed his case today further with Dr. Shirlee Latch.  -  Decrease Toprol-XL to 25 mg daily.  -  Obtain 24 hr holter to rule out significant bradyarrhythmias.  -  Labs:  BMET, CBC, TSH, BNP, Dilantin level.  -  Check CXR.  -  Check Head CT without contrast to rule out recent stroke especially with new onset tremor.  -  Refer to Neurology.  2. Resting Tremor:  Obtain Head CT and send to neuro as noted above.  3. Coronary Artery Disease:  D/w Dr. Marca Ancona.  We did not feel that a nuclear scan would be that helpful.  Workup as noted above.  Plan close follow up.  4. Diastolic CHF:  Does not look volume overloaded.  Check BNP and CXR.    5. Seizure Disorder:  Plan Dilantin level today.  Refer to Neuro.  6. Disposition:  Follow up with Dr. Marca Ancona or me in 2-3 weeks.  Luna Glasgow, PA-C  11:37 AM 03/04/2012

## 2012-03-04 NOTE — Patient Instructions (Addendum)
DECREASE TOPROL XL TO 25 MG DAILY  LABS TODAY AT [facility] ON [address] FROM [place] ( BMJET, CBC W/DIFF, TSH, BNP DILANTIN LEVEL)  CHEST X-RAY 786.05 AT [facility] AS WELL TODAY  PLEASE SCHEDULE A HEAD CT W/O CONTRAST DX NEW TREMOR, H/O CVA, WEAKNESS  REFER TO NEUROLOGY; NEW RIGHT RESTING TREMOR, H/O CVA, WEAKNESS, SEIZURE DISORDER  48 HOUR HOLTER NEEDED DX BRADYCARDIA AND WEAKNESS  FOLLOW UP WITH SCOTT WEAVER, PAC  OR DR. Shirlee Latch IN 2 WEEKS

## 2012-03-05 ENCOUNTER — Telehealth: Payer: Self-pay | Admitting: *Deleted

## 2012-03-05 ENCOUNTER — Telehealth: Payer: Self-pay | Admitting: Cardiology

## 2012-03-05 NOTE — Telephone Encounter (Signed)
New Problem:    Patient wife called in wanting to speak with you again.  Dr. Jeannetta Nap would like you to fax over all of the results form the patient's test fax: [phone].  Please call back if you have any questions.

## 2012-03-05 NOTE — Telephone Encounter (Signed)
I faxed over results and ov note and ekg from 03/04/12 to Dr. Jeannetta Nap per pt request

## 2012-03-05 NOTE — Telephone Encounter (Signed)
wife notified about lab results, dilantin level way too way per [facility]. PAC, change dilantin to 200 mg AM/100 mg PM per [employer]. PAC, needs f/u this week with his PCP to f/u on dilantin level and doseage per [employer]. PAC, wife verbalized understanding today

## 2012-03-06 ENCOUNTER — Inpatient Hospital Stay: Admission: RE | Admit: 2012-03-06 | Payer: Medicare Other | Source: Ambulatory Visit

## 2012-03-10 ENCOUNTER — Inpatient Hospital Stay: Admission: RE | Admit: 2012-03-10 | Payer: Medicare Other | Source: Ambulatory Visit

## 2012-03-17 ENCOUNTER — Ambulatory Visit (INDEPENDENT_AMBULATORY_CARE_PROVIDER_SITE_OTHER): Payer: Medicare Other

## 2012-03-17 ENCOUNTER — Ambulatory Visit (INDEPENDENT_AMBULATORY_CARE_PROVIDER_SITE_OTHER)
Admission: RE | Admit: 2012-03-17 | Discharge: 2012-03-17 | Disposition: A | Discharge status: Home / Self Care | Payer: Medicare Other | Source: Ambulatory Visit | Attending: Physician Assistant | Admitting: Physician Assistant

## 2012-03-17 DIAGNOSIS — R001 Bradycardia, unspecified: Secondary | ICD-10-CM

## 2012-03-17 DIAGNOSIS — Z8673 Personal history of transient ischemic attack (TIA), and cerebral infarction without residual deficits: Secondary | ICD-10-CM

## 2012-03-17 DIAGNOSIS — R531 Weakness: Secondary | ICD-10-CM

## 2012-03-17 DIAGNOSIS — R259 Unspecified abnormal involuntary movements: Secondary | ICD-10-CM

## 2012-03-17 DIAGNOSIS — R5383 Other fatigue: Secondary | ICD-10-CM

## 2012-03-17 DIAGNOSIS — R5381 Other malaise: Secondary | ICD-10-CM

## 2012-03-17 DIAGNOSIS — I498 Other specified cardiac arrhythmias: Secondary | ICD-10-CM

## 2012-03-17 NOTE — Progress Notes (Signed)
Placed the patient on 24 hr holter monitor and went over instructions on how to use it and when to return it.

## 2012-03-19 ENCOUNTER — Encounter: Payer: Self-pay | Admitting: Physician Assistant

## 2012-03-19 ENCOUNTER — Ambulatory Visit (INDEPENDENT_AMBULATORY_CARE_PROVIDER_SITE_OTHER): Payer: Medicare Other | Admitting: Physician Assistant

## 2012-03-19 VITALS — BP 102/68 | HR 65 | Ht 74.0 in | Wt 332.8 lb

## 2012-03-19 DIAGNOSIS — R5381 Other malaise: Secondary | ICD-10-CM

## 2012-03-19 DIAGNOSIS — R0602 Shortness of breath: Secondary | ICD-10-CM

## 2012-03-19 DIAGNOSIS — R531 Weakness: Secondary | ICD-10-CM

## 2012-03-19 DIAGNOSIS — I2510 Atherosclerotic heart disease of native coronary artery without angina pectoris: Secondary | ICD-10-CM

## 2012-03-19 DIAGNOSIS — I5032 Chronic diastolic (congestive) heart failure: Secondary | ICD-10-CM

## 2012-03-19 NOTE — Patient Instructions (Addendum)
NO CHANGES WERE MADE TODAY  PLEASE FOLLOW UP WITH  DR. Shirlee Latch 04/28/12 @ 11:30 am

## 2012-03-19 NOTE — Progress Notes (Signed)
[address], [address] [place], [place]  [address] Phone: [phone], Fax:  [phone]  Date:  03/19/2012   ID:  Benjamin Kaiser, DOB February 26, 1935, MRN [id number]  PCP:  Kaleen Mask, MD  Primary Cardiologist:  Dr. Marca Ancona     History of Present Illness: Benjamin Kaiser is a 78 y.o. male who returns for followup.  He has a hx of morbid obesity, diastolic CHF, and CAD, s/p NSTEMI 8/12. LHC in 8/12: total occlusion of the proximal LAD with collaterals from the RCA, serial 90% stenoses in CFX. He was evaluated for CABG but was turned down due to obesity and poor functional status. PCI: DES to the CFX and the LAD was not intervened upon (chronic total occlusion). Most recent echo in 3/13 showed EF 50-55% with mild AS.   He is chronically not that active and has chronic shortness of breath. I saw him 1/7 with c/o's of increased weakness as well as worsening dyspnea.  He also reported chest pain.  He had evidence of bradycardia on his ECG. I reviewed his case with Dr. Shirlee Latch. We opted to decrease his Toprol. We also placed him on a 24-hour Holter. We did not feel that a nuclear scan would be all that helpful and decided to hold off on this. I ordered labs and his Dilantin level returned elevated. We cut back on the dose and had him followup with his PCP. He also had a head CT performed. This demonstrated a remote right parietal infarct new from 11/2008.  He has been referred to neurology. His appointment is later this week.  Patient notes that his dyspnea is about the same. His weakness is slightly improved. He continues to note NYHA class III symptoms. Denies orthopnea, PND or significant pedal edema. He does have atypical right-sided chest pain. This is nonexertional. He denies syncope.  Labs (8/12):    K 3.8, creatinine 1.05  Labs (10/12):  BNP 64  Labs (11/12):  K 3.4, creatinine 1.1, HDL 46, TGs 217, LDL 129  Labs (12/12):  K 4.1, creatinine 1.0  Labs (1/13):    LDL  132, HDL 48  Labs (3/13):    LDL 57, HDL 54, K 4.4, creatinine 1.1, proBNP 36  Labs (7/13):    LDL 53, HDL 51  Labs (9/13):    K 4.2, creatinine 1.3, proBNP 22 Labs (1/14):    K 4.5, creatinine 1.2, Hgb 14.5, TSH 1.38, BNP 27, Dilantin level 36.8  Wt Readings from Last 3 Encounters:  03/19/12 332 lb 12.8 oz (150.957 kg)  03/04/12 339 lb 6.4 oz (153.951 kg)  11/15/11 344 lb 1.9 oz (156.092 kg)     Past Medical History  Diagnosis Date  . Hypertension   . Seizure disorder   . Hyperlipidemia   . Bradycardia     secondary to Atenolol  . Gout   . Obesity   . DVT (deep venous thrombosis) 2003    Right lower extremity  . Bladder calculi     status cystoscopy  . BPH (benign prostatic hypertrophy)     status post TURP  . History of stroke   . CAD (coronary artery disease)     a. NSTEMI (8/12). LHC with total occlusion LAD and collaterals from RCA, 90% serial CFX stenoses. Patient was evaluated for CABG but was turned down by CVTS given obesity and poor functional status. He had DES to CFX placed instead.   . Chronic diastolic heart failure  a. Echo (8/12) with EF 45-50%, anteroseptal hypokinesis, mild LV hypertrophy, normal RV, aortic sclerosis;  b. Echo (3/13) with EF 50-55%, mild AS.   Marland Kitchen Aortic stenosis     a. mild by echo 04/2011  . GERD (gastroesophageal reflux disease)     Current Outpatient Prescriptions  Medication Sig Dispense Refill  . allopurinol (ZYLOPRIM) 300 MG tablet Take 300 mg by mouth daily.        Marland Kitchen aspirin 81 MG tablet Take 81 mg by mouth daily.        Marland Kitchen atorvastatin (LIPITOR) 80 MG tablet Take 1 tablet (80 mg total) by mouth daily.  90 tablet  3  . bisacodyl (DULCOLAX) 5 MG EC tablet Take 5 mg by mouth daily as needed.        . clopidogrel (PLAVIX) 75 MG tablet Take 1 tablet (75 mg total) by mouth daily.  90 tablet  3  . ENSURE (ENSURE) Take 237 mLs by mouth 3 (three) times daily between meals.      Marland Kitchen ezetimibe (ZETIA) 10 MG tablet Take 1 tablet (10 mg total)  by mouth daily.  90 tablet  3  . furosemide (LASIX) 40 MG tablet Take 1 tablet (40 mg total) by mouth daily.  90 tablet  3  . ibuprofen (ADVIL,MOTRIN) 200 MG tablet Take 200 mg by mouth every 6 (six) hours as needed.      . isosorbide mononitrate (IMDUR) 30 MG 24 hr tablet Take 1 tablet (30 mg total) by mouth daily.  90 tablet  3  . lisinopril (PRINIVIL,ZESTRIL) 5 MG tablet Take 1 tablet (5 mg total) by mouth daily.  90 tablet  3  . metoprolol succinate (TOPROL-XL) 25 MG 24 hr tablet Take 1 tablet (25 mg total) by mouth daily. Take with or immediately following a meal.      . Multiple Vitamins-Minerals (EYE VITAMINS) CAPS Take 1 capsule by mouth daily.        . niacin (NIASPAN) 1000 MG CR tablet Take 1 tablet (1,000 mg total) by mouth at bedtime.  90 tablet  3  . nitroGLYCERIN (NITROSTAT) 0.4 MG SL tablet Place 1 tablet (0.4 mg total) under the tongue every 5 (five) minutes as needed.  100 tablet  3  . phenytoin (DILANTIN) 100 MG ER capsule Take 100 mg by mouth. Taking 2 Tablets in A.M and 1 in P.M      . potassium chloride SA (K-DUR,KLOR-CON) 20 MEQ tablet Take 1 tablet (20 mEq total) by mouth 2 (two) times daily.  180 tablet  3  . Tamsulosin HCl (FLOMAX) 0.4 MG CAPS Take 0.4 mg by mouth daily.        Allergies:   No Known Allergies  Social History:  The patient  reports that he quit smoking about 23 years ago. His smoking use included Cigarettes. He does not have any smokeless tobacco history on file. He reports that he does not drink alcohol or use illicit drugs.   ROS:  Please see the history of present illness.   He has a problem with constipation.   All other systems reviewed and negative.   PHYSICAL EXAM: VS:  BP 102/68  Pulse 65  Ht 6\' 2"  (1.88 m)  Wt 332 lb 12.8 oz (150.957 kg)  BMI 42.73 kg/m2 Well nourished, well developed, in no acute distress HEENT: normal Neck: no JVD at 90 Cardiac:  normal S1, S2; RRR; no murmur Lungs:  clear to auscultation bilaterally, no wheezing,  rhonchi or rales Abd:  soft, nontender, no hepatomegaly Ext: Trace bilateral LE edema Skin: warm and dry Neuro:  CNs 2-12 intact, no focal abnormalities noted  EKG:  Sinus rhythm, HR 65, first degree AV block PR 230, poor R wave progression, no significant change compared to prior tracing     ASSESSMENT AND PLAN:  1. Weakness:  I suspect that this was from a combination of Dilantin toxicity and recent stroke. I have recommended no further cardiac workup at this time. He should continue followup with his PCP for management of his Dilantin. He should keep his followup appointment with neurology for his history of stroke. 2. Coronary Artery Disease: Given the findings on his head CT as well as his elevated Dilantin level, I believe that these likely explain his presenting symptoms. No further cardiac workup planned at this time. Continue aspirin, Plavix. 3. Chronic Diastolic CHF: Volume is stable. Recent BNP was normal. 4. Disposition: Followup with PCP as noted above as well as neurology. I will arrange follow up with Dr. Shirlee Latch in 1 month.  Signed, Tereso Newcomer, PA-C  11:45 AM 03/19/2012

## 2012-03-24 ENCOUNTER — Other Ambulatory Visit: Payer: Self-pay | Admitting: Neurology

## 2012-03-24 DIAGNOSIS — I6350 Cerebral infarction due to unspecified occlusion or stenosis of unspecified cerebral artery: Secondary | ICD-10-CM

## 2012-03-24 DIAGNOSIS — I6990 Unspecified sequelae of unspecified cerebrovascular disease: Secondary | ICD-10-CM

## 2012-03-24 DIAGNOSIS — R269 Unspecified abnormalities of gait and mobility: Secondary | ICD-10-CM

## 2012-03-26 ENCOUNTER — Telehealth: Payer: Self-pay | Admitting: *Deleted

## 2012-03-26 NOTE — Telephone Encounter (Signed)
Dr Shirlee Latch reviewed monitor done 03/17/12. No significant arrhythmias. Pt's wife notified.

## 2012-04-02 ENCOUNTER — Ambulatory Visit
Admission: RE | Admit: 2012-04-02 | Discharge: 2012-04-02 | Disposition: A | Discharge status: Home / Self Care | Payer: Medicare Other | Source: Ambulatory Visit | Attending: Neurology | Admitting: Neurology

## 2012-04-02 DIAGNOSIS — I6350 Cerebral infarction due to unspecified occlusion or stenosis of unspecified cerebral artery: Secondary | ICD-10-CM

## 2012-04-02 DIAGNOSIS — R269 Unspecified abnormalities of gait and mobility: Secondary | ICD-10-CM

## 2012-04-02 DIAGNOSIS — I6990 Unspecified sequelae of unspecified cerebrovascular disease: Secondary | ICD-10-CM

## 2012-04-02 MED ORDER — GADOBENATE DIMEGLUMINE 529 MG/ML IV SOLN
20.0000 mL | Freq: Once | INTRAVENOUS | Status: AC | PRN
  Administered 2012-04-02: 20 mL via INTRAVENOUS

## 2012-04-28 ENCOUNTER — Ambulatory Visit (INDEPENDENT_AMBULATORY_CARE_PROVIDER_SITE_OTHER): Payer: Medicare Other | Admitting: Cardiology

## 2012-04-28 ENCOUNTER — Encounter: Payer: Self-pay | Admitting: Cardiology

## 2012-04-28 VITALS — BP 100/70 | HR 68 | Wt 328.0 lb

## 2012-04-28 DIAGNOSIS — I6350 Cerebral infarction due to unspecified occlusion or stenosis of unspecified cerebral artery: Secondary | ICD-10-CM

## 2012-04-28 DIAGNOSIS — E785 Hyperlipidemia, unspecified: Secondary | ICD-10-CM

## 2012-04-28 DIAGNOSIS — R0602 Shortness of breath: Secondary | ICD-10-CM

## 2012-04-28 DIAGNOSIS — I25810 Atherosclerosis of coronary artery bypass graft(s) without angina pectoris: Secondary | ICD-10-CM

## 2012-04-28 DIAGNOSIS — I639 Cerebral infarction, unspecified: Secondary | ICD-10-CM

## 2012-04-28 DIAGNOSIS — I5032 Chronic diastolic (congestive) heart failure: Secondary | ICD-10-CM

## 2012-04-28 DIAGNOSIS — I509 Heart failure, unspecified: Secondary | ICD-10-CM

## 2012-04-28 DIAGNOSIS — I2510 Atherosclerotic heart disease of native coronary artery without angina pectoris: Secondary | ICD-10-CM

## 2012-04-28 LAB — LIPID PANEL
Cholesterol: 141 mg/dL (ref 0–200)
HDL: 48.2 mg/dL (ref >39.00)
LDL Cholesterol: 73 mg/dL (ref 0–99)
Total CHOL/HDL Ratio: 3
Triglycerides: 101 mg/dL (ref 0.0–149.0)

## 2012-04-28 LAB — BASIC METABOLIC PANEL
BUN: 15 mg/dL (ref 6–23)
Calcium: 9.2 mg/dL (ref 8.4–10.5)
Creatinine, Ser: 1.2 mg/dL (ref 0.4–1.5)

## 2012-04-28 NOTE — Patient Instructions (Addendum)
Your physician recommends that you return have lab work today--Lipid profile/BNP/BMP/ Dilantin level.  Your physician has recommended that you wear an event monitor. Event monitors are medical devices that record the heart's electrical activity. Doctors most often [place] these monitors to diagnose arrhythmias. Arrhythmias are problems with the speed or rhythm of the heartbeat. The monitor is a small, portable device. You can wear one while you do your normal daily activities. This is usually used to diagnose what is causing palpitations/syncope (passing out). 30 day event monitor  You have been referred to [facility] for home physical therapy.   Your physician recommends that you schedule a follow-up appointment in: 3 months with Dr Shirlee Latch.

## 2012-04-28 NOTE — Progress Notes (Signed)
Patient ID: Benjamin Kaiser, male   DOB: May 07, 1934, 78 y.o.   MRN: [id number] PCP: Dr. Jeannetta Nap  78 yo with history of morbid obesity, diastolic CHF, and CAD presents for cardiology followup.  Patient presented to [facility] with chest pain in 8/12 and was found to have NSTEMI.  Left heart cath showed a total occlusion of the proximal LAD with collaterals from the RCA.  There were serial 90% stenoses in the circumflex.  He was evaluated for CABG but was turned down due to obesity and poor functional status.  He had a drug-eluting stent placed in the CFX and the LAD was not intervened upon (chronic total occlusion).   Most recent echo in 3/13 showed EF 50-55% with mild AS.   He has been seen by Tereso Newcomer twice since I last saw him.  He had some mental status changes and had a head CT with parietal CVA of uncertain age.  Dilantin level was high as well and Dilantin was cut back.  He saw neurology and had a head MRI and what sounds like an EEG and nerve conduction studies but results are not available.    Patient is stable symptomatically.  He continues to be minimally active.  He can walk 60-75 feet around his house before becoming short of breath, which is chronic.  He uses a cane or walker due to poor balance.  He has fallen once since Thanksgiving.  No chest pain episodes recently.  No lightheadedness or syncope.  No tachypalpitations.  He continues to have chronic back pain.  Weight is down 4 lbs compared to prior appointment.   Labs (8/12): K 3.8, creatinine 1.05 Labs (10/12): BNP 64 Labs (11/12): K 3.4, creatinine 1.1, HDL 46, TGs 217, LDL 129 Labs (12/12): K 4.1, creatinine 1.0 Labs (1/13): LDL 132, HDL 48 Labs (3/13): LDL 57, HDL 54, K 4.4, creatinine 1.1, proBNP 36 Labs (7/13): LDL 53, HDL 51  PMH: 1. CVA 2. HTN 3. Right lower leg DVT in 2003 4. Obesity 5. CAD: NSTEMI (8/12).  LHC with total occlusion LAD and collaterals from RCA, 90% serial CFX stenoses.  Patient was evaluated for CABG  but was turned down by CVTS given obesity and poor functional status.  He had DES to CFX placed instead.   6. Diastolic CHF: Echo (8/12) with EF 45-50%, anteroseptal hypokinesis, mild LV hypertrophy, normal RV, aortic sclerosis.  Echo (3/13) with EF 50-55%, mild AS.  7. Hyperlipidemia 8. Gout 9. Seizure disorder 10. Bladder calculi 11. BPH 12. Aortic stenosis: Mild on 3/13 echo.  13. GERD 14. CVA: Noted on CT in 1/14 (parietal CVA).  Holter in 1/14 showed no significant arrhythmia.   SH: Lives in [place], married.  1 living son.  Prior smoker.   FH: 2 sons died of MIs in their 47s.  Another son committed suicide.    ROS: All systems reviewed and negative except as per HPI.    Current Outpatient Prescriptions  Medication Sig Dispense Refill  . allopurinol (ZYLOPRIM) 300 MG tablet Take 300 mg by mouth daily.        Marland Kitchen aspirin 81 MG tablet Take 81 mg by mouth daily.        Marland Kitchen atorvastatin (LIPITOR) 80 MG tablet Take 1 tablet (80 mg total) by mouth daily.  90 tablet  3  . clopidogrel (PLAVIX) 75 MG tablet Take 1 tablet (75 mg total) by mouth daily.  90 tablet  3  . ezetimibe (ZETIA) 10 MG tablet Take  1 tablet (10 mg total) by mouth daily.  90 tablet  3  . furosemide (LASIX) 40 MG tablet Take 1 tablet (40 mg total) by mouth daily.  90 tablet  3  . ibuprofen (ADVIL,MOTRIN) 200 MG tablet Take 200 mg by mouth every 6 (six) hours as needed.      . isosorbide mononitrate (IMDUR) 30 MG 24 hr tablet Take 1 tablet (30 mg total) by mouth daily.  90 tablet  3  . lisinopril (PRINIVIL,ZESTRIL) 5 MG tablet Take 1 tablet (5 mg total) by mouth daily.  90 tablet  3  . metoprolol succinate (TOPROL-XL) 50 MG 24 hr tablet Take 25 mg by mouth daily. Take with or immediately following a meal.      . Multiple Vitamins-Minerals (EYE VITAMINS) CAPS Take 1 capsule by mouth daily.        . niacin (NIASPAN) 1000 MG CR tablet Take 1 tablet (1,000 mg total) by mouth at bedtime.  90 tablet  3  . nitroGLYCERIN (NITROSTAT)  0.4 MG SL tablet Place 1 tablet (0.4 mg total) under the tongue every 5 (five) minutes as needed.  100 tablet  3  . phenytoin (DILANTIN) 100 MG ER capsule Taking 2 Tablets in A.M and 1 in P.M      . potassium chloride SA (K-DUR,KLOR-CON) 20 MEQ tablet Take 1 tablet (20 mEq total) by mouth 2 (two) times daily.  180 tablet  3  . Tamsulosin HCl (FLOMAX) 0.4 MG CAPS Take 0.4 mg by mouth daily.       No current facility-administered medications for this visit.    BP 100/70  Pulse 68  Wt 328 lb (148.78 kg)  BMI 42.09 kg/m2 General: Obese, NAD Neck: Thick neck, no JVD, no thyromegaly or thyroid nodule.  Lungs: Clear to auscultation bilaterally with normal respiratory effort. CV: Nondisplaced PMI.  Heart regular S1/S2, no S3/S4, 1/6 SEM RUSB.  No edema.  No carotid bruit.  Normal pedal pulses.  Abdomen: Soft, nontender, no hepatosplenomegaly, no distention.   Neurologic: Alert and oriented x 3.  Psych: Normal affect. Extremities: No clubbing or cyanosis.   Assessment/Plan: CAD (coronary artery disease)  S/p PCI to CFX. Also has chronic total occlusion of LAD with collaterals from the RCA. Patient was seen by CVTS and deemed a poor CABG candidate due to obesity and poor functional status. No recent chest pain.  EF low normal on last echo. - Continue ASA 81, Plavix, Imdur, Toprol XL, ACEI, and statin.  Diastolic CHF, chronic Patient has exertional dyspnea with a component of diastolic CHF. However, I think that the main factor is obesity and deconditioning. His functional status is poor. He is not very active at all.  Poor balance is a part of his problem as well.  NYHA class III symptoms are stable.  He does not appear significantly volume overloaded at this time.  - Continue current Lasix dosing but will check BMET and BNP.  - I am going to try to get home PT for him as he needs to get more active.  Morbid obesity  Weight loss is imperative. He has lost 4 lbs since last appointment.  As above,  I will try to arrange home PT.  Hyperlipidemia Check lipids today.  He wants to stop Niacin due to flushing.  I will see what his lipids look like but probably would be ok to stop but continue statin.  CVA Recent CVA noted on head CT.  He has had a workup with neurology  but does not yet know the result of testing.  He is on ASA and Plavix.  He is at risk for atrial fibrillation.  I am going to have him wear an event monitor for 30 days to screen for atrial fibrillation (in which case we would need to consider anticoagulation).  H/o Seizures Dilantin level was elevated recently.  He has felt better since decreasing Dilantin dose.  Will recheck level today.   Marca Ancona 04/28/2012

## 2012-04-30 LAB — BRAIN NATRIURETIC PEPTIDE: Pro B Natriuretic peptide (BNP): 39 pg/mL (ref 0.0–100.0)

## 2012-05-01 ENCOUNTER — Ambulatory Visit (INDEPENDENT_AMBULATORY_CARE_PROVIDER_SITE_OTHER): Payer: Medicare Other | Admitting: *Deleted

## 2012-05-01 DIAGNOSIS — I6350 Cerebral infarction due to unspecified occlusion or stenosis of unspecified cerebral artery: Secondary | ICD-10-CM

## 2012-05-05 LAB — PHENYTOIN LEVEL, TOTAL: Phenytoin Lvl: 5.7 ug/mL — ABNORMAL LOW (ref 10.0–20.0)

## 2012-05-09 ENCOUNTER — Ambulatory Visit (INDEPENDENT_AMBULATORY_CARE_PROVIDER_SITE_OTHER): Payer: Medicare Other

## 2012-05-09 DIAGNOSIS — I5032 Chronic diastolic (congestive) heart failure: Secondary | ICD-10-CM

## 2012-05-09 DIAGNOSIS — I25810 Atherosclerosis of coronary artery bypass graft(s) without angina pectoris: Secondary | ICD-10-CM

## 2012-05-09 DIAGNOSIS — R0602 Shortness of breath: Secondary | ICD-10-CM

## 2012-05-09 DIAGNOSIS — I4891 Unspecified atrial fibrillation: Secondary | ICD-10-CM

## 2012-05-09 NOTE — Progress Notes (Signed)
Placed patient on a 30 day event monitor and went over instruction on how to use it and when to return it

## 2012-05-23 ENCOUNTER — Ambulatory Visit (INDEPENDENT_AMBULATORY_CARE_PROVIDER_SITE_OTHER): Payer: Self-pay | Admitting: Nurse Practitioner

## 2012-05-23 ENCOUNTER — Encounter: Payer: Self-pay | Admitting: Nurse Practitioner

## 2012-05-23 VITALS — BP 106/65 | HR 65 | Wt 326.0 lb

## 2012-05-23 DIAGNOSIS — G609 Hereditary and idiopathic neuropathy, unspecified: Secondary | ICD-10-CM

## 2012-05-23 DIAGNOSIS — I639 Cerebral infarction, unspecified: Secondary | ICD-10-CM

## 2012-05-23 DIAGNOSIS — I6350 Cerebral infarction due to unspecified occlusion or stenosis of unspecified cerebral artery: Secondary | ICD-10-CM

## 2012-05-23 MED ORDER — GABAPENTIN 300 MG PO CAPS: 300.0000 mg | ORAL_CAPSULE | Freq: Every day | ORAL | Status: DC

## 2012-05-23 NOTE — Progress Notes (Signed)
HPI: [name] returns for followup after last visit with Dr. Pearlean Brownie 03/20/2012. He has had generalized weakness for several years. Long-standing use of Dilantin since the 90s following a solitary seizure. Last level of Dilantin on 05/01/12  was 5.7, Dr. Pearlean Brownie had decreased dose at his last visit in hopes of getting him off of the medication altogether as it can also cause gait disorders. He complains of a burning pain in his feet more troublesome at night. He does have a history of alcohol abuse. He denies any falls since last seen, seated in a wheelchair today and ambulates with a quad cane. He is short of breath with exertion greater than 50 feet, history of chronic back pain as well.  ROS:   fever/chills, swelling in,legs, hearing loss, SOB, cough, wheezing, easy bruising, flushing, allergies, runny nose, weakness  Physical Exam General: well developed, well nourished, seated, in no evident distress Head: head normocephalic and atraumatic. Oropharynx benign Neck: supple with no carotid or supraclavicular bruits Cardiovascular: regular rate and rhythm, no murmurs  Neurologic Exam Mental Status: Awake and fully alert. Oriented to place and time. Recent and remote memory intact. Speech and language normal.   Cranial Nerves: Fundoscopic exam reveals sharp disc margins. Pupils equal, briskly reactive to light. Extraocular movements full without nystagmus. Visual fields full to confrontation. Hearing decreased bil. Facial sensation intact. Face, tongue, palate move normally and symmetrically. Neck flexion and extension normal.  Motor: Normal bulk and tone. Normal strength in all tested extremity muscles. No focal weakness, intermittent mild tremor of the right hand with action. Sensory.: Diminished pinprick to mid shin, diminished vibratory to ankles. Touch sensation is normal.  Coordination: Rapid alternating movements normal in all extremities. Finger-to-nose normal.  Gait and Station: Arises from  chair with difficulty. Stance is wide based. Unable to heel, toe or  tandem walk. Ambulates with a quad cane. Romberg positive  Reflexes: 2+ and symmetric except absent ankle jerks bilaterally. Toes downgoing.     ASSESSMENT: Severe peripheral neuropathy, patient does have history of alcohol use.     Gait and balance difficulties  with remote history of stroke and seizures with long-term use of Dilantin. MRI of the brain 04/02/2012 shows chronic multiple infarcts with chronic occlusion of the right internal carotid artery, mild changes of chronic microvascular ischemia, nothing acute MRA of the neck shows chronic occlusion of the right internal carotid artery at its origin, the left internal carotid artery shows no significant stenosis MRA of the head shows chronic occlusion of the right carotid internal artery with partial recanalization of the right middle cerebral artery from the left. The right posterior cerebral artery is occluded in its midsection. EEG is abnormal suggestive of moderate bihemispheric slowing which is a nonspecific finding.   PLAN: Patient given information on peripheral neuropathy Nerve conduction study shows severe peripheral neuropathy Will try gabapentin 300 at at bedtime only Will taper and discontinue Dilantin by one capsule every 3 weeks. Call for any seizure activity.  Patient to get B12 level today. TSH in January 2014 WNL. Serum protein electrophoresis normal.     Followup visit in 3 months Above findings and reports reviewed with Dr. Pearlean Brownie.    Nilda Riggs, GNP-BC APRN

## 2012-05-23 NOTE — Patient Instructions (Addendum)
Patient given information on peripheral neuropathy Nerve conduction study shows severe peripheral neuropathy Will try gabapentin 300 at at bedtime only Will taper and discontinue Dilantin by one capsule every 3 weeks Patient to get B12 level today Followup visit in 3 months

## 2012-05-27 ENCOUNTER — Telehealth: Payer: Self-pay

## 2012-05-27 MED ORDER — GABAPENTIN 300 MG PO CAPS: 300.0000 mg | ORAL_CAPSULE | Freq: Every day | ORAL | Status: DC

## 2012-05-27 NOTE — Telephone Encounter (Signed)
Spouse called saying pharmacy says they never received the rx for Gabapentin that we sent on 05/23/12.  I will resend rx.

## 2012-05-29 NOTE — Progress Notes (Signed)
Quick Note:  Normal B12 level ______

## 2012-06-24 ENCOUNTER — Telehealth: Payer: Self-pay | Admitting: *Deleted

## 2012-06-24 NOTE — Telephone Encounter (Signed)
Dr Shirlee Latch reviewed monitor done 05/09/12. NSR only. No atrial fib noted. Pt's wife aware.

## 2012-07-28 ENCOUNTER — Telehealth: Payer: Self-pay | Admitting: Neurology

## 2012-07-28 MED ORDER — GABAPENTIN 300 MG PO CAPS: 300.0000 mg | ORAL_CAPSULE | Freq: Every day | ORAL | Status: DC

## 2012-07-28 NOTE — Telephone Encounter (Signed)
Unable to reach patient.  Sent Rx to local and mail order pharmacy.

## 2012-07-31 ENCOUNTER — Ambulatory Visit (INDEPENDENT_AMBULATORY_CARE_PROVIDER_SITE_OTHER): Payer: Medicare Other | Admitting: Cardiology

## 2012-07-31 ENCOUNTER — Encounter: Payer: Self-pay | Admitting: Cardiology

## 2012-07-31 VITALS — BP 98/60 | HR 62 | Ht 74.0 in | Wt 328.4 lb

## 2012-07-31 DIAGNOSIS — I6529 Occlusion and stenosis of unspecified carotid artery: Secondary | ICD-10-CM

## 2012-07-31 DIAGNOSIS — I10 Essential (primary) hypertension: Secondary | ICD-10-CM

## 2012-07-31 DIAGNOSIS — I6350 Cerebral infarction due to unspecified occlusion or stenosis of unspecified cerebral artery: Secondary | ICD-10-CM

## 2012-07-31 DIAGNOSIS — I5032 Chronic diastolic (congestive) heart failure: Secondary | ICD-10-CM

## 2012-07-31 DIAGNOSIS — E785 Hyperlipidemia, unspecified: Secondary | ICD-10-CM

## 2012-07-31 DIAGNOSIS — I2510 Atherosclerotic heart disease of native coronary artery without angina pectoris: Secondary | ICD-10-CM

## 2012-07-31 DIAGNOSIS — I509 Heart failure, unspecified: Secondary | ICD-10-CM

## 2012-07-31 DIAGNOSIS — I639 Cerebral infarction, unspecified: Secondary | ICD-10-CM

## 2012-07-31 MED ORDER — LISINOPRIL 2.5 MG PO TABS: 2.5000 mg | ORAL_TABLET | Freq: Every day | ORAL | Status: DC

## 2012-07-31 NOTE — Patient Instructions (Addendum)
Decrease lisinopril to 2.5mg  daily. You can take 1/2 of your 5mg  tablet daily and use your current supply.  You can stop Niaspan(niacin).  Your physician wants you to follow-up in: 6 months with Dr Shirlee Latch. (December 2014). You will receive a reminder letter in the mail two months in advance. If you don't receive a letter, please call our office to schedule the follow-up appointment.   Your physician has requested that you have a carotid duplex. This test is an ultrasound of the carotid arteries in your neck. It looks at blood flow through these arteries that supply the brain with blood. Allow one hour for this exam. There are no restrictions or special instructions. February 2015

## 2012-08-01 NOTE — Progress Notes (Signed)
Patient ID: Benjamin Kaiser, male   DOB: 11-04-1934, 78 y.o.   MRN: [id number] PCP: Dr. Jeannetta Nap  78 yo with history of morbid obesity, diastolic CHF, and CAD presents for cardiology followup.  Patient presented to [facility] with chest pain in 8/12 and was found to have NSTEMI.  Left heart cath showed a total occlusion of the proximal LAD with collaterals from the RCA.  There were serial 90% stenoses in the circumflex.  He was evaluated for CABG but was turned down due to obesity and poor functional status.  He had a drug-eluting stent placed in the CFX and the LAD was not intervened upon (chronic total occlusion).   Most recent echo in 3/13 showed EF 50-55% with mild AS.   MRI of the head in 2/14 showed multiple chronic infarcts.  I had him wear a 30 day monitor in 3/14, this did not show any atrial fibrillation.  However, CTA neck showed chronic occlusion of the RICA, which may be the etiology of the embolic CVAs.    Patient is stable symptomatically.  He continues to be minimally active.  He can walk 60-75 feet around his house before becoming short of breath, which is chronic.  He uses a cane or walker due to poor balance.  No chest pain episodes recently.  He does have some lightheadedness with standing and BP is low today (98/60).  No tachypalpitations.  He continues to have chronic back pain.  Weight is stable compared to prior appointment.  I had him do home PT.  His wife says he did well with PT but has quit doing the exercises since PT was completed.   Labs (8/12): K 3.8, creatinine 1.05 Labs (10/12): BNP 64 Labs (11/12): K 3.4, creatinine 1.1, HDL 46, TGs 217, LDL 129 Labs (12/12): K 4.1, creatinine 1.0 Labs (1/13): LDL 132, HDL 48 Labs (3/13): LDL 57, HDL 54, K 4.4, creatinine 1.1, proBNP 36 Labs (7/13): LDL 53, HDL 51 Labs (3/14): LDL 73, HDL 48, K 4.3, creatinine 1.2, BNP 39  PMH: 1. CVA: MRI 2/14 with multiple chronic infarcts.  CTA neck showed occluded RICA, LICA without significant  disease.  30 day event monitor in 3/14 did not show atrial fibrillation.  2. HTN 3. Right lower leg DVT in 2003 4. Obesity 5. CAD: NSTEMI (8/12).  LHC with total occlusion LAD and collaterals from RCA, 90% serial CFX stenoses.  Patient was evaluated for CABG but was turned down by CVTS given obesity and poor functional status.  He had DES to CFX placed instead.   6. Diastolic CHF: Echo (8/12) with EF 45-50%, anteroseptal hypokinesis, mild LV hypertrophy, normal RV, aortic sclerosis.  Echo (3/13) with EF 50-55%, mild AS.  7. Hyperlipidemia 8. Gout 9. Seizure disorder 10. Bladder calculi 11. BPH 12. Aortic stenosis: Mild on 3/13 echo.  13. GERD 14. 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   SH: Lives in [place], married.  1 living son.  Prior smoker.   FH: 2 sons died of MIs in their 7s.  Another son committed suicide.    ROS: All systems reviewed and negative except as per HPI.    Current Outpatient Prescriptions  Medication Sig Dispense Refill  . allopurinol (ZYLOPRIM) 300 MG tablet Take 300 mg by mouth daily.        Marland Kitchen atorvastatin (LIPITOR) 80 MG tablet Take 1 tablet (80 mg total) by mouth daily.  90 tablet  3  . clopidogrel (PLAVIX) 75 MG tablet Take 1 tablet (75 mg  total) by mouth daily.  90 tablet  3  . ezetimibe (ZETIA) 10 MG tablet Take 1 tablet (10 mg total) by mouth daily.  90 tablet  3  . furosemide (LASIX) 40 MG tablet Take 1 tablet (40 mg total) by mouth daily.  90 tablet  3  . gabapentin (NEURONTIN) 300 MG capsule Take 1 capsule (300 mg total) by mouth at bedtime.  30 capsule  2  . ibuprofen (ADVIL,MOTRIN) 200 MG tablet Take 200 mg by mouth every 6 (six) hours as needed.      . isosorbide mononitrate (IMDUR) 30 MG 24 hr tablet Take 1 tablet (30 mg total) by mouth daily.  90 tablet  3  . metoprolol succinate (TOPROL-XL) 50 MG 24 hr tablet Take 25 mg by mouth daily. Take with or immediately following a meal.      . Multiple Vitamins-Minerals (EYE VITAMINS) CAPS Take 1  capsule by mouth daily.        . nitroGLYCERIN (NITROSTAT) 0.4 MG SL tablet Place 1 tablet (0.4 mg total) under the tongue every 5 (five) minutes as needed.  100 tablet  3  . potassium chloride SA (K-DUR,KLOR-CON) 20 MEQ tablet Take 1 tablet (20 mEq total) by mouth 2 (two) times daily.  180 tablet  3  . Tamsulosin HCl (FLOMAX) 0.4 MG CAPS Take 0.4 mg by mouth daily.      Marland Kitchen aspirin 81 MG tablet Take 81 mg by mouth daily.        Marland Kitchen lisinopril (PRINIVIL,ZESTRIL) 2.5 MG tablet Take 1 tablet (2.5 mg total) by mouth daily.  90 tablet  3   No current facility-administered medications for this visit.    BP 98/60  Pulse 62  Ht 6\' 2"  (1.88 m)  Wt 328 lb 6.4 oz (148.961 kg)  BMI 42.15 kg/m2  SpO2 95% General: Obese, NAD Neck: Thick neck, no JVD, no thyromegaly or thyroid nodule.  Lungs: Clear to auscultation bilaterally with normal respiratory effort. CV: Nondisplaced PMI.  Heart regular S1/S2, no S3/S4, 1/6 SEM RUSB.  No edema.  Soft right carotid bruit.  Normal pedal pulses.  Abdomen: Soft, nontender, no hepatosplenomegaly, no distention.   Neurologic: Alert and oriented x 3.  Psych: Normal affect. Extremities: No clubbing or cyanosis.   Assessment/Plan: CAD  S/p PCI to CFX. Also has chronic total occlusion of LAD with collaterals from the RCA. Patient was seen by CVTS and deemed a poor CABG candidate due to obesity and poor functional status. No recent chest pain.  EF low normal on last echo. - Continue ASA 81, Plavix, Imdur, Toprol XL, ACEI, and statin.  Diastolic CHF, chronic Patient has exertional dyspnea with a component of diastolic CHF. However, I think that the main factor is obesity and deconditioning. His functional status is poor. He is not very active at all.  Poor balance is a part of his problem as well.  NYHA class III symptoms are stable.  He does not appear significantly volume overloaded at this time.  - Continue current Lasix dosing.   Morbid obesity  Weight loss is  imperative. He did well apparently with PT but has stopped doing the exercises.  I encouraged him to get more active.   Hyperlipidemia Good lipids in 3/14.  He wants to stop Niacin due to flushing.  I think this would be ok. CVA Suspect CVA noted on MRI in 2/14 was due to carotid disease.  He has an occluded RICA.  Minimal LICA disease.  Will get carotid  dopplers in 2/15 to follow LICA.  He is on ASA and Plavix.  No sign of atrial fibrillation on 30 day monitor in 3/14.  Orthostatic symptoms BP is on the low side today and he has some lightheadedness with standing.  I will decrease lisinopril to 2.5 mg daily.   Marca Ancona 08/01/2012

## 2012-10-06 ENCOUNTER — Ambulatory Visit (INDEPENDENT_AMBULATORY_CARE_PROVIDER_SITE_OTHER): Payer: Medicare Other | Admitting: Nurse Practitioner

## 2012-10-06 ENCOUNTER — Encounter: Payer: Self-pay | Admitting: Nurse Practitioner

## 2012-10-06 VITALS — BP 108/66 | HR 60 | Ht 73.5 in | Wt 344.0 lb

## 2012-10-06 DIAGNOSIS — G609 Hereditary and idiopathic neuropathy, unspecified: Secondary | ICD-10-CM

## 2012-10-06 DIAGNOSIS — I639 Cerebral infarction, unspecified: Secondary | ICD-10-CM

## 2012-10-06 DIAGNOSIS — I6350 Cerebral infarction due to unspecified occlusion or stenosis of unspecified cerebral artery: Secondary | ICD-10-CM

## 2012-10-06 MED ORDER — GABAPENTIN 300 MG PO CAPS: 300.0000 mg | ORAL_CAPSULE | Freq: Three times a day (TID) | ORAL | Status: DC

## 2012-10-06 NOTE — Patient Instructions (Addendum)
Off-Dilantin  and no further seizure activity Continue Plavix and aspirin for secondary stroke prevention Increase gabapentin to 1 in the Am and 1 at bedtime for 4 days then one in the a.m. one midafternoon, 1 at bedtime Followup in 6 months

## 2012-10-06 NOTE — Progress Notes (Signed)
Reason for visit  followup for severe peripheral neuropathy HPI: [name], 78 year old male returns for followup. He was last seen in this office 05/23/2012. He has had generalized weakness for several years. Long-standing use of Dilantin since the 90s following a solitary seizure. Last level of Dilantin on 05/01/12 was 5.7, Dr. Pearlean Brownie had decreased dose at his last visit in hopes of getting him off of the medication altogether as it can also cause gait disorders. He complains of a burning pain in his feet more troublesome at night. He does have a history of alcohol abuse. He denies any falls since last seen, seated in a wheelchair today and ambulates with a quad cane. He is short of breath with exertion greater than 25 feet, history of chronic back pain as well. EMG nerve conduction shows severe peripheral neuropathy. He was placed on gabapentin at his last visit which has helped his symptoms. He successfully tapered his Dilantin and has not had further seizure events. He had normal B12 level. Serum electrophoresis was normal   ROS: Hearing loss, ringing in the ears, constipation, joint pain, snoring, and restless legs, burning pain in the feet which has improved on gabapentin    Medications Current Outpatient Prescriptions on File Prior to Visit  Medication Sig Dispense Refill  . allopurinol (ZYLOPRIM) 300 MG tablet Take 300 mg by mouth daily.        Marland Kitchen aspirin 81 MG tablet Take 81 mg by mouth daily.        Marland Kitchen atorvastatin (LIPITOR) 80 MG tablet Take 1 tablet (80 mg total) by mouth daily.  90 tablet  3  . clopidogrel (PLAVIX) 75 MG tablet Take 1 tablet (75 mg total) by mouth daily.  90 tablet  3  . ezetimibe (ZETIA) 10 MG tablet Take 1 tablet (10 mg total) by mouth daily.  90 tablet  3  . furosemide (LASIX) 40 MG tablet Take 1 tablet (40 mg total) by mouth daily.  90 tablet  3  . gabapentin (NEURONTIN) 300 MG capsule Take 1 capsule (300 mg total) by mouth at bedtime.  30 capsule  2  . ibuprofen  (ADVIL,MOTRIN) 200 MG tablet Take 200 mg by mouth every 6 (six) hours as needed.      . isosorbide mononitrate (IMDUR) 30 MG 24 hr tablet Take 1 tablet (30 mg total) by mouth daily.  90 tablet  3  . lisinopril (PRINIVIL,ZESTRIL) 2.5 MG tablet Take 1 tablet (2.5 mg total) by mouth daily.  90 tablet  3  . metoprolol succinate (TOPROL-XL) 50 MG 24 hr tablet Take 25 mg by mouth daily. Take with or immediately following a meal.      . Multiple Vitamins-Minerals (EYE VITAMINS) CAPS Take 1 capsule by mouth daily.        . nitroGLYCERIN (NITROSTAT) 0.4 MG SL tablet Place 1 tablet (0.4 mg total) under the tongue every 5 (five) minutes as needed.  100 tablet  3  . potassium chloride SA (K-DUR,KLOR-CON) 20 MEQ tablet Take 1 tablet (20 mEq total) by mouth 2 (two) times daily.  180 tablet  3  . Tamsulosin HCl (FLOMAX) 0.4 MG CAPS Take 0.4 mg by mouth daily.       No current facility-administered medications on file prior to visit.    Allergies No Known Allergies  Physical Exam General: well developed, obese male  seated, in no evident distress Head: head normocephalic and atraumatic. Oropharynx benign Neck: supple with no carotid  bruits Cardiovascular: regular rate and rhythm,  no murmurs  Neurologic Exam Mental Status: Awake and fully alert. Oriented to place and time. Follows all commands. Speech and language normal.   Cranial Nerves: Pupils equal, briskly reactive to light. Extraocular movements full without nystagmus. Visual fields full to confrontation. Hearing decreased bilaterally.  Facial sensation intact. Face, tongue, palate move normally and symmetrically. Neck flexion and extension normal.  Motor: Normal bulk and tone. Normal strength in all tested extremity muscles.No focal weakness. Intermittent mild tremor of right  hand with action Sensory.: Diminished pinprick to mid shin bilaterally diminished vibratory to ankles. Touch sensation is normal   Coordination: Rapid alternating movements  normal in all extremities. Finger-to-nose normal. Gait and Station: Arises from chair without difficulty. Stance is wide based, ambulates with quad cane, unable to heel toe or tandem walk. Short of breath with minimal exertion  Reflexes: 2+ and symmetric except absent ankle jerks bilaterally. Toes downgoing.     ASSESSMENT: Severe peripheral neuropathy, patient does have history of alcohol use Gait imbalance difficulties with remote history of stroke and seizures with long-term use of Dilantin which has been discontinued without problem EEG abnormal suggestive of moderate bihemispheric slowing which is a nonspecific finding Nerve conduction study shows severe peripheral neuropathy Serum protein electrophoresis and B12 were normal.     PLAN: Continue Plavix and aspirin for secondary stroke prevention Increase gabapentin to 1 in the Am and 1 at bedtime for 4 days then one in the a.m. one midafternoon, 1 at bedtime Followup in 6 months  Nilda Riggs, GNP-BC APRN

## 2012-10-28 ENCOUNTER — Other Ambulatory Visit: Payer: Self-pay | Admitting: Neurology

## 2013-02-01 ENCOUNTER — Other Ambulatory Visit: Payer: Self-pay | Admitting: Cardiology

## 2013-03-10 ENCOUNTER — Encounter: Payer: Self-pay | Admitting: *Deleted

## 2013-03-10 ENCOUNTER — Encounter: Payer: Self-pay | Admitting: Cardiology

## 2013-03-10 ENCOUNTER — Ambulatory Visit (INDEPENDENT_AMBULATORY_CARE_PROVIDER_SITE_OTHER): Payer: Medicare Other | Admitting: Cardiology

## 2013-03-10 VITALS — BP 142/76 | HR 57 | Ht 73.5 in | Wt 369.0 lb

## 2013-03-10 DIAGNOSIS — I639 Cerebral infarction, unspecified: Secondary | ICD-10-CM

## 2013-03-10 DIAGNOSIS — I2510 Atherosclerotic heart disease of native coronary artery without angina pectoris: Secondary | ICD-10-CM

## 2013-03-10 DIAGNOSIS — I509 Heart failure, unspecified: Secondary | ICD-10-CM

## 2013-03-10 DIAGNOSIS — I6350 Cerebral infarction due to unspecified occlusion or stenosis of unspecified cerebral artery: Secondary | ICD-10-CM

## 2013-03-10 DIAGNOSIS — I5032 Chronic diastolic (congestive) heart failure: Secondary | ICD-10-CM

## 2013-03-10 DIAGNOSIS — E785 Hyperlipidemia, unspecified: Secondary | ICD-10-CM

## 2013-03-10 DIAGNOSIS — I10 Essential (primary) hypertension: Secondary | ICD-10-CM

## 2013-03-10 DIAGNOSIS — I6529 Occlusion and stenosis of unspecified carotid artery: Secondary | ICD-10-CM

## 2013-03-10 LAB — LIPID PANEL
CHOL/HDL RATIO: 3
Cholesterol: 114 mg/dL (ref 0–200)
HDL: 36.2 mg/dL — AB (ref >39.00)
LDL Cholesterol: 56 mg/dL (ref 0–99)
TRIGLYCERIDES: 111 mg/dL (ref 0.0–149.0)
VLDL: 22.2 mg/dL (ref 0.0–40.0)

## 2013-03-10 LAB — BASIC METABOLIC PANEL
BUN: 24 mg/dL — ABNORMAL HIGH (ref 6–23)
CALCIUM: 9.5 mg/dL (ref 8.4–10.5)
CO2: 31 meq/L (ref 19–32)
Chloride: 104 mEq/L (ref 96–112)
Creatinine, Ser: 1.5 mg/dL (ref 0.4–1.5)
GFR: 49.03 mL/min — AB (ref >60.00)
Glucose, Bld: 88 mg/dL (ref 70–99)
POTASSIUM: 4.6 meq/L (ref 3.5–5.1)
SODIUM: 143 meq/L (ref 135–145)

## 2013-03-10 MED ORDER — LISINOPRIL 2.5 MG PO TABS: 2.5000 mg | ORAL_TABLET | Freq: Every day | ORAL | Status: DC

## 2013-03-10 MED ORDER — EZETIMIBE 10 MG PO TABS: ORAL_TABLET | ORAL | Status: DC

## 2013-03-10 MED ORDER — CLOPIDOGREL BISULFATE 75 MG PO TABS: 75.0000 mg | ORAL_TABLET | Freq: Every day | ORAL | Status: DC

## 2013-03-10 MED ORDER — ATORVASTATIN CALCIUM 80 MG PO TABS: ORAL_TABLET | ORAL | Status: DC

## 2013-03-10 MED ORDER — FUROSEMIDE 40 MG PO TABS: ORAL_TABLET | ORAL | Status: DC

## 2013-03-10 MED ORDER — POTASSIUM CHLORIDE CRYS ER 20 MEQ PO TBCR: 20.0000 meq | EXTENDED_RELEASE_TABLET | Freq: Two times a day (BID) | ORAL | Status: DC

## 2013-03-10 MED ORDER — ISOSORBIDE MONONITRATE ER 30 MG PO TB24: ORAL_TABLET | ORAL | Status: DC

## 2013-03-10 NOTE — Patient Instructions (Signed)
Your physician recommends that you have lab work today--lipid profile/BMET.  Your physician has requested that you have a carotid duplex. This test is an ultrasound of the carotid arteries in your neck. It looks at blood flow through these arteries that supply the brain with blood. Allow one hour for this exam. There are no restrictions or special instructions. February 2015  Your physician wants you to follow-up in: 6 months with Dr Aundra Dubin. (July 2015). You will receive a reminder letter in the mail two months in advance. If you don't receive a letter, please call our office to schedule the follow-up appointment.

## 2013-03-11 NOTE — Progress Notes (Signed)
Patient ID: Benjamin Kaiser, male   DOB: 01-07-1935, 79 y.o.   MRN: [id number] PCP: Dr. Arelia Sneddon  79 yo with history of morbid obesity, diastolic CHF, and CAD presents for cardiology followup.  Patient presented to [facility] with chest pain in 8/12 and was found to have NSTEMI.  Left heart cath showed a total occlusion of the proximal LAD with collaterals from the RCA.  There were serial 90% stenoses in the circumflex.  He was evaluated for CABG but was turned down due to obesity and poor functional status.  He had a drug-eluting stent placed in the CFX and the LAD was not intervened upon (chronic total occlusion).   Most recent echo in 3/13 showed EF 50-55% with mild AS.   MRI of the head in 2/14 showed multiple chronic infarcts.  I had him wear a 30 day monitor in 3/14, this did not show any atrial fibrillation.  However, CTA neck showed chronic occlusion of the RICA, which may be the etiology of the embolic CVAs.    Patient is stable symptomatically.  He continues to be minimally active.  He has a severe peripheral neuropathy and is not very steady on his feet.  Per his wife, he is not getting up to do anything.  Very little motivation.  Eats whatever he wants.  Recently ate a pound cake.  He is up 41 lbs since last appointment.  No chest pain episodes recently.  No longer gets lightheaded with standing since BP meds cut back.  No tachypalpitations.  He continues to have chronic back pain.  He is using his wheelchair pretty much all the time.   ECG: NSR, 1st degree AV block  Labs (8/12): K 3.8, creatinine 1.05 Labs (10/12): BNP 64 Labs (11/12): K 3.4, creatinine 1.1, HDL 46, TGs 217, LDL 129 Labs (12/12): K 4.1, creatinine 1.0 Labs (1/13): LDL 132, HDL 48 Labs (3/13): LDL 57, HDL 54, K 4.4, creatinine 1.1, proBNP 36 Labs (7/13): LDL 53, HDL 51 Labs (3/14): LDL 73, HDL 48, K 4.3, creatinine 1.2, BNP 39  PMH: 1. CVA: MRI 2/14 with multiple chronic infarcts.  CTA neck showed occluded RICA, LICA  without significant disease.  30 day event monitor in 3/14 did not show atrial fibrillation.  2. HTN 3. Right lower leg DVT in 2003 4. Obesity 5. CAD: NSTEMI (8/12).  LHC with total occlusion LAD and collaterals from RCA, 90% serial CFX stenoses.  Patient was evaluated for CABG but was turned down by CVTS given obesity and poor functional status.  He had DES to CFX placed instead.   6. Diastolic CHF: Echo (0000000) with EF 45-50%, anteroseptal hypokinesis, mild LV hypertrophy, normal RV, aortic sclerosis.  Echo (3/13) with EF 50-55%, mild AS.  7. Hyperlipidemia 8. Gout 9. Seizure disorder 10. Bladder calculi 11. BPH 12. Aortic stenosis: Mild on 3/13 echo.  13. GERD 14. 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   SH: Lives in [place], married.  1 living son.  Prior smoker.   FH: 2 sons died of MIs in their 55s.  Another son committed suicide.    ROS: All systems reviewed and negative except as per HPI.    Current Outpatient Prescriptions  Medication Sig Dispense Refill  . aspirin 81 MG tablet Take 81 mg by mouth daily.        Marland Kitchen atorvastatin (LIPITOR) 80 MG tablet Take 1 tablet by mouth  daily  90 tablet  3  . clopidogrel (PLAVIX) 75 MG tablet Take 1 tablet (  75 mg total) by mouth daily.  90 tablet  3  . ezetimibe (ZETIA) 10 MG tablet Take 1 tablet by mouth  daily  90 tablet  3  . furosemide (LASIX) 40 MG tablet Take 1 tablet by mouth  daily.  90 tablet  3  . gabapentin (NEURONTIN) 300 MG capsule Take 1 capsule (300 mg total) by mouth 3 (three) times daily.  270 capsule  1  . ibuprofen (ADVIL,MOTRIN) 200 MG tablet Take 200 mg by mouth every 6 (six) hours as needed.      . isosorbide mononitrate (IMDUR) 30 MG 24 hr tablet Take 1 tablet by mouth  daily  90 tablet  3  . lisinopril (PRINIVIL,ZESTRIL) 2.5 MG tablet Take 1 tablet (2.5 mg total) by mouth daily.  90 tablet  3  . metoprolol succinate (TOPROL-XL) 50 MG 24 hr tablet Take 25 mg by mouth daily. Take with or immediately following a  meal.      . Multiple Vitamins-Minerals (EYE VITAMINS) CAPS Take 1 capsule by mouth daily.        . nitroGLYCERIN (NITROSTAT) 0.4 MG SL tablet Place 1 tablet (0.4 mg total) under the tongue every 5 (five) minutes as needed.  100 tablet  3  . potassium chloride SA (K-DUR,KLOR-CON) 20 MEQ tablet Take 1 tablet (20 mEq total) by mouth 2 (two) times daily.  180 tablet  3  . Tamsulosin HCl (FLOMAX) 0.4 MG CAPS Take 0.4 mg by mouth daily.       No current facility-administered medications for this visit.    BP 142/76  Pulse 57  Ht 6' 1.5" (1.867 m)  Wt 167.377 kg (369 lb)  BMI 48.02 kg/m2 General: Obese, NAD Neck: Thick neck, no JVD, no thyromegaly or thyroid nodule.  Lungs: Clear to auscultation bilaterally with normal respiratory effort. CV: Nondisplaced PMI.  Heart regular S1/S2, no S3/S4, 1/6 SEM RUSB.  No edema.  Soft right carotid bruit.  Normal pedal pulses.  Abdomen: Soft, nontender, no hepatosplenomegaly, no distention.   Neurologic: Alert and oriented x 3.  Psych: Normal affect. Extremities: No clubbing or cyanosis.   Assessment/Plan: CAD  S/p PCI to CFX. Also has chronic total occlusion of LAD with collaterals from the RCA. Patient was seen by CVTS and deemed a poor CABG candidate due to obesity and poor functional status. No recent chest pain.  EF low normal on last echo. - Continue ASA 81, Plavix, Imdur, Toprol XL, ACEI, and statin.  Diastolic CHF, chronic Patient has exertional dyspnea with a component of diastolic CHF. However, I think that the main factor is obesity and deconditioning. His functional status is poor. He is not very active at all.  Poor balance from peripheral neuropathy is a part of his problem as well.  NYHA class III symptoms are stable.  He does not appear significantly volume overloaded at this time.  - Continue current Lasix dosing and check BMET today.   Morbid obesity  Very inactive and eats what he wants.  He has gained another 40 lbs.  I had a very  long discussion with him today about working on his weight. However, he seems to lack much motivation to change his lifestyle.  I suspect that there is a major component of depression.  I would like him to followup with his primary care about this.  Hyperlipidemia Good lipids in 3/14.  Will check today.  Continue statin, Zetia.  CVA Suspect CVA noted on MRI in 2/14 was due to carotid disease.  He has an occluded RICA.  Minimal LICA disease.  Will get carotid dopplers in 5/99 to follow LICA.  He is on ASA and Plavix.  No sign of atrial fibrillation on 30 day monitor in 3/14.  Orthostatic symptoms Resolved with decrease in lisinopril.   Loralie Champagne 03/11/2013

## 2013-03-12 ENCOUNTER — Other Ambulatory Visit: Payer: Self-pay

## 2013-03-12 MED ORDER — METOPROLOL SUCCINATE ER 50 MG PO TB24: 25.0000 mg | ORAL_TABLET | Freq: Every day | ORAL | Status: DC

## 2013-03-29 IMAGING — CT CT HEAD W/O CM
2 series · 16 of 30 positions shown, 20 images · non-contrast
Comparison: Head CT scan 12/15/2008.

CLINICAL DATA: Weakness and tremors.

CT HEAD WITHOUT CONTRAST
TECHNIQUE: Contiguous axial images were obtained from the base of
the skull through the vertex without contrast.

[Series 2: head_seq -c 4.5 h37s st · axial · 0.44mm/px · z∈[-118,+39]mm · 13 of 40 slices shown, 17 images]
[im 3/40  brain]
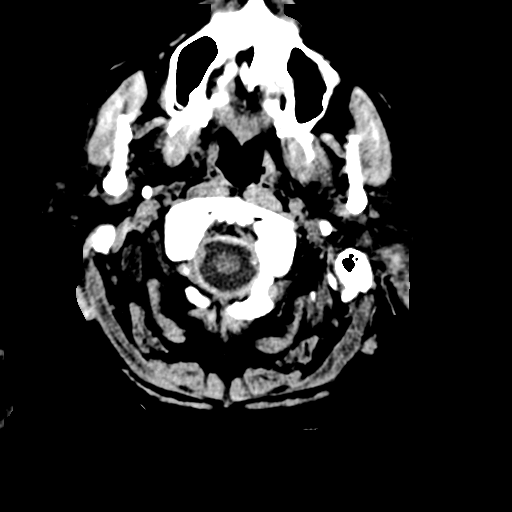
[im 3/40  bone]
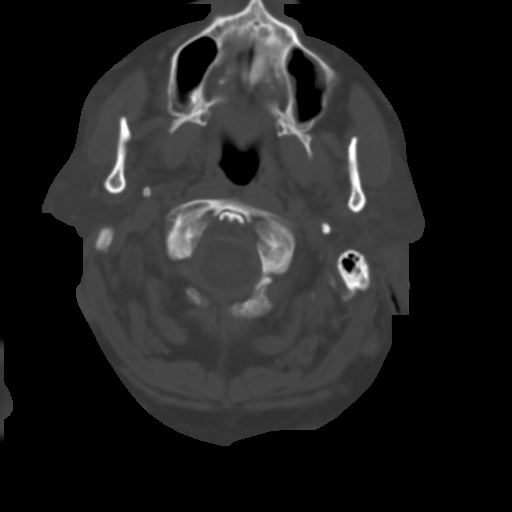
[im 6/40  brain]
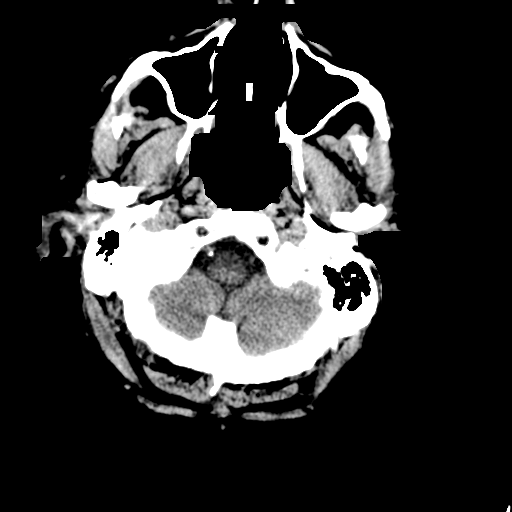
[im 9/40  brain]
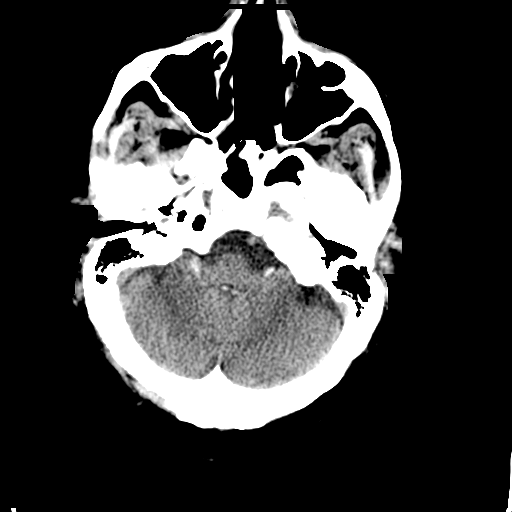
[im 12/40  brain]
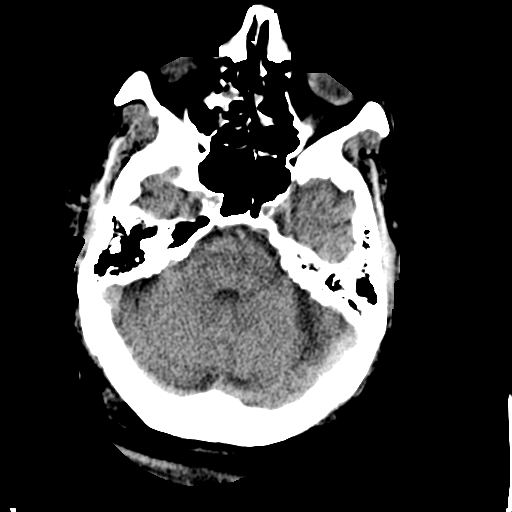
[im 14/40  brain]
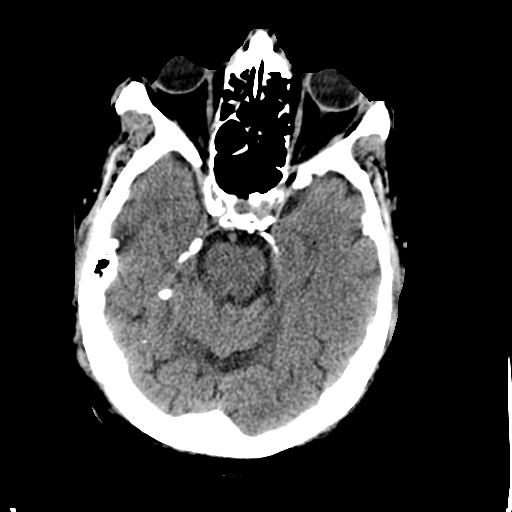
[im 14/40  bone]
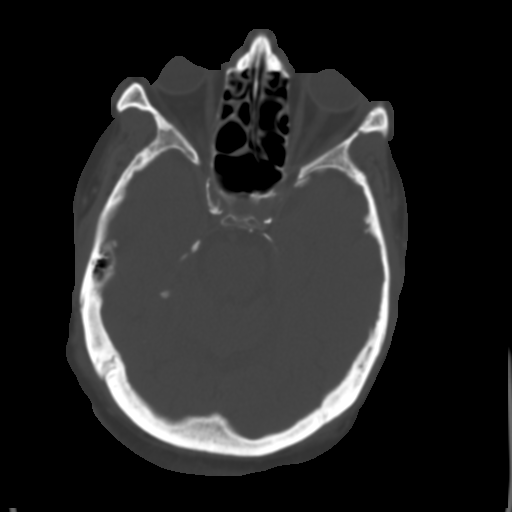
[im 17/40  brain]
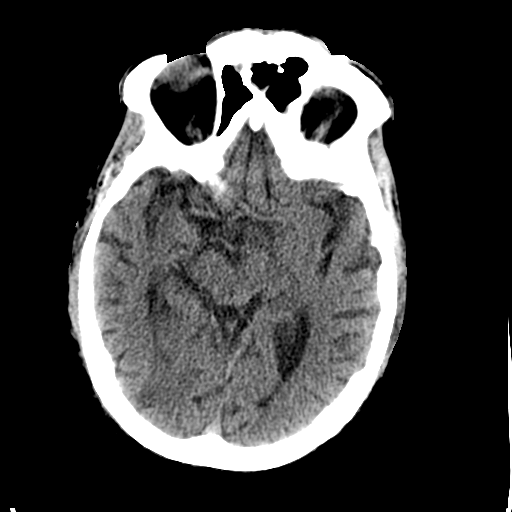
[im 20/40  brain]
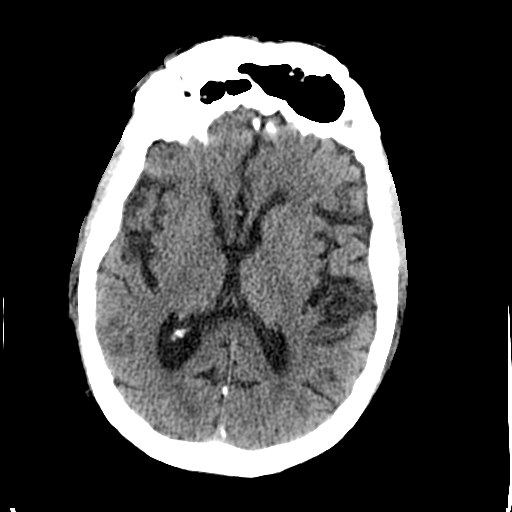
[im 23/40  brain]
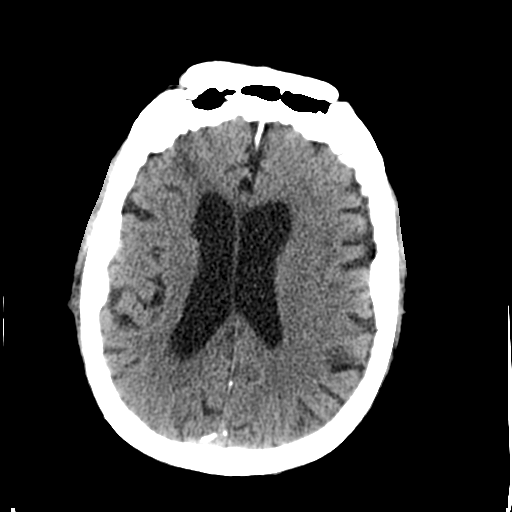
[im 26/40  brain]
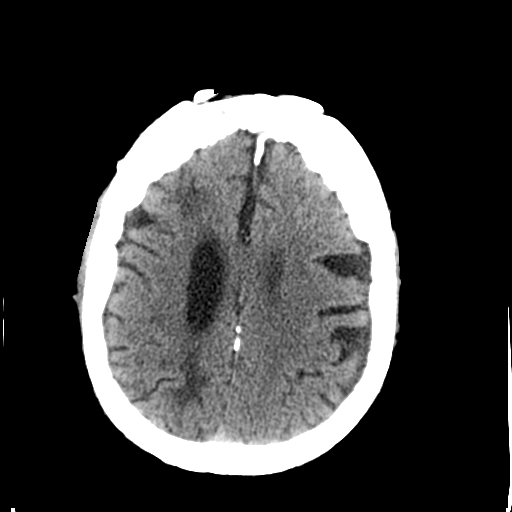
[im 26/40  bone]
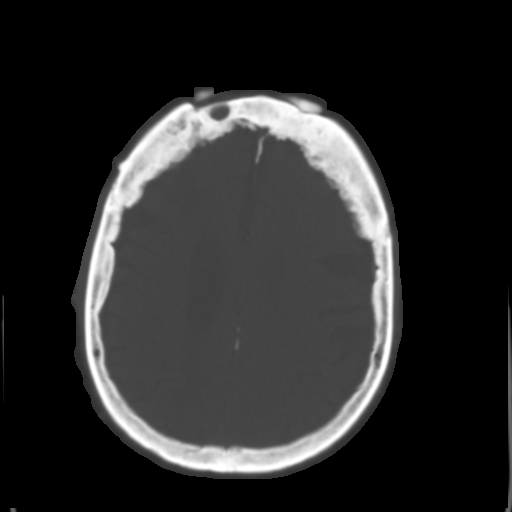
[im 28/40  brain]
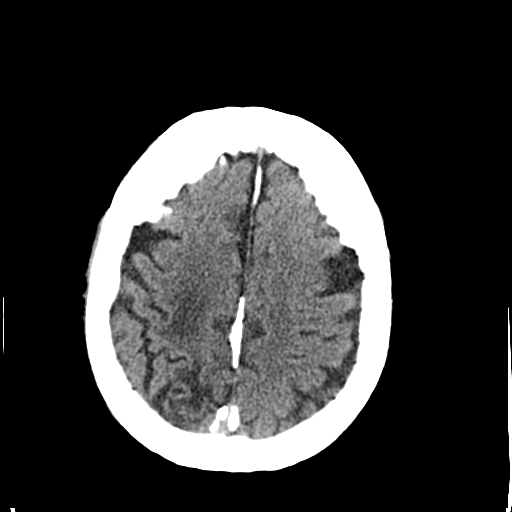
[im 31/40  brain]
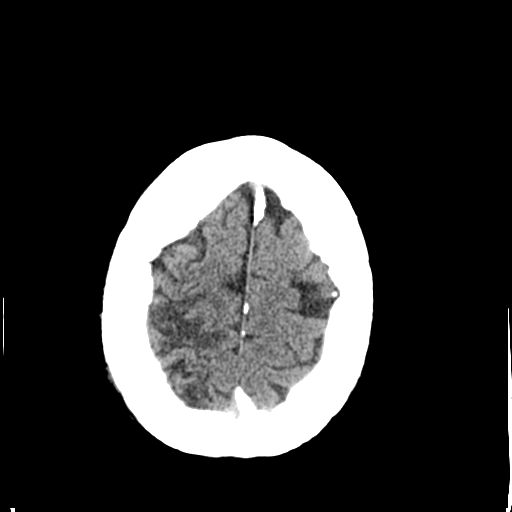
[im 34/40  brain]
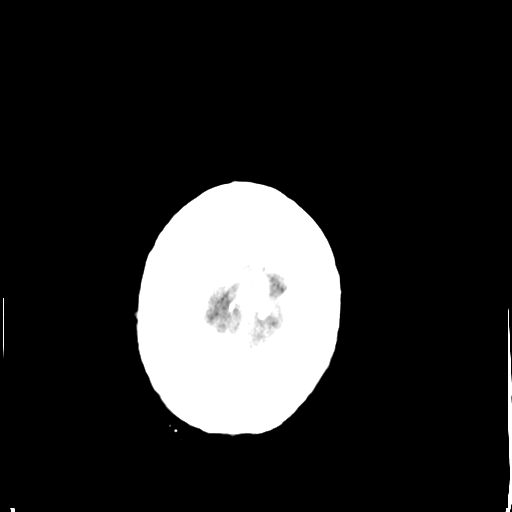
[im 37/40  brain]
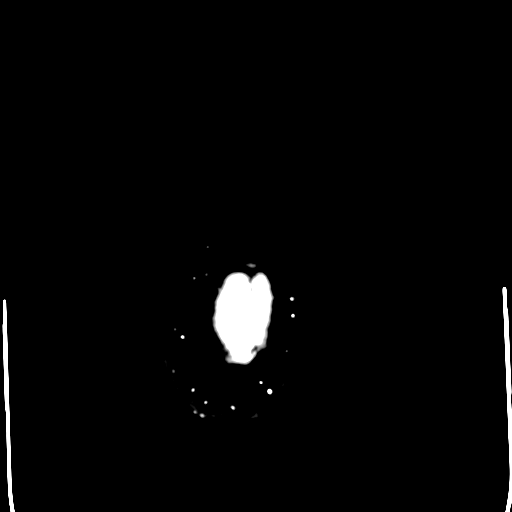
[im 37/40  bone]
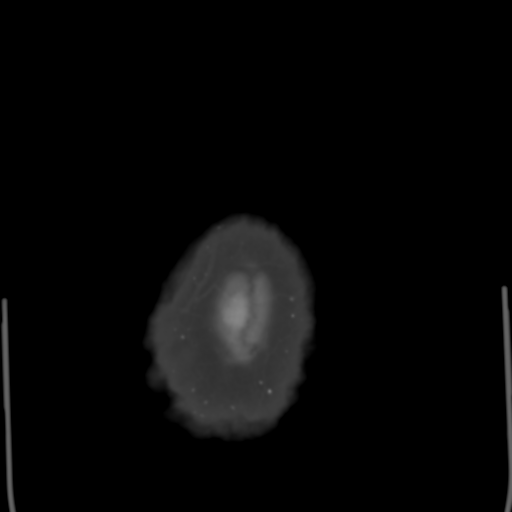

[Series 3: head_seq -c 4.5 h60s bone · axial · 0.44mm/px · z∈[-116,-65]mm · 3 of 40 slices shown]
[im 3/40  bone]
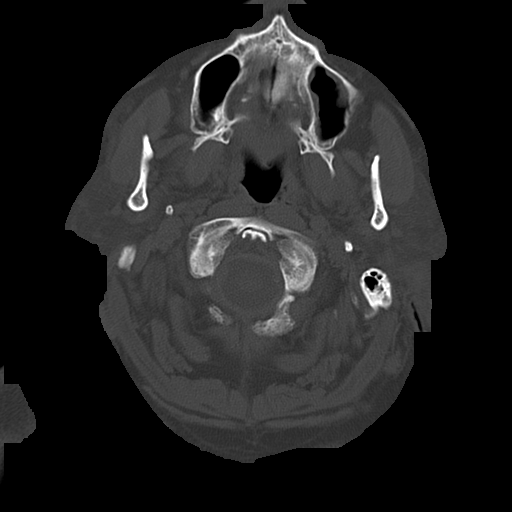
[im 9/40  bone]
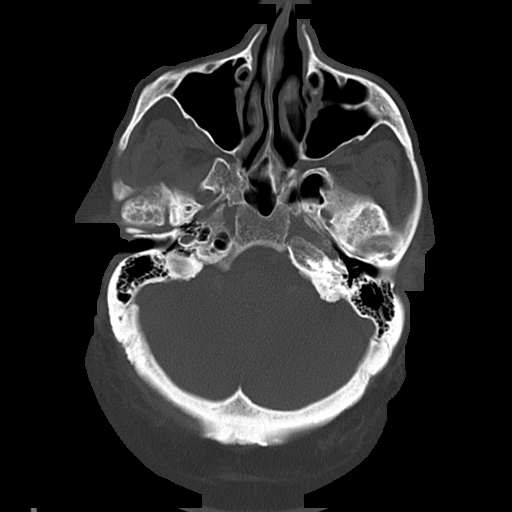
[im 14/40  bone]
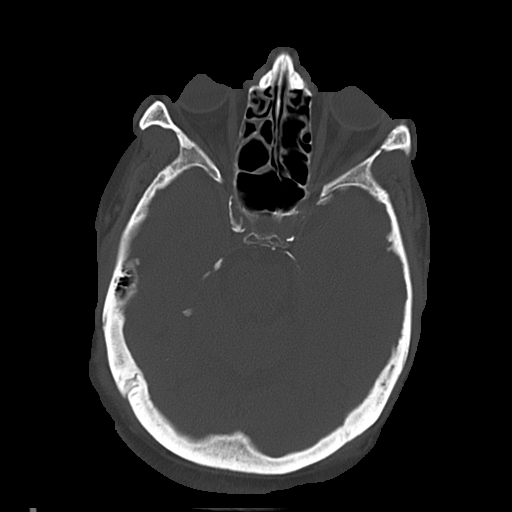

[16 of 30 positions shown; findings below may reference images not displayed]

FINDINGS: Since the prior study, the patient has suffered a high
right parietal infarct which appears remote.  The brain is atrophic
and there is chronic microvascular ischemic change.  No evidence of
acute abnormality including infarct, hemorrhage, mass lesion, mass
effect, midline shift or abnormal extra-axial fluid collection.
Postoperative change frontal sinuses again seen.
IMPRESSION: 1.  No acute finding.
2.  Remote right parietal infarct.  Atrophy and chronic
microvascular ischemic change.

## 2013-03-30 ENCOUNTER — Ambulatory Visit (HOSPITAL_COMMUNITY): Payer: Medicare Other | Attending: Cardiovascular Disease

## 2013-03-30 ENCOUNTER — Encounter: Payer: Self-pay | Admitting: Cardiovascular Disease

## 2013-03-30 DIAGNOSIS — I6529 Occlusion and stenosis of unspecified carotid artery: Secondary | ICD-10-CM

## 2013-03-30 DIAGNOSIS — E785 Hyperlipidemia, unspecified: Secondary | ICD-10-CM | POA: Insufficient documentation

## 2013-03-30 DIAGNOSIS — R42 Dizziness and giddiness: Secondary | ICD-10-CM

## 2013-03-30 DIAGNOSIS — I2510 Atherosclerotic heart disease of native coronary artery without angina pectoris: Secondary | ICD-10-CM | POA: Insufficient documentation

## 2013-03-30 DIAGNOSIS — I658 Occlusion and stenosis of other precerebral arteries: Secondary | ICD-10-CM | POA: Insufficient documentation

## 2013-03-30 DIAGNOSIS — I10 Essential (primary) hypertension: Secondary | ICD-10-CM | POA: Insufficient documentation

## 2013-03-30 DIAGNOSIS — Z8673 Personal history of transient ischemic attack (TIA), and cerebral infarction without residual deficits: Secondary | ICD-10-CM | POA: Insufficient documentation

## 2013-03-30 DIAGNOSIS — I5032 Chronic diastolic (congestive) heart failure: Secondary | ICD-10-CM

## 2013-04-08 ENCOUNTER — Ambulatory Visit (INDEPENDENT_AMBULATORY_CARE_PROVIDER_SITE_OTHER): Payer: Medicare Other | Admitting: Nurse Practitioner

## 2013-04-08 ENCOUNTER — Encounter: Payer: Self-pay | Admitting: Nurse Practitioner

## 2013-04-08 VITALS — BP 107/66 | HR 57 | Ht 74.0 in | Wt 360.0 lb

## 2013-04-08 DIAGNOSIS — I639 Cerebral infarction, unspecified: Secondary | ICD-10-CM

## 2013-04-08 DIAGNOSIS — G609 Hereditary and idiopathic neuropathy, unspecified: Secondary | ICD-10-CM

## 2013-04-08 DIAGNOSIS — I6350 Cerebral infarction due to unspecified occlusion or stenosis of unspecified cerebral artery: Secondary | ICD-10-CM

## 2013-04-08 MED ORDER — GABAPENTIN 300 MG PO CAPS: 300.0000 mg | ORAL_CAPSULE | Freq: Three times a day (TID) | ORAL | Status: DC

## 2013-04-08 NOTE — Progress Notes (Signed)
[facility]  PATIENT: Benjamin Kaiser DOB: 01-11-1935   REASON FOR VISIT: follow up peripheral neuropathy  HISTORY OF PRESENT ILLNESS: Benjamin Kaiser, 79 year old male returns for followup. He was last seen in this office 10/06/2012. He has a history of neuropathy and generalized weakness for several years. He has a long-standing history of Dilantin use for a single isolated seizure. This was titrated off as Dilantin can can cause a gait disorder. He is short of breath with exertion and his wife says he gets no exercise at all. He has gained about 24 pounds since last seen he is currently on gabapentin but only takes 300 twice daily. He is sleeping approximately 16 hours a day. He does not watch what he eats according to wife  and recently ate a whole pound cake. He does snore at night, but wife is not sure if he quits breathing  or not. He has not fallen recently, he uses a quad cane. He returns for reevaluation. He has not had further stroke TIA symptoms    HISTORY: He has had generalized weakness for several years. Long-standing use of Dilantin since the 90s following a solitary seizure. Last level of Dilantin on 05/01/12 was 5.7, Dr. [name] had decreased dose at his last visit in hopes of getting him off of the medication altogether as it can also cause gait disorders. He complains of a burning pain in his feet more troublesome at night. He does have a history of alcohol abuse. He denies any falls since last seen, seated in a wheelchair today and ambulates with a quad cane. He is short of breath with exertion greater than 25 feet, history of chronic back pain as well. EMG nerve conduction shows severe peripheral neuropathy. He was placed on gabapentin at his last visit which has helped his symptoms. He successfully tapered his Dilantin and has not had further seizure events. He had normal B12 level. Serum electrophoresis was normal   REVIEW OF SYSTEMS: Full 14 system review of systems  performed and notable only for those listed, all others are neg:  Constitutional: N/A  Cardiovascular: N/A  Ear/Nose/Throat: N/A  Skin: N/A  Eyes: N/A  Respiratory: Cough, shortness of breath Gastroitestinal: N/A  Hematology/Lymphatic: N/A  Endocrine: N/A Musculoskeletal: Walking difficulty Allergy/Immunology: N/A  Neurological: N/A Psychiatric: N/A Sleep snoring   ALLERGIES: No Known Allergies  HOME MEDICATIONS: Outpatient Prescriptions Prior to Visit  Medication Sig Dispense Refill  . aspirin 81 MG tablet Take 81 mg by mouth daily.        Marland Kitchen atorvastatin (LIPITOR) 80 MG tablet Take 1 tablet by mouth  daily  90 tablet  3  . clopidogrel (PLAVIX) 75 MG tablet Take 1 tablet (75 mg total) by mouth daily.  90 tablet  3  . ezetimibe (ZETIA) 10 MG tablet Take 1 tablet by mouth  daily  90 tablet  3  . furosemide (LASIX) 40 MG tablet Take 1 tablet by mouth  daily.  90 tablet  3  . ibuprofen (ADVIL,MOTRIN) 200 MG tablet Take 200 mg by mouth every 6 (six) hours as needed.      . isosorbide mononitrate (IMDUR) 30 MG 24 hr tablet Take 1 tablet by mouth  daily  90 tablet  3  . lisinopril (PRINIVIL,ZESTRIL) 2.5 MG tablet Take 1 tablet (2.5 mg total) by mouth daily.  90 tablet  3  . Multiple Vitamins-Minerals (EYE VITAMINS) CAPS Take 1 capsule by mouth daily.        Marland Kitchen  nitroGLYCERIN (NITROSTAT) 0.4 MG SL tablet Place 1 tablet (0.4 mg total) under the tongue every 5 (five) minutes as needed.  100 tablet  3  . potassium chloride SA (K-DUR,KLOR-CON) 20 MEQ tablet Take 1 tablet (20 mEq total) by mouth 2 (two) times daily.  180 tablet  3  . Tamsulosin HCl (FLOMAX) 0.4 MG CAPS Take 0.4 mg by mouth daily.      Marland Kitchen gabapentin (NEURONTIN) 300 MG capsule Take 1 capsule (300 mg total) by mouth 3 (three) times daily.  270 capsule  1  . metoprolol succinate (TOPROL-XL) 50 MG 24 hr tablet Take 1 tablet (50 mg total) by mouth daily. Take with or immediately following a meal.  90 tablet  3   No  facility-administered medications prior to visit.    PAST MEDICAL HISTORY: Past Medical History  Diagnosis Date  . Hypertension   . Seizure disorder   . Hyperlipidemia   . Bradycardia     secondary to Atenolol  . Gout   . Obesity   . DVT (deep venous thrombosis) 2003    Right lower extremity  . Bladder calculi     status cystoscopy  . BPH (benign prostatic hypertrophy)     status post TURP  . History of stroke   . CAD (coronary artery disease)     a. NSTEMI (8/12). LHC with total occlusion LAD and collaterals from RCA, 90% serial CFX stenoses. Patient was evaluated for CABG but was turned down by CVTS given obesity and poor functional status. He had DES to CFX placed instead.   . Chronic diastolic heart failure     a. Echo (8/12) with EF 45-50%, anteroseptal hypokinesis, mild LV hypertrophy, normal RV, aortic sclerosis;  b. Echo (3/13) with EF 50-55%, mild AS.   Marland Kitchen Aortic stenosis     a. mild by echo 04/2011  . GERD (gastroesophageal reflux disease)   . Neuropathy   . Stroke     PAST SURGICAL HISTORY: Past Surgical History  Procedure Laterality Date  . Transurethral resection of prostate    . Cataract surgery      FAMILY HISTORY: Family History  Problem Relation Age of Onset  . Lung cancer Father   . Suicidality Son   . Heart attack Son   . Heart attack Son   . Fainting Brother     SOCIAL HISTORY: History   Social History  . Marital Status: Married    Spouse Name: Barbaraann Share     Number of Children: 4  . Years of Education: 12   Occupational History  . Not on file.   Social History Main Topics  . Smoking status: Never Smoker   . Smokeless tobacco: Former Systems developer    Types: [place] date: 02/26/1989  . Alcohol Use: No     Comment: Pt reports he use to drink alcohol  . Drug Use: No  . Sexual Activity: Not on file   Other Topics Concern  . Not on file   Social History Narrative   Pt has 4 sons.  Only one still living.  Lives in [place]  Filed Vitals:   04/08/13 1325  BP: 107/66  Pulse: 57  Height: 6\' 2"  (1.88 m)  Weight: 360 lb (163.295 kg)   Body mass index is 46.2 kg/(m^2).  Generalized: Well developed, morbidly obese male in no acute distress  Head: normocephalic and atraumatic,. Oropharynx benign  Neck: Supple, no carotid bruits  Neck  size 20 inches Cardiac: Regular rate rhythm,   Musculoskeletal: No deformity  Skin 1 + ankle edema  Neurological examination   Mentation: Alert oriented to time, place, history taking. Follows all commands speech and language fluent. ESS 5  Cranial nerve II-XII: Pupils were equal round reactive to light extraocular movements were full, visual field were full on confrontational test. Facial sensation and strength were normal. Hearing decreased bilaterally. Uvula tongue midline. head turning and shoulder shrug were normal and symmetric.Tongue protrusion into cheek strength was normal. Motor: normal bulk and tone, full strength in the BUE, BLE,  No focal weakness Sensory: Diminished pinprick to mid shin bilaterally diminished vibratory to ankles. Touch sensation is normal Coordination: finger-nose-finger,  no dysmetria Reflexes: Brachioradialis 2/2, biceps 2/2, triceps 2/2, patellar 2/2, Achilles absent, plantar responses were flexor bilaterally. Gait and Station: in wheelchair  DIAGNOSTIC DATA (LABS, IMAGING, TESTING) - I reviewed patient records, labs, notes, testing and imaging myself where available.      Component Value Date/Time   NA 143 03/10/2013 1201   K 4.6 03/10/2013 1201   CL 104 03/10/2013 1201   CO2 31 03/10/2013 1201   GLUCOSE 88 03/10/2013 1201   BUN 24* 03/10/2013 1201   CREATININE 1.5 03/10/2013 1201   CALCIUM 9.5 03/10/2013 1201   PROT 6.9 09/14/2011 1001   ALBUMIN 3.2* 09/14/2011 1001   AST 30 09/14/2011 1001   ALT 34 09/14/2011 1001   ALKPHOS 104 09/14/2011 1001   BILITOT 0.5 09/14/2011 1001   GFRNONAA >60 10/23/2010 0625   GFRAA >60 10/23/2010 0625   Lab  Results  Component Value Date   CHOL 114 03/10/2013   HDL 36.20* 03/10/2013   LDLCALC 56 03/10/2013   LDLDIRECT 132.2 03/06/2011   TRIG 111.0 03/10/2013   CHOLHDL 3 03/10/2013       ASSESSMENT AND PLAN  79 y.o. year old male  has a past medical history of Hypertension; Seizure disorder; Hyperlipidemia; Bradycardia; ; Obesity; DVT (deep venous thrombosis) (2003);  CAD (coronary artery disease); Chronic diastolic heart failure; Aortic stenosis;  Neuropathy; and Stroke here to followup  Patient to continue Plavix and ASA for secondary stroke prevention.  Continue Gabapentin at current dose. Will set up for sleep study Dennie Bible, [facility], [facility], [facility] [address] [place], [place] [address] [phone]

## 2013-04-08 NOTE — Patient Instructions (Signed)
Patient to continue Plavix and ASA for secondary stroke prevention.  Continue Gabapentin at current dose. Will set up for sleep study

## 2013-04-08 NOTE — Progress Notes (Signed)
I have read the note, and I agree with the clinical assessment and plan.  WILLIS,CHARLES KEITH   

## 2013-04-17 ENCOUNTER — Telehealth: Payer: Self-pay | Admitting: Neurology

## 2013-04-17 DIAGNOSIS — I639 Cerebral infarction, unspecified: Secondary | ICD-10-CM

## 2013-04-17 NOTE — Telephone Encounter (Addendum)
Benjamin Kaiser, refers patient for attended sleep study.  Height: 6'2  Weight: 360 lbs.  BMI: 46.20  Past Medical History:  Hypertension  Seizure disorder  Hyperlipidemia  Bradycardia  secondary to Atenolol  Gout  Obesity  DVT (deep venous thrombosis)  2003  Right lower extremity  Bladder calculi  status cystoscopy  BPH (benign prostatic hypertrophy)  status post TURP  History of stroke  CAD (coronary artery disease)  a. NSTEMI (8/12). LHC with total occlusion LAD and collaterals from RCA, 90% serial CFX stenoses. Patient was evaluated for CABG but was turned down by CVTS given obesity and poor functional status. He had DES to CFX placed instead. Chronic diastolic heart failure  a. Echo (8/12) with EF 45-50%, anteroseptal hypokinesis, mild LV hypertrophy, normal RV, aortic sclerosis; b. Echo (3/13) with EF 50-55%, mild AS.  Aortic stenosis  a. mild by echo 04/2011  GERD (gastroesophageal reflux disease)  Neuropathy  Stroke    Sleep Symptoms: He does snore at night, but wife is not sure if he quits breathing or not   Epworth Score: 5  Medications:  Aspirin (Tab) aspirin 81 MG Take 81 mg by mouth daily. Atorvastatin Calcium (Tab) LIPITOR 80 MG Take 1 tablet by mouth daily Clopidogrel Bisulfate (Tab) PLAVIX 75 MG Take 1 tablet (75 mg total) by mouth daily. Ezetimibe (Tab) ZETIA 10 MG Take 1 tablet by mouth daily Furosemide (Tab) LASIX 40 MG Take 1 tablet by mouth daily. Gabapentin (Cap) NEURONTIN 300 MG Take 1 capsule (300 mg total) by mouth 3 (three) times daily. Ibuprofen (Tab) ADVIL,MOTRIN 200 MG Take 200 mg by mouth every 6 (six) hours as needed. Isosorbide Mononitrate (Tablet SR 24 hr) IMDUR 30 MG Take 1 tablet by mouth daily Lisinopril (Tab) PRINIVIL,ZESTRIL 2.5 MG Take 1 tablet (2.5 mg total) by mouth daily. Metoprolol Succinate (Tablet SR 24 hr) TOPROL-XL 50 MG Take 50 mg by mouth daily. Take a half a tab daily. Take with or immediately following a meal. Multiple  Vitamins-Minerals (Cap) EYE VITAMINS Take 1 capsule by mouth daily. Nitroglycerin (SL Tab) NITROSTAT 0.4 MG Place 1 tablet (0.4 mg total) under the tongue every 5 (five) minutes as needed. Potassium Chloride Crys CR (Tab CR) K-DUR,KLOR-CON 20 MEQ Take 1 tablet (20 mEq total) by mouth 2 (two) times daily. Tamsulosin HCl (Cap) FLOMAX 0.4 MG Take 0.4 mg by mouth daily.   Insurance: [employer]-Medicare   Carolyn's Assessment and Plan:    79 y.o. year old male aptient of dr sethi ( stroke )  has a past medical history of Hypertension; Seizure disorder; Hyperlipidemia; Bradycardia; ; Obesity; DVT (deep venous thrombosis) (2003); CAD (coronary artery disease); Chronic diastolic heart failure; Aortic stenosis; Neuropathy; and Stroke here to followup  Patient to continue Plavix and ASA for secondary stroke prevention.  Continue Gabapentin at current dose.  Will set up for sleep  SPLIT study     Please review patient information and submit instructions for scheduling and orders for sleep technologist.  Thank you!

## 2013-04-20 NOTE — Telephone Encounter (Signed)
SPLIT with CO2, patient has CVA as a comorbidity.  What's his insurance ? Medicare will allow for split at AHI 15 4% score, he may need to be titrated to oxygen if hypoxix without sif gnificant apnea. Please note that medicare now requests inlab titration to 02 to document need.

## 2013-04-22 ENCOUNTER — Telehealth: Payer: Self-pay | Admitting: Cardiology

## 2013-04-22 NOTE — Telephone Encounter (Signed)
Spoke with pt's wife and gave her information from Dr. Aundra Dubin

## 2013-04-22 NOTE — Telephone Encounter (Signed)
New message     Dr Willaim Rayas want pt to have a sleep apnea test.   Pt does not want to have this test.  Pt want to know if Dr Aundra Dubin thinks it is necessary. Pt do not know why a neurologist want him to have a sleep apnea test when he already sees a heart doctor

## 2013-04-22 NOTE — Telephone Encounter (Signed)
Spoke with pt's wife who reports neurology has recommended pt have a sleep study. Pt is not sure he wants to have this done but will do if Dr. Aundra Dubin feels he needs to have it. Wife reports pt sleeps OK but does move his hands and feet during his sleep. Breathes through his mouth and uses breathe right strips. Occasional snoring but not every night. Will forward to Dr. Aundra Dubin to see if he feels pt should have sleep study.

## 2013-04-22 NOTE — Telephone Encounter (Signed)
It probably would be a good idea, I would not be surprised if he had sleep apnea.

## 2013-08-20 ENCOUNTER — Other Ambulatory Visit: Payer: Self-pay | Admitting: Nurse Practitioner

## 2013-10-02 ENCOUNTER — Ambulatory Visit (INDEPENDENT_AMBULATORY_CARE_PROVIDER_SITE_OTHER): Payer: Medicare Other | Admitting: Cardiology

## 2013-10-02 ENCOUNTER — Encounter: Payer: Self-pay | Admitting: Cardiology

## 2013-10-02 VITALS — BP 110/64 | HR 64 | Ht 74.0 in

## 2013-10-02 DIAGNOSIS — I509 Heart failure, unspecified: Secondary | ICD-10-CM

## 2013-10-02 DIAGNOSIS — I5032 Chronic diastolic (congestive) heart failure: Secondary | ICD-10-CM

## 2013-10-02 DIAGNOSIS — I2510 Atherosclerotic heart disease of native coronary artery without angina pectoris: Secondary | ICD-10-CM

## 2013-10-02 DIAGNOSIS — Z79899 Other long term (current) drug therapy: Secondary | ICD-10-CM

## 2013-10-02 DIAGNOSIS — I359 Nonrheumatic aortic valve disorder, unspecified: Secondary | ICD-10-CM

## 2013-10-02 DIAGNOSIS — I350 Nonrheumatic aortic (valve) stenosis: Secondary | ICD-10-CM

## 2013-10-02 MED ORDER — NITROGLYCERIN 0.4 MG SL SUBL: 0.4000 mg | SUBLINGUAL_TABLET | SUBLINGUAL | Status: AC | PRN

## 2013-10-02 MED ORDER — PANTOPRAZOLE SODIUM 40 MG PO TBEC: 40.0000 mg | DELAYED_RELEASE_TABLET | Freq: Every day | ORAL | Status: DC

## 2013-10-02 NOTE — Patient Instructions (Signed)
Please start Protonix 40 mg a day. Continue all other medications as listed.  Please have blood work today.  Your physician has requested that you have an echocardiogram. Echocardiography is a painless test that uses sound waves to create images of your heart. It provides your doctor with information about the size and shape of your heart and how well your heart's chambers and valves are working. This procedure takes approximately one hour. There are no restrictions for this procedure.  Follow up in 6 months with Dr Aundra Dubin.  You will receive a letter in the mail 2 months before you are due.  Please call us when you receive this letter to schedule your follow up appointment.

## 2013-10-03 LAB — BASIC METABOLIC PANEL
BUN: 22 mg/dL (ref 6–23)
CHLORIDE: 103 meq/L (ref 96–112)
CO2: 28 mEq/L (ref 19–32)
CREATININE: 1.49 mg/dL — AB (ref 0.50–1.35)
Calcium: 9.7 mg/dL (ref 8.4–10.5)
Glucose, Bld: 140 mg/dL — ABNORMAL HIGH (ref 70–99)
Potassium: 5.3 mEq/L (ref 3.5–5.3)
SODIUM: 140 meq/L (ref 135–145)

## 2013-10-04 DIAGNOSIS — I350 Nonrheumatic aortic (valve) stenosis: Secondary | ICD-10-CM | POA: Insufficient documentation

## 2013-10-04 NOTE — Progress Notes (Signed)
Patient ID: Benjamin Kaiser, male   DOB: Oct 06, 1934, 79 y.o.   MRN: [id number] PCP: Dr. Arelia Sneddon  79 yo with history of morbid obesity, diastolic CHF, and CAD presents for cardiology followup.  Patient presented to [facility] with chest pain in 8/12 and was found to have NSTEMI.  Left heart cath showed a total occlusion of the proximal LAD with collaterals from the RCA.  There were serial 90% stenoses in the circumflex.  He was evaluated for CABG but was turned down due to obesity and poor functional status.  He had a drug-eluting stent placed in the CFX and the LAD was not intervened upon (chronic total occlusion).   Most recent echo in 3/13 showed EF 50-55% with mild AS.   MRI of the head in 2/14 showed multiple chronic infarcts.  I had him wear a 30 day monitor in 3/14, this did not show any atrial fibrillation.  However, CTA neck showed chronic occlusion of the RICA, which may be the etiology of the embolic CVAs.    Patient is stable symptomatically.  He continues to be minimally active.  He has a severe peripheral neuropathy and is not very steady on his feet.  Per his wife, he is not getting up to do anything.  Very little motivation.  Eats whatever he wants.  No chest pain episodes recently.  No lightheadedness.  No tachypalpitations.  He continues to have chronic back pain.  He is using his wheelchair pretty much all the time.  He has occasional epigastric pain, usually when he goes to bed right after eating.   Labs (8/12): K 3.8, creatinine 1.05 Labs (10/12): BNP 64 Labs (11/12): K 3.4, creatinine 1.1, HDL 46, TGs 217, LDL 129 Labs (12/12): K 4.1, creatinine 1.0 Labs (1/13): LDL 132, HDL 48 Labs (3/13): LDL 57, HDL 54, K 4.4, creatinine 1.1, proBNP 36 Labs (7/13): LDL 53, HDL 51 Labs (3/14): LDL 73, HDL 48, K 4.3, creatinine 1.2, BNP 39 Labs (1/15): LDL 56, HDL 36, K 4.6, creatinine 1.5  PMH: 1. CVA: MRI 2/14 with multiple chronic infarcts.  CTA neck showed occluded RICA, LICA without  significant disease.  30 day event monitor in 3/14 did not show atrial fibrillation.  2. HTN 3. Right lower leg DVT in 2003 4. Obesity 5. CAD: NSTEMI (8/12).  LHC with total occlusion LAD and collaterals from RCA, 90% serial CFX stenoses.  Patient was evaluated for CABG but was turned down by CVTS given obesity and poor functional status.  He had DES to CFX placed instead.   6. Diastolic CHF: Echo (0/07) with EF 45-50%, anteroseptal hypokinesis, mild LV hypertrophy, normal RV, aortic sclerosis.  Echo (3/13) with EF 50-55%, mild AS.  7. Hyperlipidemia 8. Gout 9. Seizure disorder 10. Bladder calculi 11. BPH 12. Aortic stenosis: Mild on 3/13 echo.  13. GERD 14. 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  15. Carotid stenosis: Carotid dopplers (2/15) with totally occluded RICA.   SH: Lives in [place], married.  1 living son.  Prior smoker.   FH: 2 sons died of MIs in their 21s.  Another son committed suicide.    ROS: All systems reviewed and negative except as per HPI.    Current Outpatient Prescriptions  Medication Sig Dispense Refill  . aspirin 81 MG tablet Take 81 mg by mouth daily.        Marland Kitchen atorvastatin (LIPITOR) 80 MG tablet Take 1 tablet by mouth  daily  90 tablet  3  . clopidogrel (PLAVIX) 75  MG tablet Take 1 tablet (75 mg total) by mouth daily.  90 tablet  3  . ezetimibe (ZETIA) 10 MG tablet Take 1 tablet by mouth  daily  90 tablet  3  . furosemide (LASIX) 40 MG tablet Take 1 tablet by mouth  daily.  90 tablet  3  . gabapentin (NEURONTIN) 300 MG capsule Take 1 capsule by mouth 3  times daily  270 capsule  0  . ibuprofen (ADVIL,MOTRIN) 200 MG tablet Take 200 mg by mouth every 6 (six) hours as needed.      . isosorbide mononitrate (IMDUR) 30 MG 24 hr tablet Take 1 tablet by mouth  daily  90 tablet  3  . lisinopril (PRINIVIL,ZESTRIL) 2.5 MG tablet Take 1 tablet (2.5 mg total) by mouth daily.  90 tablet  3  . metoprolol succinate (TOPROL-XL) 50 MG 24 hr tablet Take 50 mg by mouth  daily. Take a half a tab daily. Take with or immediately following a meal.      . Multiple Vitamins-Minerals (EYE VITAMINS) CAPS Take 1 capsule by mouth daily.        . nitroGLYCERIN (NITROSTAT) 0.4 MG SL tablet Place 1 tablet (0.4 mg total) under the tongue every 5 (five) minutes as needed.  25 tablet  prn  . potassium chloride SA (K-DUR,KLOR-CON) 20 MEQ tablet Take 1 tablet (20 mEq total) by mouth 2 (two) times daily.  180 tablet  3  . Tamsulosin HCl (FLOMAX) 0.4 MG CAPS Take 0.4 mg by mouth daily.      . pantoprazole (PROTONIX) 40 MG tablet Take 1 tablet (40 mg total) by mouth daily.  30 tablet  1   No current facility-administered medications for this visit.    BP 110/64  Pulse 64  Ht 6\' 2"  (1.88 m)  SpO2 96% General: Obese, NAD Neck: Thick neck, no JVD, no thyromegaly or thyroid nodule.  Lungs: Clear to auscultation bilaterally with normal respiratory effort. CV: Nondisplaced PMI.  Heart regular S1/S2, no S3/S4, 1/6 SEM RUSB.  No edema.  Soft right carotid bruit.  Normal pedal pulses.  Abdomen: Soft, nontender, no hepatosplenomegaly, no distention.   Neurologic: Alert and oriented x 3.  Psych: Normal affect. Extremities: No clubbing or cyanosis.   Assessment/Plan: CAD  S/p PCI to CFX. Also has chronic total occlusion of LAD with collaterals from the RCA. Patient was seen by CVTS and deemed a poor CABG candidate due to obesity and poor functional status. No recent chest pain.  EF low normal on last echo. - Continue ASA 81, Plavix, Imdur, Toprol XL, ACEI, and statin.  Diastolic CHF, chronic Patient has exertional dyspnea with a component of diastolic CHF. However, I think that the main factor is obesity and deconditioning. His functional status is poor. He is not very active at all.  Poor balance from peripheral neuropathy is a part of his problem as well.  NYHA class III symptoms are stable.  He does not appear significantly volume overloaded at this time.  - Continue current Lasix  dosing and check BMET today.   Morbid obesity  Very inactive and eats what he wants. He seems to lack much motivation to change his lifestyle.  I suspect that there is a major component of depression.  I would like him to followup with his primary care about this.  Hyperlipidemia Good lipids in 1/15.  Continue statin, Zetia.  CVA Suspect CVA noted on MRI in 2/14 was due to carotid disease.  He has an occluded  RICA.  Minimal LICA disease.  Will get carotid dopplers in 2/11 to follow LICA.  He is on ASA and Plavix.  No sign of atrial fibrillation on 30 day monitor in 3/14.  GERD Suspect nocturnal epigastric pain is GERD-related, will start Protonix 40 mg daily.  Aortic stenosis Repeat echo to follow AS.    Loralie Champagne 10/04/2013

## 2013-10-05 ENCOUNTER — Other Ambulatory Visit: Payer: Self-pay | Admitting: *Deleted

## 2013-10-05 DIAGNOSIS — I5032 Chronic diastolic (congestive) heart failure: Secondary | ICD-10-CM

## 2013-10-07 ENCOUNTER — Ambulatory Visit: Payer: Medicare Other | Admitting: Nurse Practitioner

## 2013-10-12 ENCOUNTER — Other Ambulatory Visit: Payer: Self-pay | Admitting: *Deleted

## 2013-10-12 ENCOUNTER — Ambulatory Visit (HOSPITAL_COMMUNITY): Payer: Medicare Other | Attending: Cardiology | Admitting: Radiology

## 2013-10-12 ENCOUNTER — Other Ambulatory Visit (INDEPENDENT_AMBULATORY_CARE_PROVIDER_SITE_OTHER): Payer: Medicare Other

## 2013-10-12 DIAGNOSIS — I5032 Chronic diastolic (congestive) heart failure: Secondary | ICD-10-CM

## 2013-10-12 DIAGNOSIS — I359 Nonrheumatic aortic valve disorder, unspecified: Secondary | ICD-10-CM | POA: Diagnosis present

## 2013-10-12 LAB — BASIC METABOLIC PANEL
BUN: 17 mg/dL (ref 6–23)
CHLORIDE: 104 meq/L (ref 96–112)
CO2: 29 meq/L (ref 19–32)
CREATININE: 1.7 mg/dL — AB (ref 0.4–1.5)
Calcium: 8.6 mg/dL (ref 8.4–10.5)
GFR: 42 mL/min — ABNORMAL LOW (ref >60.00)
Glucose, Bld: 129 mg/dL — ABNORMAL HIGH (ref 70–99)
POTASSIUM: 4 meq/L (ref 3.5–5.1)
Sodium: 142 mEq/L (ref 135–145)

## 2013-10-12 NOTE — Progress Notes (Signed)
Echocardiogram performed.  

## 2013-10-26 ENCOUNTER — Other Ambulatory Visit (INDEPENDENT_AMBULATORY_CARE_PROVIDER_SITE_OTHER): Payer: Medicare Other

## 2013-10-26 DIAGNOSIS — I5032 Chronic diastolic (congestive) heart failure: Secondary | ICD-10-CM

## 2013-10-26 LAB — BASIC METABOLIC PANEL
BUN: 14 mg/dL (ref 6–23)
CO2: 27 meq/L (ref 19–32)
Calcium: 8.9 mg/dL (ref 8.4–10.5)
Chloride: 107 mEq/L (ref 96–112)
Creatinine, Ser: 1.4 mg/dL (ref 0.4–1.5)
GFR: 53.51 mL/min — AB (ref >60.00)
GLUCOSE: 115 mg/dL — AB (ref 70–99)
POTASSIUM: 4.1 meq/L (ref 3.5–5.1)
Sodium: 143 mEq/L (ref 135–145)

## 2013-12-03 ENCOUNTER — Other Ambulatory Visit: Payer: Self-pay | Admitting: Cardiology

## 2013-12-09 ENCOUNTER — Other Ambulatory Visit: Payer: Self-pay | Admitting: Cardiology

## 2013-12-09 ENCOUNTER — Other Ambulatory Visit: Payer: Self-pay | Admitting: Neurology

## 2013-12-10 ENCOUNTER — Other Ambulatory Visit: Payer: Self-pay

## 2013-12-22 ENCOUNTER — Ambulatory Visit (INDEPENDENT_AMBULATORY_CARE_PROVIDER_SITE_OTHER): Payer: Medicare Other | Admitting: Nurse Practitioner

## 2013-12-22 ENCOUNTER — Encounter: Payer: Self-pay | Admitting: Nurse Practitioner

## 2013-12-22 VITALS — BP 106/67 | HR 57

## 2013-12-22 DIAGNOSIS — R269 Unspecified abnormalities of gait and mobility: Secondary | ICD-10-CM

## 2013-12-22 DIAGNOSIS — G609 Hereditary and idiopathic neuropathy, unspecified: Secondary | ICD-10-CM

## 2013-12-22 NOTE — Progress Notes (Signed)
PATIENT: Benjamin Kaiser DOB: Aug 22, 1934  REASON FOR VISIT: routine follow up for peripheral neuropathy  HISTORY FROM: patient and wife  HISTORY OF PRESENT ILLNESS: Benjamin Kaiser, 79 year old male returns for followup. He was last seen in this office 04/08/13 with Benjamin Rubin, NP. He has a history of peripheral neuropathy and generalized weakness for several years. He has a long-standing history of Dilantin use for a single isolated seizure. This was titrated off as Dilantin can can cause a gait disorder. He is short of breath with exertion, likely due to his morbid obesity, deconditioning and history of diastolic heart failure.  He has continued to gain weight, is approximately 360 lbs now. He is currently on gabapentin but only takes 300 in the am and 600 in the pm. He does not watch his diet and seems to lack insight and/or motivation to make any changes to loose weight. He does snore at night, but he does not want to do a sleep study. He has fallen recently, and wife had to call EMS for lifting assistance. He uses a quad cane but cannot ambulate more than 15 ft without exhaustion. He returns for reevaluation. He has not had further stroke or TIA symptoms.  HISTORY: He has had generalized weakness for several years. Long-standing use of Dilantin since the 90s following a solitary seizure. Last level of Dilantin on 05/01/12 was 5.7, Dr. [name] had decreased dose at his last visit in hopes of getting him off of the medication altogether as it can also cause gait disorders. He complains of a burning pain in his feet more troublesome at night. He does have a history of alcohol abuse. He denies any falls since last seen, seated in a wheelchair today and ambulates with a quad cane. He is short of breath with exertion greater than 25 feet, history of chronic back pain as well. EMG nerve conduction shows severe peripheral neuropathy. He was placed on gabapentin at his last visit which has helped his symptoms. He  successfully tapered his Dilantin and has not had further seizure events. He had normal B12 level. Serum electrophoresis was normal   REVIEW OF SYSTEMS: Full 14 system review of systems performed and notable only for: hearing loss, ear pain, runny nose, shortness of breath, eye discharge, cold intolerance, swollen abdomen, constipation, restless leg, difficulty urinating, back pain, muscle cramps, walking difficulty, weakness  ALLERGIES: No Known Allergies  HOME MEDICATIONS: Outpatient Prescriptions Prior to Visit  Medication Sig Dispense Refill  . aspirin 81 MG tablet Take 81 mg by mouth daily.        Marland Kitchen atorvastatin (LIPITOR) 80 MG tablet Take 1 tablet by mouth  daily  90 tablet  3  . clopidogrel (PLAVIX) 75 MG tablet Take 1 tablet by mouth  daily  90 tablet  1  . furosemide (LASIX) 40 MG tablet Take 1 tablet by mouth  daily  90 tablet  1  . gabapentin (NEURONTIN) 300 MG capsule Take 1 capsule by mouth 3  times daily  270 capsule  0  . ibuprofen (ADVIL,MOTRIN) 200 MG tablet Take 200 mg by mouth every 6 (six) hours as needed.      . isosorbide mononitrate (IMDUR) 30 MG 24 hr tablet Take 1 tablet by mouth  daily  90 tablet  3  . metoprolol succinate (TOPROL-XL) 50 MG 24 hr tablet Take 50 mg by mouth daily. Take a half a tab daily. Take with or immediately following a meal.      .  Multiple Vitamins-Minerals (EYE VITAMINS) CAPS Take 1 capsule by mouth daily.        . nitroGLYCERIN (NITROSTAT) 0.4 MG SL tablet Place 1 tablet (0.4 mg total) under the tongue every 5 (five) minutes as needed.  25 tablet  prn  . pantoprazole (PROTONIX) 40 MG tablet TAKE 1 TABLET BY MOUTH ONCE DAILY  30 tablet  11  . Tamsulosin HCl (FLOMAX) 0.4 MG CAPS Take 0.4 mg by mouth daily.      Marland Kitchen ZETIA 10 MG tablet Take 1 tablet by mouth  daily  90 tablet  1   No facility-administered medications prior to visit.    PHYSICAL EXAM Filed Vitals:   12/22/13 1010  BP: 106/67  Pulse: 57    Generalized: Well developed,  morbidly obese male in no acute distress  Head: normocephalic and atraumatic, Oropharynx benign  Neck: Supple, Neck size 20 inches  Cardiac: Regular rate rhythm, 2/6 systolic murmur, Soft right carotid bruit Musculoskeletal: No deformity  Skin 1 + ankle edema   Neurological examination  Mentation: Alert oriented to time, place, history taking. Follows all commands speech and language fluent.   Cranial nerve II-XII: Pupils were equal round reactive to light extraocular movements were full, visual field were full on confrontational test. Facial sensation and strength were normal. Very HOH. Uvula tongue midline. Head turning and shoulder shrug were normal and symmetric.Tongue protrusion into cheek strength was normal.  Motor: normal bulk and tone, full strength in the BUE, BLE, No focal weakness  Sensory: Diminished pinprick to mid shin bilaterally diminished vibratory to ankles. Touch sensation is normal  Coordination: finger-nose-finger, no dysmetria, not able to perform heel to shin due to girth Reflexes: Brachioradialis 2/2, biceps 2/2, triceps 2/2, patellar 2/2, Achilles absent, plantar responses were flexor bilaterally.  Gait and Station: not tested, in wheelchair   ASSESSMENT: 79 y.o. year old male has a past medical history of Hypertension; Seizure disorder; Hyperlipidemia; Bradycardia; ; Obesity; DVT (deep venous thrombosis) (2003); CAD (coronary artery disease); Chronic diastolic heart failure; Aortic stenosis; Neuropathy; and Stroke here to followup for peripheral neuropathy. He is increasingly less ambulatory, and functional status is poor. He has little motivation to make lifestyle changes to help himself, likely untreated depression is a factor.  I encouraged him to cut his portion sizes, avoid desserts, and eliminate sugary drinks to loose weight, or he will be non-ambulatory in the near future and his wife will be unable to care for him.  PLAN: Patient to continue Plavix and ASA for  secondary stroke prevention.  Continue Gabapentin at current dose.  Rx for new Quad cane and Oversized Rollator walker with seat. Referral to [facility] for home evaluation of DME needs (is house equipped for wheelchair or power wheelchair?) Follow up in 6 months with Dr. [name], sooner as needed.  Orders Placed This Encounter  Procedures  . For home use only DME 4 wheeled rolling walker with seat  . DME Other see comment  . Ambulatory Referral for DME   Return in about 6 months (around 06/23/2014) for Neuropathy.  Benjamin Rummage Flossie Wexler, MSN, FNP-BC, A/GNP-C 12/22/2013, 12:18 PM [facility] [address], [place], [place] [address] [phone]  Note: This document was prepared with digital dictation and possible smart phrase technology. Any transcriptional errors that result from this process are unintentional.

## 2013-12-22 NOTE — Patient Instructions (Addendum)
Continue Gabapentin 300 mg up to three times daily.  I am giving you an Rx for a [employer], an Soil scientist with seat, and a referral to [facility] for evaluation for a Electric wheelchair.  [facility] should contact you about this order.  Follow up with Dr. [name] in 6 months, sooner as needed.    Peripheral Neuropathy Peripheral neuropathy is a type of nerve damage. It affects nerves that carry signals between the spinal cord and other parts of the body. These are called peripheral nerves. With peripheral neuropathy, one nerve or a group of nerves may be damaged.  CAUSES  Many things can damage peripheral nerves. For some people with peripheral neuropathy, the cause is unknown. Some causes include:  Diabetes. This is the most common cause of peripheral neuropathy.  Injury to a nerve.  Pressure or stress on a nerve that lasts a long time.  Too little vitamin B. Alcoholism can lead to this.  Infections.  Autoimmune diseases, such as multiple sclerosis and systemic lupus erythematosus.  Inherited nerve diseases.  Some medicines, such as cancer drugs.  Toxic substances, such as lead and mercury.  Too little blood flowing to the legs.  Kidney disease.  Thyroid disease. SIGNS AND SYMPTOMS  Different people have different symptoms. The symptoms you have will depend on which of your nerves is damaged. Common symptoms include:  Loss of feeling (numbness) in the feet and hands.  Tingling in the feet and hands.  Pain that burns.  Very sensitive skin.  Weakness.  Not being able to move a part of the body (paralysis).  Muscle twitching.  Clumsiness or poor coordination.  Loss of balance.  Not being able to control your bladder.  Feeling dizzy.  Sexual problems. DIAGNOSIS  Peripheral neuropathy is a symptom, not a disease. Finding the cause of peripheral neuropathy can be hard. To figure that out, your health care provider will take a medical history  and do a physical exam. A neurological exam will also be done. This involves checking things affected by your brain, spinal cord, and nerves (nervous system). For example, your health care provider will check your reflexes, how you move, and what you can feel.  Other types of tests may also be ordered, such as:  Blood tests.  A test of the fluid in your spinal cord.  Imaging tests, such as CT scans or an MRI.  Electromyography (EMG). This test checks the nerves that control muscles.  Nerve conduction velocity tests. These tests check how fast messages pass through your nerves.  Nerve biopsy. A small piece of nerve is removed. It is then checked under a microscope. TREATMENT   Medicine is often used to treat peripheral neuropathy. Medicines may include:  Pain-relieving medicines. Prescription or over-the-counter medicine may be suggested.  Antiseizure medicine. This may be used for pain.  Antidepressants. These also may help ease pain from neuropathy.  Lidocaine. This is a numbing medicine. You might wear a patch or be given a shot.  Mexiletine. This medicine is typically used to help control irregular heart rhythms.  Surgery. Surgery may be needed to relieve pressure on a nerve or to destroy a nerve that is causing pain.  Physical therapy to help movement.  Assistive devices to help movement. HOME CARE INSTRUCTIONS   Only take over-the-counter or prescription medicines as directed by your health care provider. Follow the instructions carefully for any given medicines. Do not take any other medicines without first getting approval from your  health care provider.  If you have diabetes, work closely with your health care provider to keep your blood sugar under control.  If you have numbness in your feet:  Check every day for signs of injury or infection. Watch for redness, warmth, and swelling.  Wear padded socks and comfortable shoes. These help protect your feet.  Do not  do things that put pressure on your damaged nerve.  Do not smoke. Smoking keeps blood from getting to damaged nerves.  Avoid or limit alcohol. Too much alcohol can cause a lack of B vitamins. These vitamins are needed for healthy nerves.  Develop a good support system. Coping with peripheral neuropathy can be stressful. Talk to a mental health specialist or join a support group if you are struggling.  Follow up with your health care provider as directed. SEEK MEDICAL CARE IF:   You have new signs or symptoms of peripheral neuropathy.  You are struggling emotionally from dealing with peripheral neuropathy.  You have a fever. SEEK IMMEDIATE MEDICAL CARE IF:   You have an injury or infection that is not healing.  You feel very dizzy or begin vomiting.  You have chest pain.  You have trouble breathing. Document Released: 02/02/2002 Document Revised: 10/25/2010 Document Reviewed: 10/20/2012 [facility] Patient Information 2015 [place], [place]. This information is not intended to replace advice given to you by your health care provider. Make sure you discuss any questions you have with your health care provider.

## 2013-12-23 NOTE — Progress Notes (Signed)
I agree with the above plan 

## 2014-01-13 ENCOUNTER — Encounter: Payer: Self-pay | Admitting: Neurology

## 2014-01-19 ENCOUNTER — Encounter: Payer: Self-pay | Admitting: Neurology

## 2014-04-06 ENCOUNTER — Other Ambulatory Visit: Payer: Self-pay | Admitting: Radiology

## 2014-04-06 DIAGNOSIS — I6523 Occlusion and stenosis of bilateral carotid arteries: Secondary | ICD-10-CM

## 2014-04-17 ENCOUNTER — Other Ambulatory Visit: Payer: Self-pay | Admitting: Cardiology

## 2014-04-17 ENCOUNTER — Other Ambulatory Visit: Payer: Self-pay | Admitting: Neurology

## 2014-04-21 ENCOUNTER — Ambulatory Visit (HOSPITAL_COMMUNITY): Payer: Medicare Other | Attending: Cardiology | Admitting: *Deleted

## 2014-04-21 ENCOUNTER — Encounter: Payer: Self-pay | Admitting: Cardiology

## 2014-04-21 ENCOUNTER — Ambulatory Visit (INDEPENDENT_AMBULATORY_CARE_PROVIDER_SITE_OTHER): Payer: Medicare Other | Admitting: Cardiology

## 2014-04-21 VITALS — BP 122/74 | HR 55 | Ht 74.0 in | Wt 368.0 lb

## 2014-04-21 DIAGNOSIS — E785 Hyperlipidemia, unspecified: Secondary | ICD-10-CM | POA: Insufficient documentation

## 2014-04-21 DIAGNOSIS — I10 Essential (primary) hypertension: Secondary | ICD-10-CM | POA: Insufficient documentation

## 2014-04-21 DIAGNOSIS — I5032 Chronic diastolic (congestive) heart failure: Secondary | ICD-10-CM

## 2014-04-21 DIAGNOSIS — I6523 Occlusion and stenosis of bilateral carotid arteries: Secondary | ICD-10-CM

## 2014-04-21 DIAGNOSIS — I639 Cerebral infarction, unspecified: Secondary | ICD-10-CM

## 2014-04-21 DIAGNOSIS — I2510 Atherosclerotic heart disease of native coronary artery without angina pectoris: Secondary | ICD-10-CM | POA: Insufficient documentation

## 2014-04-21 DIAGNOSIS — Z8673 Personal history of transient ischemic attack (TIA), and cerebral infarction without residual deficits: Secondary | ICD-10-CM | POA: Diagnosis not present

## 2014-04-21 LAB — BASIC METABOLIC PANEL
BUN: 18 mg/dL (ref 6–23)
CO2: 31 mEq/L (ref 19–32)
CREATININE: 1.33 mg/dL (ref 0.40–1.50)
Calcium: 9.1 mg/dL (ref 8.4–10.5)
Chloride: 104 mEq/L (ref 96–112)
GFR: 55.3 mL/min — ABNORMAL LOW (ref >60.00)
Glucose, Bld: 99 mg/dL (ref 70–99)
POTASSIUM: 3.9 meq/L (ref 3.5–5.1)
SODIUM: 141 meq/L (ref 135–145)

## 2014-04-21 LAB — CBC WITH DIFFERENTIAL/PLATELET
BASOS ABS: 0 10*3/uL (ref 0.0–0.1)
Basophils Relative: 0.4 % (ref 0.0–3.0)
Eosinophils Absolute: 0.3 10*3/uL (ref 0.0–0.7)
Eosinophils Relative: 3 % (ref 0.0–5.0)
HEMATOCRIT: 41.8 % (ref 39.0–52.0)
HEMOGLOBIN: 14 g/dL (ref 13.0–17.0)
LYMPHS ABS: 1.5 10*3/uL (ref 0.7–4.0)
Lymphocytes Relative: 13.8 % (ref 12.0–46.0)
MCHC: 33.6 g/dL (ref 30.0–36.0)
MCV: 86.3 fl (ref 78.0–100.0)
MONO ABS: 0.7 10*3/uL (ref 0.1–1.0)
MONOS PCT: 6.2 % (ref 3.0–12.0)
Neutro Abs: 8.5 10*3/uL — ABNORMAL HIGH (ref 1.4–7.7)
Neutrophils Relative %: 76.6 % (ref 43.0–77.0)
PLATELETS: 237 10*3/uL (ref 150.0–400.0)
RBC: 4.84 Mil/uL (ref 4.22–5.81)
RDW: 15.8 % — AB (ref 11.5–15.5)
WBC: 11.1 10*3/uL — ABNORMAL HIGH (ref 4.0–10.5)

## 2014-04-21 LAB — LIPID PANEL
CHOL/HDL RATIO: 3
CHOLESTEROL: 115 mg/dL (ref 0–200)
HDL: 33.1 mg/dL — AB (ref >39.00)
LDL CALC: 57 mg/dL (ref 0–99)
NonHDL: 81.9
TRIGLYCERIDES: 125 mg/dL (ref 0.0–149.0)
VLDL: 25 mg/dL (ref 0.0–40.0)

## 2014-04-21 MED ORDER — FUROSEMIDE 40 MG PO TABS: ORAL_TABLET | ORAL | Status: DC

## 2014-04-21 MED ORDER — CITALOPRAM HYDROBROMIDE 20 MG PO TABS: 20.0000 mg | ORAL_TABLET | Freq: Every day | ORAL | Status: DC

## 2014-04-21 NOTE — Progress Notes (Signed)
Carotid Duplex Exam Performed 

## 2014-04-21 NOTE — Patient Instructions (Addendum)
Increase lasix (furosemide) to 40mg  in the AM and 20mg  about 3-4 PM. This will be 1 of your 40mg  tablets in the AM and 1/2 of your 40mg  tablets about 3-4 PM.  Start Celexa 20mg  daily.  Your physician recommends that you have  lab work today--BMET/Lipid profile/CBCd  Your physician recommends that you return for lab work in: 2 weeks--BMET.  Your physician recommends that you schedule a follow-up appointment in: 4 months with Dr Aundra Dubin.

## 2014-04-21 NOTE — Progress Notes (Signed)
Patient ID: Benjamin Kaiser, male   DOB: 18-Jul-1934, 80 y.o.   MRN: [id number] PCP: Dr. Arelia Sneddon  80 yo with history of morbid obesity, diastolic CHF, and CAD presents for cardiology followup.  Patient presented to [facility] with chest pain in 8/12 and was found to have NSTEMI.  Left heart cath showed a total occlusion of the proximal LAD with collaterals from the RCA.  There were serial 90% stenoses in the circumflex.  He was evaluated for CABG but was turned down due to obesity and poor functional status.  He had a drug-eluting stent placed in the CFX and the LAD was not intervened upon (chronic total occlusion).   Most recent echo in 3/13 showed EF 50-55% with mild AS.   MRI of the head in 2/14 showed multiple chronic infarcts.  I had him wear a 30 day monitor in 3/14, this did not show any atrial fibrillation.  However, CTA neck showed chronic occlusion of the RICA, which may be the etiology of the embolic CVAs.    He continues to be minimally active.  He has a severe peripheral neuropathy and is not very steady on his feet.  Per his wife, he is not getting up to do anything.  Very little motivation.  Eats whatever he wants.  No chest pain episodes recently.  No lightheadedness.  No tachypalpitations.  He continues to have chronic back pain.  He is using his wheelchair pretty much all the time.  Dyspnea is worse than in the past, any time he tries to get up he gets short of breath.  He tried to use a walker but fell and has had left hip pain since then.   Labs (8/12): K 3.8, creatinine 1.05 Labs (10/12): BNP 64 Labs (11/12): K 3.4, creatinine 1.1, HDL 46, TGs 217, LDL 129 Labs (12/12): K 4.1, creatinine 1.0 Labs (1/13): LDL 132, HDL 48 Labs (3/13): LDL 57, HDL 54, K 4.4, creatinine 1.1, proBNP 36 Labs (7/13): LDL 53, HDL 51 Labs (3/14): LDL 73, HDL 48, K 4.3, creatinine 1.2, BNP 39 Labs (1/15): LDL 56, HDL 36, K 4.6, creatinine 1.5 Labs (8/15): K 4.1, creatinine 1.4  PMH: 1. CVA: MRI 2/14 with  multiple chronic infarcts.  CTA neck showed occluded RICA, LICA without significant disease.  30 day event monitor in 3/14 did not show atrial fibrillation.  2. HTN 3. Right lower leg DVT in 2003 4. Obesity 5. CAD: NSTEMI (8/12).  LHC with total occlusion LAD and collaterals from RCA, 90% serial CFX stenoses.  Patient was evaluated for CABG but was turned down by CVTS given obesity and poor functional status.  He had DES to CFX placed instead.   6. Diastolic CHF: Echo (5/02) with EF 45-50%, anteroseptal hypokinesis, mild LV hypertrophy, normal RV, aortic sclerosis.  Echo (3/13) with EF 50-55%, mild AS. Echo (8/15) with EF 55-60%, mild LVH, mild AS.  7. Hyperlipidemia 8. Gout 9. Seizure disorder 10. Bladder calculi 11. BPH 12. Aortic stenosis: Mild on 8/15 echo.  13. GERD 14. 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  15. Carotid stenosis: Carotid dopplers (2/15) with totally occluded RICA. Carotid dopplers (2/16) with occluded RICA, 7-74% LICA.  16. Depression 17. Peripheral neuropathy  SH: Lives in [place], married.  1 living son.  Prior smoker.   FH: 2 sons died of MIs in their 90s.  Another son committed suicide.    ROS: All systems reviewed and negative except as per HPI.    Current Outpatient Prescriptions  Medication  Sig Dispense Refill  . aspirin 81 MG tablet Take 81 mg by mouth daily.      Marland Kitchen atorvastatin (LIPITOR) 80 MG tablet Take 1 tablet by mouth  daily 90 tablet 3  . clopidogrel (PLAVIX) 75 MG tablet Take 1 tablet by mouth  daily 90 tablet 1  . gabapentin (NEURONTIN) 300 MG capsule Take 1 capsule by mouth 3  times daily (Patient taking differently: TAKE 2 TABLETS IN THE AM AND 2 TABLETS IN THE PM BY MOUTH DAILY.) 270 capsule 0  . isosorbide mononitrate (IMDUR) 30 MG 24 hr tablet Take 1 tablet by mouth  daily 90 tablet 3  . metoprolol succinate (TOPROL-XL) 50 MG 24 hr tablet Take 50 mg by mouth daily. Take with or immediately following a meal.    . Multiple  Vitamins-Minerals (EYE VITAMINS) CAPS Take 1 capsule by mouth daily.      . nitroGLYCERIN (NITROSTAT) 0.4 MG SL tablet Place 1 tablet (0.4 mg total) under the tongue every 5 (five) minutes as needed. 25 tablet prn  . pantoprazole (PROTONIX) 40 MG tablet TAKE 1 TABLET BY MOUTH ONCE DAILY 30 tablet 11  . Tamsulosin HCl (FLOMAX) 0.4 MG CAPS Take 0.4 mg by mouth daily.    Marland Kitchen ZETIA 10 MG tablet Take 1 tablet by mouth  daily 90 tablet 1  . citalopram (CELEXA) 20 MG tablet Take 1 tablet (20 mg total) by mouth daily. 30 tablet 4  . furosemide (LASIX) 40 MG tablet 1 tablet (40mg ) by mouth in the AM and 1/2 tablet (20mg ) by mouth about 3-4 PM. 45 tablet 4   No current facility-administered medications for this visit.    BP 122/74 mmHg  Pulse 55  Ht 6\' 2"  (1.88 m)  Wt 368 lb (166.924 kg)  BMI 47.23 kg/m2 General: Obese, NAD Neck: Thick neck, JVP difficult, no thyromegaly or thyroid nodule.  Lungs: Clear to auscultation bilaterally with normal respiratory effort. CV: Nondisplaced PMI.  Heart regular S1/S2, no S3/S4, 2/6 SEM RUSB.  1+ ankle edema.  Soft right carotid bruit.  Normal pedal pulses.  Abdomen: Soft, nontender, no hepatosplenomegaly, no distention.   Neurologic: Alert and oriented x 3.  Psych: Normal affect. Extremities: No clubbing or cyanosis.   Assessment/Plan: CAD  S/p PCI to CFX. Also has chronic total occlusion of LAD with collaterals from the RCA. Patient was seen by CVTS and deemed a poor CABG candidate due to obesity and poor functional status. No recent chest pain.  EF normal on last echo. - Continue ASA 81, Plavix, Imdur, Toprol XL, ACEI, and statin.  Diastolic CHF, chronic NYHA class IV symptoms with worsening dyspnea.  Exam is difficult for volume.  It is hard to know how much of this is due to obesity and deconditioning and how much is due to fluid overload.  I will have him increase Lasix to 40 qam, 20 qpm.  Check BMET in 2 wks.  Morbid obesity  Very inactive and eats what  he wants. He seems to lack much motivation to change his lifestyle.  I suspect that there is a major component of depression.   Hyperlipidemia Check lipids today.  Continue statin, Zetia.  CVA Suspect CVA noted on MRI in 2/14 was due to carotid disease.  He has an occluded RICA.  Minimal LICA disease.  He is on ASA and Plavix.  No sign of atrial fibrillation on 30 day monitor in 3/14.  Depression Strongly suspected.  I will start him on Celexa 20 mg daily.  Aortic stenosis Mild aortic stenosis.   Loralie Champagne 04/21/2014

## 2014-04-22 NOTE — Addendum Note (Signed)
Addended by: Katrine Coho on: 04/22/2014 08:02 AM   Modules accepted: Orders

## 2014-05-05 ENCOUNTER — Other Ambulatory Visit (INDEPENDENT_AMBULATORY_CARE_PROVIDER_SITE_OTHER): Payer: Medicare Other | Admitting: *Deleted

## 2014-05-05 DIAGNOSIS — I5032 Chronic diastolic (congestive) heart failure: Secondary | ICD-10-CM

## 2014-05-05 DIAGNOSIS — I2510 Atherosclerotic heart disease of native coronary artery without angina pectoris: Secondary | ICD-10-CM

## 2014-05-05 LAB — BASIC METABOLIC PANEL
BUN: 21 mg/dL (ref 6–23)
CALCIUM: 9.4 mg/dL (ref 8.4–10.5)
CO2: 34 mEq/L — ABNORMAL HIGH (ref 19–32)
Chloride: 102 mEq/L (ref 96–112)
Creatinine, Ser: 1.43 mg/dL (ref 0.40–1.50)
GFR: 50.85 mL/min — ABNORMAL LOW (ref >60.00)
GLUCOSE: 106 mg/dL — AB (ref 70–99)
Potassium: 3.6 mEq/L (ref 3.5–5.1)
Sodium: 142 mEq/L (ref 135–145)

## 2014-06-10 ENCOUNTER — Other Ambulatory Visit: Payer: Self-pay | Admitting: Neurology

## 2014-06-21 ENCOUNTER — Ambulatory Visit (INDEPENDENT_AMBULATORY_CARE_PROVIDER_SITE_OTHER): Payer: Medicare Other | Admitting: Neurology

## 2014-06-21 ENCOUNTER — Encounter: Payer: Self-pay | Admitting: Neurology

## 2014-06-21 VITALS — BP 109/63 | HR 57

## 2014-06-21 DIAGNOSIS — R269 Unspecified abnormalities of gait and mobility: Secondary | ICD-10-CM

## 2014-06-21 MED ORDER — GABAPENTIN 300 MG PO CAPS: 600.0000 mg | ORAL_CAPSULE | Freq: Three times a day (TID) | ORAL | Status: DC

## 2014-06-21 NOTE — Progress Notes (Signed)
PATIENT: Benjamin Kaiser DOB: 23-Jul-1934  REASON FOR VISIT: routine follow up for peripheral neuropathy  HISTORY FROM: patient and wife  HISTORY OF PRESENT ILLNESS: Benjamin Kaiser, 80 year old male returns for followup. He was last seen in this office 04/08/13 with Cecille Rubin, NP. He has a history of peripheral neuropathy and generalized weakness for several years. He has a long-standing history of Dilantin use for a single isolated seizure. This was titrated off as Dilantin can can cause a gait disorder. He is short of breath with exertion, likely due to his morbid obesity, deconditioning and history of diastolic heart failure.  He has continued to gain weight, is approximately 360 lbs now. He is currently on gabapentin but only takes 300 in the am and 600 in the pm. He does not watch his diet and seems to lack insight and/or motivation to make any changes to loose weight. He does snore at night, but he does not want to do a sleep study. He has fallen recently, and wife had to call EMS for lifting assistance. He uses a quad cane but cannot ambulate more than 15 ft without exhaustion. He returns for reevaluation. He has not had further stroke or TIA symptoms.  HISTORY: He has had generalized weakness for several years. Long-standing use of Dilantin since the 90s following a solitary seizure. Last level of Dilantin on 05/01/12 was 5.7, Dr. [name] had decreased dose at his last visit in hopes of getting him off of the medication altogether as it can also cause gait disorders. He complains of a burning pain in his feet more troublesome at night. He does have a history of alcohol abuse. He denies any falls since last seen, seated in a wheelchair today and ambulates with a quad cane. He is short of breath with exertion greater than 25 feet, history of chronic back pain as well. EMG nerve conduction shows severe peripheral neuropathy. He was placed on gabapentin at his last visit which has helped his symptoms. He  successfully tapered his Dilantin and has not had further seizure events. He had normal B12 level. Serum electrophoresis was normal  Update 06/21/2014 : He returns for follow-up after last visit 6 months ago. Is a complaint by his wife. He continues to ambulate very minimally and can barely get up and use a quad cane to walk to the restroom. He does not walk outside his house. He is had a couple of minor falls. He was unable to get up himself and wife could not help him and had to call EMS. He tried using a walker which was prescribed at last visit but did not work well with him. He takes Gabapentin 600 twice daily but does complain of pain in his leg during the afternoon a lot. He spends most of his time sitting in a bed or chair. REVIEW OF SYSTEMS: Full 14 system review of systems performed and notable only for: Fatigue, eye discharge, itching, redness, shortness of breath, constipation, diarrhea, agitation, confusion, daytime sleepiness, sleep talking, gait difficulty and all other systems negative ALLERGIES: Allergies  Allergen Reactions  . Niacin And Related     Occasional flushing, itching    HOME MEDICATIONS: Outpatient Prescriptions Prior to Visit  Medication Sig Dispense Refill  . aspirin 81 MG tablet Take 81 mg by mouth daily.      Marland Kitchen atorvastatin (LIPITOR) 80 MG tablet Take 1 tablet by mouth  daily 90 tablet 2  . citalopram (CELEXA) 20 MG tablet Take 1 tablet (  20 mg total) by mouth daily. 30 tablet 4  . clopidogrel (PLAVIX) 75 MG tablet Take 1 tablet by mouth  daily 90 tablet 1  . furosemide (LASIX) 40 MG tablet 1 tablet (40mg ) by mouth in the AM and 1/2 tablet (20mg ) by mouth about 3-4 PM. 45 tablet 4  . isosorbide mononitrate (IMDUR) 30 MG 24 hr tablet Take 1 tablet by mouth  daily 90 tablet 2  . metoprolol succinate (TOPROL-XL) 50 MG 24 hr tablet Take 50 mg by mouth daily. Take with or immediately following a meal.    . Multiple Vitamins-Minerals (EYE VITAMINS) CAPS Take 1 capsule by  mouth daily.      . nitroGLYCERIN (NITROSTAT) 0.4 MG SL tablet Place 1 tablet (0.4 mg total) under the tongue every 5 (five) minutes as needed. 25 tablet prn  . pantoprazole (PROTONIX) 40 MG tablet TAKE 1 TABLET BY MOUTH ONCE DAILY 30 tablet 11  . Tamsulosin HCl (FLOMAX) 0.4 MG CAPS Take 0.4 mg by mouth daily.    Marland Kitchen ZETIA 10 MG tablet Take 1 tablet by mouth  daily 90 tablet 1  . gabapentin (NEURONTIN) 300 MG capsule Take 1 capsule by mouth 3  times daily 270 capsule 0   No facility-administered medications prior to visit.    PHYSICAL EXAM Filed Vitals:   06/21/14 1040  BP: 109/63  Pulse: 57    Generalized: Well developed, morbidly obese male in no acute distress  Head: normocephalic and atraumatic, Oropharynx benign  Neck: Supple, Neck size 20 inches  Cardiac: Regular rate rhythm, 2/6 systolic murmur, Soft right carotid bruit Musculoskeletal: No deformity  Skin 1 + ankle edema   Neurological examination  Mentation: Alert oriented to time, place, history taking. Follows all commands speech and language fluent.   Cranial nerve II-XII: Pupils were equal round reactive to light extraocular movements were full, visual field were full on confrontational test. Facial sensation and strength were normal. Very HOH. Uvula tongue midline. Head turning and shoulder shrug were normal and symmetric.Tongue protrusion into cheek strength was normal.  Motor: normal bulk and tone, full strength in the BUE, BLE, No focal weakness except mild ankle dorsiflexor weakness bilaterally Sensory: Diminished pinprick to mid shin bilaterally diminished vibratory to ankles. Touch sensation is normal  Coordination: finger-nose-finger, no dysmetria, not able to perform heel to shin due to girth Reflexes: Brachioradialis 2/2, biceps 2/2, triceps 2/2, patellar 2/2, Achilles absent, plantar responses were flexor bilaterally.  Gait and Station: not tested, in wheelchair   ASSESSMENT: 80 y.o. year old male has a past  medical history of Hypertension; Seizure disorder; Hyperlipidemia; Bradycardia; ; Obesity; DVT (deep venous thrombosis) (2003); CAD (coronary artery disease); Chronic diastolic heart failure; Aortic stenosis; Neuropathy; and Stroke here to followup for peripheral neuropathy. He is increasingly less ambulatory, and functional status is poor. Marland Kitchen  PLAN: I had a long discussion with the patient and his wife regarding his chronic gait difficulties, lower extremity paresthesias from  neuropathy and answered questions. I recommend increasing the gabapentin to 600 mg 3 times daily as tolerated to help with his leg pain. He was also counseled to use a quad cane at all times while walking and fall precautions.  Return for follow-up in 6 months with [facility] practitioner or call earlier if necessary  No orders of the defined types were placed in this encounter.   Return in about 6 months (around 12/21/2014).  Antony Contras, MD  06/21/2014, 12:57 PM [facility] [address]  [place], [place] [address] [phone]  Note: This document was prepared with digital dictation and possible smart phrase technology. Any transcriptional errors that result from this process are unintentional.

## 2014-06-21 NOTE — Patient Instructions (Signed)
I had a long discussion with the patient and his wife regarding his chronic gait difficulties, lower extremity paresthesias from  neuropathy and answered questions. I recommend increasing the gabapentin to 600 mg 3 times daily as tolerated to help with his leg pain. He was also counseled to use a quad cane at all times while walking and fall precautions.  Return for follow-up in 6 months with [facility] practitioner or call earlier if necessary  Fall Prevention and Home Safety Falls cause injuries and can affect all age groups. It is possible to use preventive measures to significantly decrease the likelihood of falls. There are many simple measures which can make your home safer and prevent falls. OUTDOORS  Repair cracks and edges of walkways and driveways.  Remove high doorway thresholds.  Trim shrubbery on the main path into your home.  Have good outside lighting.  Clear walkways of tools, rocks, debris, and clutter.  Check that handrails are not broken and are securely fastened. Both sides of steps should have handrails.  Have leaves, snow, and ice cleared regularly.  Use sand or salt on walkways during winter months.  In the garage, clean up grease or oil spills. BATHROOM  Install night lights.  Install grab bars by the toilet and in the tub and shower.  Use non-skid mats or decals in the tub or shower.  Place a plastic non-slip stool in the shower to sit on, if needed.  Keep floors dry and clean up all water on the floor immediately.  Remove soap buildup in the tub or shower on a regular basis.  Secure bath mats with non-slip, double-sided rug tape.  Remove throw rugs and tripping hazards from the floors. BEDROOMS  Install night lights.  Make sure a bedside light is easy to reach.  Do not use oversized bedding.  Keep a telephone by your bedside.  Have a firm chair with side arms to use for getting dressed.  Remove throw rugs and tripping hazards from the  floor. KITCHEN  Keep handles on pots and pans turned toward the center of the stove. Use back burners when possible.  Clean up spills quickly and allow time for drying.  Avoid walking on wet floors.  Avoid hot utensils and knives.  Position shelves so they are not too high or low.  Place commonly used objects within easy reach.  If necessary, use a sturdy step stool with a grab bar when reaching.  Keep electrical cables out of the way.  Do not use floor polish or wax that makes floors slippery. If you must use wax, use non-skid floor wax.  Remove throw rugs and tripping hazards from the floor. STAIRWAYS  Never leave objects on stairs.  Place handrails on both sides of stairways and use them. Fix any loose handrails. Make sure handrails on both sides of the stairways are as long as the stairs.  Check carpeting to make sure it is firmly attached along stairs. Make repairs to worn or loose carpet promptly.  Avoid placing throw rugs at the top or bottom of stairways, or properly secure the rug with carpet tape to prevent slippage. Get rid of throw rugs, if possible.  Have an electrician put in a light switch at the top and bottom of the stairs. OTHER FALL PREVENTION TIPS  Wear low-heel or rubber-soled shoes that are supportive and fit well. Wear closed toe shoes.  When using a stepladder, make sure it is fully opened and both spreaders are firmly locked.  Do not climb a closed stepladder.  Add color or contrast paint or tape to grab bars and handrails in your home. Place contrasting color strips on first and last steps.  Learn and use mobility aids as needed. Install an electrical emergency response system.  Turn on lights to avoid dark areas. Replace light bulbs that burn out immediately. Get light switches that glow.  Arrange furniture to create clear pathways. Keep furniture in the same place.  Firmly attach carpet with non-skid or double-sided tape.  Eliminate uneven  floor surfaces.  Select a carpet pattern that does not visually hide the edge of steps.  Be aware of all pets. OTHER HOME SAFETY TIPS  Set the water temperature for 120 F (48.8 C).  Keep emergency numbers on or near the telephone.  Keep smoke detectors on every level of the home and near sleeping areas. Document Released: 02/02/2002 Document Revised: 08/14/2011 Document Reviewed: 05/04/2011 [facility] Patient Information 2015 [place], [place]. This information is not intended to replace advice given to you by your health care provider. Make sure you discuss any questions you have with your health care provider.

## 2014-08-16 ENCOUNTER — Other Ambulatory Visit: Payer: Self-pay | Admitting: Neurology

## 2014-08-16 ENCOUNTER — Other Ambulatory Visit: Payer: Self-pay | Admitting: Cardiology

## 2014-08-20 ENCOUNTER — Ambulatory Visit: Payer: Medicare Other | Admitting: Cardiology

## 2014-10-20 ENCOUNTER — Other Ambulatory Visit: Payer: Self-pay | Admitting: Cardiology

## 2014-10-27 ENCOUNTER — Ambulatory Visit: Payer: Medicare Other | Admitting: Cardiology

## 2014-12-16 ENCOUNTER — Telehealth: Payer: Self-pay | Admitting: Neurology

## 2014-12-16 NOTE — Telephone Encounter (Signed)
I spoke to patient`s wife who informed me that unfortunately patient's condition is deteriorated and is now bedridden doesn't get out. She is also getting confused and disoriented. Patient's primary care physician has advised palliative care. Hence I agree I do not see any reason for patient to be transported for his scheduled 6 monthly follow-up appointment with me next week advised to cancel the appointment.

## 2014-12-16 NOTE — Telephone Encounter (Signed)
Pt's wife called and says there has been a significant change in her husband. He is currently bed ridden and is very confused. The wife would like a call back about pts appt on 10/25. She will need to arrange transportation if so. Pleas call and advise [phone] [facility]  Is currently seeing the pt. PT is coming to see the pt as well. Pt is very weak.

## 2014-12-16 NOTE — Telephone Encounter (Signed)
Rn call patents wife back concerning her husband condition. Her name is Benjamin Kaiser and her number is [phone]. Ptw wife stated her husband is bedridden,and gets confuse at times. He does not walk anymore. Pt does receive Pt from [facility]. Wife stated her husband has not been out of the house in the last couple of months. Benjamin Kaiser did not know if Dr. [name] needed to adjust his medications at the appt on 12-21-14. Pts PCP is involve in his care too. Pt is currently in a hospital bed. Pts wife stated she would have to call the transportation at least two days in advance prior to the appointment. Rn stated Dr.Sethi will be sent a urgent message because the appt is on 12-21-14 Tuesday.

## 2014-12-21 ENCOUNTER — Ambulatory Visit: Payer: Medicare Other | Admitting: Neurology

## 2014-12-24 ENCOUNTER — Other Ambulatory Visit: Payer: Self-pay | Admitting: *Deleted

## 2014-12-24 MED ORDER — PANTOPRAZOLE SODIUM 40 MG PO TBEC: 40.0000 mg | DELAYED_RELEASE_TABLET | Freq: Every day | ORAL | Status: DC

## 2014-12-30 ENCOUNTER — Emergency Department (HOSPITAL_COMMUNITY): Payer: Medicare Other

## 2014-12-30 ENCOUNTER — Encounter (HOSPITAL_COMMUNITY): Payer: Self-pay | Admitting: Emergency Medicine

## 2014-12-30 ENCOUNTER — Inpatient Hospital Stay (HOSPITAL_COMMUNITY)
Admission: EM | Admit: 2014-12-30 | Discharge: 2015-01-07 | DRG: 205 | Disposition: A | Discharge status: Home Health | Payer: Medicare Other | Attending: Internal Medicine | Admitting: Internal Medicine

## 2014-12-30 DIAGNOSIS — R532 Functional quadriplegia: Secondary | ICD-10-CM | POA: Diagnosis present

## 2014-12-30 DIAGNOSIS — N183 Chronic kidney disease, stage 3 unspecified: Secondary | ICD-10-CM | POA: Diagnosis present

## 2014-12-30 DIAGNOSIS — R0902 Hypoxemia: Secondary | ICD-10-CM | POA: Diagnosis not present

## 2014-12-30 DIAGNOSIS — L89319 Pressure ulcer of right buttock, unspecified stage: Secondary | ICD-10-CM | POA: Diagnosis present

## 2014-12-30 DIAGNOSIS — Z801 Family history of malignant neoplasm of trachea, bronchus and lung: Secondary | ICD-10-CM

## 2014-12-30 DIAGNOSIS — Z720 Tobacco use: Secondary | ICD-10-CM

## 2014-12-30 DIAGNOSIS — I639 Cerebral infarction, unspecified: Secondary | ICD-10-CM | POA: Diagnosis present

## 2014-12-30 DIAGNOSIS — I2510 Atherosclerotic heart disease of native coronary artery without angina pectoris: Secondary | ICD-10-CM | POA: Diagnosis present

## 2014-12-30 DIAGNOSIS — Z6841 Body Mass Index (BMI) 40.0 and over, adult: Secondary | ICD-10-CM

## 2014-12-30 DIAGNOSIS — E785 Hyperlipidemia, unspecified: Secondary | ICD-10-CM | POA: Diagnosis present

## 2014-12-30 DIAGNOSIS — I10 Essential (primary) hypertension: Secondary | ICD-10-CM | POA: Diagnosis present

## 2014-12-30 DIAGNOSIS — N400 Enlarged prostate without lower urinary tract symptoms: Secondary | ICD-10-CM | POA: Diagnosis present

## 2014-12-30 DIAGNOSIS — M109 Gout, unspecified: Secondary | ICD-10-CM | POA: Diagnosis present

## 2014-12-30 DIAGNOSIS — G40909 Epilepsy, unspecified, not intractable, without status epilepticus: Secondary | ICD-10-CM | POA: Diagnosis present

## 2014-12-30 DIAGNOSIS — R0689 Other abnormalities of breathing: Secondary | ICD-10-CM | POA: Diagnosis present

## 2014-12-30 DIAGNOSIS — I69354 Hemiplegia and hemiparesis following cerebral infarction affecting left non-dominant side: Secondary | ICD-10-CM

## 2014-12-30 DIAGNOSIS — R410 Disorientation, unspecified: Secondary | ICD-10-CM

## 2014-12-30 DIAGNOSIS — I350 Nonrheumatic aortic (valve) stenosis: Secondary | ICD-10-CM

## 2014-12-30 DIAGNOSIS — L8990 Pressure ulcer of unspecified site, unspecified stage: Secondary | ICD-10-CM | POA: Diagnosis present

## 2014-12-30 DIAGNOSIS — Z7982 Long term (current) use of aspirin: Secondary | ICD-10-CM

## 2014-12-30 DIAGNOSIS — I130 Hypertensive heart and chronic kidney disease with heart failure and stage 1 through stage 4 chronic kidney disease, or unspecified chronic kidney disease: Secondary | ICD-10-CM | POA: Diagnosis present

## 2014-12-30 DIAGNOSIS — Z823 Family history of stroke: Secondary | ICD-10-CM

## 2014-12-30 DIAGNOSIS — I252 Old myocardial infarction: Secondary | ICD-10-CM

## 2014-12-30 DIAGNOSIS — Z8249 Family history of ischemic heart disease and other diseases of the circulatory system: Secondary | ICD-10-CM

## 2014-12-30 DIAGNOSIS — Z791 Long term (current) use of non-steroidal anti-inflammatories (NSAID): Secondary | ICD-10-CM

## 2014-12-30 DIAGNOSIS — G9340 Encephalopathy, unspecified: Secondary | ICD-10-CM | POA: Diagnosis present

## 2014-12-30 DIAGNOSIS — J9620 Acute and chronic respiratory failure, unspecified whether with hypoxia or hypercapnia: Secondary | ICD-10-CM | POA: Diagnosis present

## 2014-12-30 DIAGNOSIS — E662 Morbid (severe) obesity with alveolar hypoventilation: Principal | ICD-10-CM | POA: Diagnosis present

## 2014-12-30 DIAGNOSIS — G629 Polyneuropathy, unspecified: Secondary | ICD-10-CM | POA: Diagnosis present

## 2014-12-30 DIAGNOSIS — K219 Gastro-esophageal reflux disease without esophagitis: Secondary | ICD-10-CM | POA: Diagnosis present

## 2014-12-30 DIAGNOSIS — L89329 Pressure ulcer of left buttock, unspecified stage: Secondary | ICD-10-CM | POA: Diagnosis present

## 2014-12-30 DIAGNOSIS — Z79899 Other long term (current) drug therapy: Secondary | ICD-10-CM

## 2014-12-30 DIAGNOSIS — E876 Hypokalemia: Secondary | ICD-10-CM | POA: Diagnosis present

## 2014-12-30 DIAGNOSIS — Z86718 Personal history of other venous thrombosis and embolism: Secondary | ICD-10-CM

## 2014-12-30 DIAGNOSIS — G609 Hereditary and idiopathic neuropathy, unspecified: Secondary | ICD-10-CM | POA: Diagnosis present

## 2014-12-30 DIAGNOSIS — J9622 Acute and chronic respiratory failure with hypercapnia: Secondary | ICD-10-CM | POA: Diagnosis present

## 2014-12-30 DIAGNOSIS — R4182 Altered mental status, unspecified: Secondary | ICD-10-CM | POA: Diagnosis not present

## 2014-12-30 DIAGNOSIS — J9621 Acute and chronic respiratory failure with hypoxia: Secondary | ICD-10-CM | POA: Diagnosis present

## 2014-12-30 DIAGNOSIS — Z9079 Acquired absence of other genital organ(s): Secondary | ICD-10-CM

## 2014-12-30 DIAGNOSIS — Z7902 Long term (current) use of antithrombotics/antiplatelets: Secondary | ICD-10-CM

## 2014-12-30 DIAGNOSIS — I5032 Chronic diastolic (congestive) heart failure: Secondary | ICD-10-CM | POA: Diagnosis present

## 2014-12-30 DIAGNOSIS — I2582 Chronic total occlusion of coronary artery: Secondary | ICD-10-CM | POA: Diagnosis present

## 2014-12-30 DIAGNOSIS — H9190 Unspecified hearing loss, unspecified ear: Secondary | ICD-10-CM | POA: Diagnosis present

## 2014-12-30 DIAGNOSIS — Z888 Allergy status to other drugs, medicaments and biological substances status: Secondary | ICD-10-CM

## 2014-12-30 HISTORY — DX: Chronic kidney disease, stage 3 (moderate): N18.3

## 2014-12-30 LAB — COMPREHENSIVE METABOLIC PANEL
ALK PHOS: 58 U/L (ref 38–126)
ALT: 12 U/L — AB (ref 17–63)
AST: 15 U/L (ref 15–41)
Albumin: 3 g/dL — ABNORMAL LOW (ref 3.5–5.0)
Anion gap: 7 (ref 5–15)
BUN: 20 mg/dL (ref 6–20)
CALCIUM: 8.8 mg/dL — AB (ref 8.9–10.3)
CO2: 34 mmol/L — AB (ref 22–32)
Chloride: 102 mmol/L (ref 101–111)
Creatinine, Ser: 1.43 mg/dL — ABNORMAL HIGH (ref 0.61–1.24)
GFR, EST AFRICAN AMERICAN: 53 mL/min — AB (ref >60)
GFR, EST NON AFRICAN AMERICAN: 45 mL/min — AB (ref >60)
Glucose, Bld: 116 mg/dL — ABNORMAL HIGH (ref 65–99)
Potassium: 3.3 mmol/L — ABNORMAL LOW (ref 3.5–5.1)
Sodium: 143 mmol/L (ref 135–145)
Total Bilirubin: 0.7 mg/dL (ref 0.3–1.2)
Total Protein: 6.7 g/dL (ref 6.5–8.1)

## 2014-12-30 LAB — CBC WITH DIFFERENTIAL/PLATELET
Basophils Absolute: 0 10*3/uL (ref 0.0–0.1)
Basophils Relative: 0 %
Eosinophils Absolute: 0.4 10*3/uL (ref 0.0–0.7)
Eosinophils Relative: 4 %
HCT: 43.6 % (ref 39.0–52.0)
HEMOGLOBIN: 13.9 g/dL (ref 13.0–17.0)
LYMPHS ABS: 1.5 10*3/uL (ref 0.7–4.0)
LYMPHS PCT: 12 %
MCH: 27.9 pg (ref 26.0–34.0)
MCHC: 31.9 g/dL (ref 30.0–36.0)
MCV: 87.6 fL (ref 78.0–100.0)
Monocytes Absolute: 1 10*3/uL (ref 0.1–1.0)
Monocytes Relative: 8 %
NEUTROS PCT: 76 %
Neutro Abs: 9.1 10*3/uL — ABNORMAL HIGH (ref 1.7–7.7)
Platelets: 264 10*3/uL (ref 150–400)
RBC: 4.98 MIL/uL (ref 4.22–5.81)
RDW: 15.5 % (ref 11.5–15.5)
WBC: 12.1 10*3/uL — AB (ref 4.0–10.5)

## 2014-12-30 LAB — URINALYSIS, ROUTINE W REFLEX MICROSCOPIC
Bilirubin Urine: NEGATIVE
GLUCOSE, UA: NEGATIVE mg/dL
HGB URINE DIPSTICK: NEGATIVE
Ketones, ur: NEGATIVE mg/dL
Leukocytes, UA: NEGATIVE
Nitrite: NEGATIVE
PH: 5.5 (ref 5.0–8.0)
PROTEIN: NEGATIVE mg/dL
Specific Gravity, Urine: 1.013 (ref 1.005–1.030)
Urobilinogen, UA: 1 mg/dL (ref 0.0–1.0)

## 2014-12-30 LAB — TROPONIN I: Troponin I: <0.03 ng/mL (ref <0.031)

## 2014-12-30 MED ORDER — ACETAMINOPHEN 325 MG PO TABS
650.0000 mg | ORAL_TABLET | Freq: Four times a day (QID) | ORAL | Status: DC | PRN
Start: 2014-12-30 — End: 2015-01-07
  Administered 2014-12-31 – 2015-01-06 (×6): 650 mg via ORAL
  Filled 2014-12-30 (×6): qty 2

## 2014-12-30 MED ORDER — ASPIRIN EC 81 MG PO TBEC
81.0000 mg | DELAYED_RELEASE_TABLET | Freq: Every day | ORAL | Status: DC
  Administered 2014-12-31 – 2015-01-07 (×8): 81 mg via ORAL
  Filled 2014-12-30 (×8): qty 1

## 2014-12-30 MED ORDER — ISOSORBIDE MONONITRATE ER 30 MG PO TB24
30.0000 mg | ORAL_TABLET | Freq: Every day | ORAL | Status: DC
  Administered 2014-12-31 – 2015-01-07 (×8): 30 mg via ORAL
  Filled 2014-12-30 (×8): qty 1

## 2014-12-30 MED ORDER — VITAMINS A & D EX OINT: 1.0000 "application " | TOPICAL_OINTMENT | CUTANEOUS | Status: DC | PRN

## 2014-12-30 MED ORDER — FUROSEMIDE 40 MG PO TABS
40.0000 mg | ORAL_TABLET | Freq: Every day | ORAL | Status: DC
  Administered 2014-12-31 – 2015-01-07 (×8): 40 mg via ORAL
  Filled 2014-12-30 (×8): qty 1

## 2014-12-30 MED ORDER — POLYVINYL ALCOHOL 1.4 % OP SOLN
1.0000 [drp] | Freq: Every evening | OPHTHALMIC | Status: DC | PRN
  Administered 2015-01-02 – 2015-01-03 (×2): 1 [drp] via OPHTHALMIC
  Filled 2014-12-30: qty 15

## 2014-12-30 MED ORDER — TAMSULOSIN HCL 0.4 MG PO CAPS
0.4000 mg | ORAL_CAPSULE | Freq: Every day | ORAL | Status: DC
  Administered 2014-12-31 – 2015-01-07 (×8): 0.4 mg via ORAL
  Filled 2014-12-30 (×8): qty 1

## 2014-12-30 MED ORDER — OCUVITE-LUTEIN PO CAPS
1.0000 | ORAL_CAPSULE | Freq: Every day | ORAL | Status: DC
  Administered 2014-12-31: 1 via ORAL
  Filled 2014-12-30 (×2): qty 1

## 2014-12-30 MED ORDER — POTASSIUM CHLORIDE CRYS ER 20 MEQ PO TBCR
40.0000 meq | EXTENDED_RELEASE_TABLET | Freq: Once | ORAL | Status: DC
  Filled 2014-12-30 (×5): qty 2

## 2014-12-30 MED ORDER — POTASSIUM CHLORIDE 20 MEQ PO PACK: 20.0000 meq | PACK | Freq: Two times a day (BID) | ORAL | Status: DC

## 2014-12-30 MED ORDER — POTASSIUM CHLORIDE CRYS ER 20 MEQ PO TBCR
20.0000 meq | EXTENDED_RELEASE_TABLET | Freq: Two times a day (BID) | ORAL | Status: DC
  Administered 2014-12-30 – 2015-01-07 (×16): 20 meq via ORAL
  Filled 2014-12-30 (×14): qty 1

## 2014-12-30 MED ORDER — PROPYLENE GLYCOL 0.6 % OP SOLN: 1.0000 [drp] | Freq: Every evening | OPHTHALMIC | Status: DC | PRN

## 2014-12-30 MED ORDER — NITROGLYCERIN 0.4 MG SL SUBL: 0.4000 mg | SUBLINGUAL_TABLET | SUBLINGUAL | Status: DC | PRN

## 2014-12-30 MED ORDER — KETOROLAC TROMETHAMINE 15 MG/ML IJ SOLN: 15.0000 mg | Freq: Once | INTRAMUSCULAR | Status: DC

## 2014-12-30 MED ORDER — IBUPROFEN 200 MG PO TABS: 400.0000 mg | ORAL_TABLET | Freq: Four times a day (QID) | ORAL | Status: DC | PRN

## 2014-12-30 MED ORDER — EZETIMIBE 10 MG PO TABS
10.0000 mg | ORAL_TABLET | Freq: Every day | ORAL | Status: DC
  Administered 2014-12-30 – 2015-01-07 (×9): 10 mg via ORAL
  Filled 2014-12-30 (×9): qty 1

## 2014-12-30 MED ORDER — SODIUM CHLORIDE 0.9 % IJ SOLN
3.0000 mL | Freq: Two times a day (BID) | INTRAMUSCULAR | Status: DC
  Administered 2014-12-30 – 2015-01-06 (×15): 3 mL via INTRAVENOUS

## 2014-12-30 MED ORDER — PANTOPRAZOLE SODIUM 40 MG PO TBEC
40.0000 mg | DELAYED_RELEASE_TABLET | Freq: Every day | ORAL | Status: DC
  Administered 2014-12-31 – 2015-01-07 (×8): 40 mg via ORAL
  Filled 2014-12-30 (×9): qty 1

## 2014-12-30 MED ORDER — EYE VITAMINS PO CAPS: 1.0000 | ORAL_CAPSULE | Freq: Every day | ORAL | Status: DC

## 2014-12-30 MED ORDER — DOCUSATE SODIUM 100 MG PO CAPS: 100.0000 mg | ORAL_CAPSULE | Freq: Two times a day (BID) | ORAL | Status: DC | PRN

## 2014-12-30 MED ORDER — BACITRACIN-NEOMYCIN-POLYMYXIN 400-5-5000 EX OINT: 1.0000 "application " | TOPICAL_OINTMENT | CUTANEOUS | Status: DC | PRN

## 2014-12-30 MED ORDER — ENOXAPARIN SODIUM 80 MG/0.8ML ~~LOC~~ SOLN
80.0000 mg | SUBCUTANEOUS | Status: DC
  Administered 2014-12-30 – 2015-01-05 (×7): 80 mg via SUBCUTANEOUS
  Filled 2014-12-30 (×7): qty 0.8

## 2014-12-30 MED ORDER — POTASSIUM CHLORIDE 20 MEQ PO PACK: 40.0000 meq | PACK | Freq: Once | ORAL | Status: DC

## 2014-12-30 MED ORDER — ATORVASTATIN CALCIUM 80 MG PO TABS
80.0000 mg | ORAL_TABLET | Freq: Every day | ORAL | Status: DC
  Administered 2014-12-30 – 2015-01-06 (×7): 80 mg via ORAL
  Filled 2014-12-30: qty 2
  Filled 2014-12-30 (×2): qty 1
  Filled 2014-12-30 (×6): qty 2

## 2014-12-30 MED ORDER — TRAZODONE HCL 50 MG PO TABS
50.0000 mg | ORAL_TABLET | Freq: Every evening | ORAL | Status: DC | PRN
  Administered 2015-01-03 (×2): 50 mg via ORAL
  Filled 2014-12-30: qty 1
  Filled 2014-12-30: qty 2
  Filled 2014-12-30: qty 1

## 2014-12-30 MED ORDER — CLOPIDOGREL BISULFATE 75 MG PO TABS
75.0000 mg | ORAL_TABLET | Freq: Every day | ORAL | Status: DC
  Administered 2014-12-31 – 2015-01-07 (×8): 75 mg via ORAL
  Filled 2014-12-30 (×8): qty 1

## 2014-12-30 MED ORDER — GABAPENTIN 300 MG PO CAPS
600.0000 mg | ORAL_CAPSULE | Freq: Three times a day (TID) | ORAL | Status: DC
  Administered 2014-12-30 – 2015-01-07 (×21): 600 mg via ORAL
  Filled 2014-12-30 (×22): qty 2

## 2014-12-30 MED ORDER — METOPROLOL SUCCINATE ER 25 MG PO TB24: 25.0000 mg | ORAL_TABLET | Freq: Every day | ORAL | Status: DC

## 2014-12-30 MED ORDER — ENOXAPARIN SODIUM 40 MG/0.4ML ~~LOC~~ SOLN: 40.0000 mg | SUBCUTANEOUS | Status: DC

## 2014-12-30 NOTE — Progress Notes (Addendum)
EDCM spoke to patient and his family at bedside.  Patient's wife Benjamin Kaiser [phone].  Patient lives at home with his wife.  Patient's wife reports patient has home health services with [facility] for RN, PT, aide.  Patient has a hospital bed, electric wheelchair, rolator, cane, trapeze, hoyer lift and shower bench, and bedside commode at home.  Patient's wife confirms patient's pcp is Dr. Arelia Sneddon.  EDCM asked patient and family if they had any further needs for assistance or dme at home?  Male family member at bedside reports that patient's wife needs assistance at home, "she is wore out."  [facility] provided patient's wife with list of private duty nursing agencies and explained it would be an out of pocket expense.  Patient noted to be wearing oxygen in the ED, patient does not wear oxygen at home.  Patient's family thankful for services.  No further EDCM needs at this time.

## 2014-12-30 NOTE — ED Provider Notes (Signed)
MSE was initiated and I personally evaluated the patient and placed orders (if any) at  3:16 PM on December 30, 2014.  The patient appears stable so that the remainder of the MSE may be completed by another provider.  Low oxygen level and increasing confusion over past several days. Home health recommended pt come to ER for further evaluation. Bedridden x 3 months.   Jola Schmidt, MD 12/30/14 [phone]

## 2014-12-30 NOTE — ED Provider Notes (Signed)
CSN: [id number]     Arrival date & time 12/30/14  1409 History   First MD Initiated Contact with Patient 12/30/14 1418     Chief Complaint  Patient presents with  . Altered Mental Status     (Consider location/radiation/quality/duration/timing/severity/associated sxs/prior Treatment) Patient is a 80 y.o. male presenting with altered mental status. The history is provided by the spouse and the EMS personnel. The history is limited by the condition of the patient.  Altered Mental Status  level V caveat applies due to patient's altered mental status. Patient is awake and alert but is confused. Patient has a history of several strokes in the past. With some left-sided residual weakness but patient does have some movement there. Patient was noted to be hypoxic by physical therapist. Oxygen saturations were in the mid to upper 80s. Patient's wife states sometimes will get as low as 82%. Patient is not on home oxygen. Patient does have physical therapy at home and they did call out a nurse to come out and evaluate him. Patient was placed on 4 L nasal cannula oxygen by EMS on the way in. He was originally placed on 100% nonrebreather and then adapted down to that. Patient's oxygen saturation saturations were in the mid 90s upon arrival. Patient appeared to be in no acute distress. There  Past Medical History  Diagnosis Date  . Hypertension   . Seizure disorder ([place])   . Hyperlipidemia   . Bradycardia     secondary to Atenolol  . Gout   . Obesity   . DVT (deep venous thrombosis) ([place]) 2003    Right lower extremity  . Bladder calculi     status cystoscopy  . BPH (benign prostatic hypertrophy)     status post TURP  . History of stroke   . CAD (coronary artery disease)     a. NSTEMI (8/12). LHC with total occlusion LAD and collaterals from RCA, 90% serial CFX stenoses. Patient was evaluated for CABG but was turned down by CVTS given obesity and poor functional status. He had DES to CFX placed  instead.   . Chronic diastolic heart failure ([place])     a. Echo (8/12) with EF 45-50%, anteroseptal hypokinesis, mild LV hypertrophy, normal RV, aortic sclerosis;  b. Echo (3/13) with EF 50-55%, mild AS.   Marland Kitchen Aortic stenosis     a. mild by echo 04/2011  . GERD (gastroesophageal reflux disease)   . Neuropathy ([place])   . Stroke [facility])    Past Surgical History  Procedure Laterality Date  . Transurethral resection of prostate    . Cataract surgery     Family History  Problem Relation Age of Onset  . Lung cancer Father   . Suicidality Son   . Stroke Son   . Heart attack Son   . Heart attack Son   . Fainting Brother    Social History  Substance Use Topics  . Smoking status: Never Smoker   . Smokeless tobacco: Former Systems developer    Types: [place] date: 02/26/1989  . Alcohol Use: No     Comment: Pt reports he use to drink alcohol    Review of Systems  Reason unable to perform ROS: A level V caveat applies due to the patient's altered mental status.      Allergies  Niacin and related  Home Medications   Prior to Admission medications   Medication Sig Start Date End Date Taking? Authorizing Provider  aspirin EC 81 MG  tablet Take 81 mg by mouth daily.   Yes Historical Provider, MD  atorvastatin (LIPITOR) 80 MG tablet Take 1 tablet by mouth  daily 10/21/14  Yes Larey Dresser, MD  clopidogrel (PLAVIX) 75 MG tablet Take 1 tablet by mouth  daily 08/17/14  Yes Larey Dresser, MD  docusate sodium (COLACE) 100 MG capsule Take 100 mg by mouth 2 (two) times daily as needed for mild constipation.   Yes Historical Provider, MD  furosemide (LASIX) 40 MG tablet Take 1 tablet by mouth  daily Patient taking differently: Take 1 tablet in the morning and 0.5 tablet at lunch time 08/17/14  Yes Larey Dresser, MD  gabapentin (NEURONTIN) 300 MG capsule Take 2 capsules (600 mg total) by mouth 3 (three) times daily. 08/16/14  Yes Garvin Fila, MD  ibuprofen (ADVIL,MOTRIN) 200 MG tablet Take 400 mg by  mouth every 6 (six) hours as needed for fever, headache, mild pain, moderate pain or cramping.   Yes Historical Provider, MD  influenza vac recombinant HA trivalent (FLUBLOK) injection Inject 0.5 mLs into the muscle once.   Yes Historical Provider, MD  isosorbide mononitrate (IMDUR) 30 MG 24 hr tablet Take 1 tablet by mouth  daily 04/22/14  Yes Larey Dresser, MD  metoprolol succinate (TOPROL-XL) 50 MG 24 hr tablet Take 1 tablet by mouth  daily . Take with or  immediately following a  meal. Patient taking differently: Take 0.5 tablet by mouth daily . Take with or  immediately following a  meal. 08/17/14  Yes Larey Dresser, MD  Multiple Vitamins-Minerals (EYE VITAMINS) CAPS Take 1 capsule by mouth daily.     Yes Historical Provider, MD  neomycin-bacitracin-polymyxin (NEOSPORIN) ointment Apply 1 application topically as needed for wound care.   Yes Historical Provider, MD  nitroGLYCERIN (NITROSTAT) 0.4 MG SL tablet Place 1 tablet (0.4 mg total) under the tongue every 5 (five) minutes as needed. 10/02/13  Yes Larey Dresser, MD  pantoprazole (PROTONIX) 40 MG tablet Take 1 tablet (40 mg total) by mouth daily. 12/24/14  Yes Larey Dresser, MD  Propylene Glycol (SYSTANE BALANCE) 0.6 % SOLN Place 1-2 drops into both eyes at bedtime as needed (for dry eyes).   Yes Historical Provider, MD  Tamsulosin HCl (FLOMAX) 0.4 MG CAPS Take 0.4 mg by mouth daily.   Yes Historical Provider, MD  traZODone (DESYREL) 150 MG tablet Take 50-150 mg by mouth at bedtime as needed for sleep.   Yes Historical Provider, MD  Vitamins A & D (VITAMIN A & D) ointment Apply 1 application topically as needed for dry skin.   Yes Historical Provider, MD  ZETIA 10 MG tablet Take 1 tablet by mouth  daily 08/17/14  Yes Larey Dresser, MD  citalopram (CELEXA) 20 MG tablet Take 1 tablet (20 mg total) by mouth daily. Patient not taking: Reported on 12/30/2014 04/21/14   Larey Dresser, MD   BP 134/79 mmHg  Pulse 63  Temp(Src) 98.1 F (36.7  C) (Oral)  Resp 16  SpO2 96% Physical Exam  Constitutional: He appears well-developed and well-nourished. No distress.  HENT:  Head: Normocephalic and atraumatic.  Mouth/Throat: Oropharynx is clear and moist.  Eyes: Conjunctivae and EOM are normal. Pupils are equal, round, and reactive to light.  Neck: Normal range of motion. Neck supple.  Cardiovascular: Normal rate, regular rhythm and normal heart sounds.   No murmur heard. Pulmonary/Chest: Effort normal and breath sounds normal. No respiratory distress. He has no wheezes.  Abdominal: Soft. Bowel sounds are normal.  Musculoskeletal: Normal range of motion. He exhibits edema.  Bilateral ankle and feet swelling. No leg swelling.  Neurological: He is alert. No cranial nerve deficit. He exhibits normal muscle tone. Coordination normal.  The patient is awake and alert but confused.  Skin: Skin is warm. No rash noted.  Nursing note and vitals reviewed.   ED Course  Procedures (including critical care time) Labs Review Labs Reviewed  CBC WITH DIFFERENTIAL/PLATELET - Abnormal; Notable for the following:    WBC 12.1 (*)    Neutro Abs 9.1 (*)    All other components within normal limits  COMPREHENSIVE METABOLIC PANEL - Abnormal; Notable for the following:    Potassium 3.3 (*)    CO2 34 (*)    Glucose, Bld 116 (*)    Creatinine, Ser 1.43 (*)    Calcium 8.8 (*)    Albumin 3.0 (*)    ALT 12 (*)    GFR calc non Af Amer 45 (*)    GFR calc Af Amer 53 (*)    All other components within normal limits  AMMONIA - Abnormal; Notable for the following:    Ammonia 38 (*)    All other components within normal limits  TROPONIN I  URINALYSIS, ROUTINE W REFLEX MICROSCOPIC (NOT AT [facility]  URINALYSIS, ROUTINE W REFLEX MICROSCOPIC (NOT AT [facility])   Results for orders placed or performed during the hospital encounter of 12/30/14  CBC with Differential/Platelet  Result Value Ref Range   WBC 12.1 (H) 4.0 - 10.5 K/uL   RBC 4.98 4.22 - 5.81 MIL/uL    Hemoglobin 13.9 13.0 - 17.0 g/dL   HCT 43.6 39.0 - 52.0 %   MCV 87.6 78.0 - 100.0 fL   MCH 27.9 26.0 - 34.0 pg   MCHC 31.9 30.0 - 36.0 g/dL   RDW 15.5 11.5 - 15.5 %   Platelets 264 150 - 400 K/uL   Neutrophils Relative % 76 %   Neutro Abs 9.1 (H) 1.7 - 7.7 K/uL   Lymphocytes Relative 12 %   Lymphs Abs 1.5 0.7 - 4.0 K/uL   Monocytes Relative 8 %   Monocytes Absolute 1.0 0.1 - 1.0 K/uL   Eosinophils Relative 4 %   Eosinophils Absolute 0.4 0.0 - 0.7 K/uL   Basophils Relative 0 %   Basophils Absolute 0.0 0.0 - 0.1 K/uL  Comprehensive metabolic panel  Result Value Ref Range   Sodium 143 135 - 145 mmol/L   Potassium 3.3 (L) 3.5 - 5.1 mmol/L   Chloride 102 101 - 111 mmol/L   CO2 34 (H) 22 - 32 mmol/L   Glucose, Bld 116 (H) 65 - 99 mg/dL   BUN 20 6 - 20 mg/dL   Creatinine, Ser 1.43 (H) 0.61 - 1.24 mg/dL   Calcium 8.8 (L) 8.9 - 10.3 mg/dL   Total Protein 6.7 6.5 - 8.1 g/dL   Albumin 3.0 (L) 3.5 - 5.0 g/dL   AST 15 15 - 41 U/L   ALT 12 (L) 17 - 63 U/L   Alkaline Phosphatase 58 38 - 126 U/L   Total Bilirubin 0.7 0.3 - 1.2 mg/dL   GFR calc non Af Amer 45 (L) >60 mL/min   GFR calc Af Amer 53 (L) >60 mL/min   Anion gap 7 5 - 15  Troponin I  Result Value Ref Range   Troponin I <0.03 <0.031 ng/mL  Ammonia  Result Value Ref Range   Ammonia 38 (H) 9 -  35 umol/L     Imaging Review Dg Chest 2 View  12/30/2014  CLINICAL DATA:  Hypoxia.  Altered mental status EXAM: CHEST  2 VIEW COMPARISON:  03/04/2012 FINDINGS: Hypoventilation with mild bibasilar atelectasis. Negative for heart failure or effusion. No definite pneumonia. IMPRESSION: Hypoventilation with bibasilar atelectasis. Electronically Signed   By: Franchot Gallo M.D.   On: 12/30/2014 15:48   Ct Head Wo Contrast  12/30/2014  CLINICAL DATA:  Stroke 3 months ago with left-sided weakness. Progressive confusion. EXAM: CT HEAD WITHOUT CONTRAST TECHNIQUE: Contiguous axial images were obtained from the base of the skull through the vertex  without intravenous contrast. COMPARISON:  Brain MR a of 04/02/2012; report not available. Head CT 03/17/2012. FINDINGS: Sinuses/Soft tissues: Minimal fluid left sphenoid sinus. Opacification of the anterior right ethmoid air cells and inferior aspect of the frontal sinuses. Prior surgical changes about the frontal sinuses, chronic. Hyperostosis frontalis interna. Intracranial: Cerebral atrophy. Moderate low density in the periventricular white matter likely related to small vessel disease. Dense vertebral and carotid atherosclerosis. Suspect remote right parietal cortically based infarct, chronic. No mass lesion, hemorrhage, hydrocephalus, acute infarct, intra-axial, or extra-axial fluid collection. IMPRESSION: 1.  No acute intracranial abnormality. 2. Sinus disease 3.  Cerebral atrophy and small vessel ischemic change. 4. Remote right parietal infarct. Electronically Signed   By: Abigail Miyamoto M.D.   On: 12/30/2014 17:42   Ct Chest Wo Contrast  12/30/2014  CLINICAL DATA:  Altered mental status.  Recent stroke. EXAM: CT CHEST WITHOUT CONTRAST TECHNIQUE: Multidetector CT imaging of the chest was performed following the standard protocol without IV contrast. COMPARISON:  Radiograph 12/30/2014 FINDINGS: Mediastinum/Nodes: No axillary or supraclavicular adenopathy. No mediastinal or hilar adenopathy. No pericardial fluid. Coronary calcifications noted. Esophagus normal. Ectatic aorta. Lungs/Pleura: Mild atelectasis the lung bases. No pulmonary edema, pneumothorax, or infiltrate. Upper abdomen: Limited view of the liver, kidneys, pancreas are unremarkable. Normal adrenal glands. Musculoskeletal: Osteophytosis of thoracic spine. No acute findings. No rib fracture IMPRESSION: 1. Mild basilar atelectasis.  No edema, infiltrate, or pneumothorax. 2. Atherosclerotic calcification aorta Electronically Signed   By: Suzy Bouchard M.D.   On: 12/30/2014 17:41   I have personally reviewed and evaluated these images and lab  results as part of my medical decision-making.   EKG Interpretation   Date/Time:  Thursday December 30 2014 14:27:20 EDT Ventricular Rate:  55 PR Interval:    QRS Duration: 136 QT Interval:  478 QTC Calculation: 457 R Axis:   -19 Text Interpretation:  Junctional rhythm IVCD, consider atypical RBBB  Confirmed by Maddex Garlitz  MD, Emilian Stawicki ([id number]) on 12/30/2014 3:40:53 PM      MDM   Final diagnoses:  Hypoxia    Patient on room air drops she's in saturations down to 86% does not appear to be any distress. CT of the chest and chest x-ray without any acute findings. Patient is not tachycardic or appears to be in any distress.  Additional workup for the general decline in mentation include head CT and labs without any significant findings. Urinalysis is still pending. Patient is not on any oxygen at home. According to patient's wife of these low oxygen saturations have been occurring on and off for the past 2 weeks. Because he has physical therapy come out may check it before and after.  Due to the oxygen required patient is to require admission overnight until home oxygen can be arranged.  CT angios chest was not done due to the elevated creatinine. However patient's not tachycardic but  pulmonary emesis not been ruled out.     Fredia Sorrow, MD 12/30/14 [phone]

## 2014-12-30 NOTE — H&P (Signed)
[facility] History and Physical  Benjamin Kaiser GHW:[id number] DOB: January 06, 1935 DOA: 12/30/2014  Referring physician: Fredia Sorrow, MD PCP: Leonard Downing, MD   Chief Complaint: Altered Mental Status  HPI: Benjamin Kaiser is a 80 y.o. male with a past medical history of hypertension, seizure disorder, hyperlipidemia, gout, DVT, urolithiasis, BPH, CVA, CAD, chronic diastolic heart failure, however take a stenosis, GERD who is brought to the emergency department via EMS due to hypoxia in the low to mid 66s At home for 2 or more weeks, which corrected briefly with incentive spirometry, but then decreased again to the 57s. Per wife and family members, they have also noticed that the patient has been less alert and more confused over the past few weeks. They state that his mental status seems to be improved on oxygen. The patient is hearing impaired, he took his time answering mental status questions while on oxygen, but he is now oriented to name, place and partially to time. Family members denied fever, cough, emesis, diarrhea. He  Review of Systems:  Unable to evaluate due to the patient's mental status.  Past Medical History  Diagnosis Date  . Hypertension   . Seizure disorder ([place])   . Hyperlipidemia   . Bradycardia     secondary to Atenolol  . Gout   . Obesity   . DVT (deep venous thrombosis) ([place]) 2003    Right lower extremity  . Bladder calculi     status cystoscopy  . BPH (benign prostatic hypertrophy)     status post TURP  . History of stroke   . CAD (coronary artery disease)     a. NSTEMI (8/12). LHC with total occlusion LAD and collaterals from RCA, 90% serial CFX stenoses. Patient was evaluated for CABG but was turned down by CVTS given obesity and poor functional status. He had DES to CFX placed instead.   . Chronic diastolic heart failure ([place])     a. Echo (8/12) with EF 45-50%, anteroseptal hypokinesis, mild LV hypertrophy, normal RV, aortic sclerosis;   b. Echo (3/13) with EF 50-55%, mild AS.   Marland Kitchen Aortic stenosis     a. mild by echo 04/2011  . GERD (gastroesophageal reflux disease)   . Neuropathy ([place])   . Stroke [facility])    Past Surgical History  Procedure Laterality Date  . Transurethral resection of prostate    . Cataract surgery     Social History:  reports that he has never smoked. He quit smokeless tobacco use about 25 years ago. His smokeless tobacco use included Chew. He reports that he does not drink alcohol or use illicit drugs.  Allergies  Allergen Reactions  . Niacin And Related     Occasional flushing, itching    Family History  Problem Relation Age of Onset  . Lung cancer Father   . Suicidality Son   . Stroke Son   . Heart attack Son   . Heart attack Son   . Fainting Brother     Prior to Admission medications   Medication Sig Start Date End Date Taking? Authorizing Provider  aspirin EC 81 MG tablet Take 81 mg by mouth daily.   Yes Historical Provider, MD  atorvastatin (LIPITOR) 80 MG tablet Take 1 tablet by mouth  daily 10/21/14  Yes Larey Dresser, MD  clopidogrel (PLAVIX) 75 MG tablet Take 1 tablet by mouth  daily 08/17/14  Yes Larey Dresser, MD  docusate sodium (COLACE) 100 MG capsule Take 100 mg by  mouth 2 (two) times daily as needed for mild constipation.   Yes Historical Provider, MD  furosemide (LASIX) 40 MG tablet Take 1 tablet by mouth  daily Patient taking differently: Take 1 tablet in the morning and 0.5 tablet at lunch time 08/17/14  Yes Larey Dresser, MD  gabapentin (NEURONTIN) 300 MG capsule Take 2 capsules (600 mg total) by mouth 3 (three) times daily. 08/16/14  Yes Garvin Fila, MD  ibuprofen (ADVIL,MOTRIN) 200 MG tablet Take 400 mg by mouth every 6 (six) hours as needed for fever, headache, mild pain, moderate pain or cramping.   Yes Historical Provider, MD  influenza vac recombinant HA trivalent (FLUBLOK) injection Inject 0.5 mLs into the muscle once.   Yes Historical Provider, MD  isosorbide  mononitrate (IMDUR) 30 MG 24 hr tablet Take 1 tablet by mouth  daily 04/22/14  Yes Larey Dresser, MD  metoprolol succinate (TOPROL-XL) 50 MG 24 hr tablet Take 1 tablet by mouth  daily . Take with or  immediately following a  meal. Patient taking differently: Take 0.5 tablet by mouth daily . Take with or  immediately following a  meal. 08/17/14  Yes Larey Dresser, MD  Multiple Vitamins-Minerals (EYE VITAMINS) CAPS Take 1 capsule by mouth daily.     Yes Historical Provider, MD  neomycin-bacitracin-polymyxin (NEOSPORIN) ointment Apply 1 application topically as needed for wound care.   Yes Historical Provider, MD  nitroGLYCERIN (NITROSTAT) 0.4 MG SL tablet Place 1 tablet (0.4 mg total) under the tongue every 5 (five) minutes as needed. 10/02/13  Yes Larey Dresser, MD  pantoprazole (PROTONIX) 40 MG tablet Take 1 tablet (40 mg total) by mouth daily. 12/24/14  Yes Larey Dresser, MD  Propylene Glycol (SYSTANE BALANCE) 0.6 % SOLN Place 1-2 drops into both eyes at bedtime as needed (for dry eyes).   Yes Historical Provider, MD  Tamsulosin HCl (FLOMAX) 0.4 MG CAPS Take 0.4 mg by mouth daily.   Yes Historical Provider, MD  traZODone (DESYREL) 150 MG tablet Take 50-150 mg by mouth at bedtime as needed for sleep.   Yes Historical Provider, MD  Vitamins A & D (VITAMIN A & D) ointment Apply 1 application topically as needed for dry skin.   Yes Historical Provider, MD  ZETIA 10 MG tablet Take 1 tablet by mouth  daily 08/17/14  Yes Larey Dresser, MD  citalopram (CELEXA) 20 MG tablet Take 1 tablet (20 mg total) by mouth daily. Patient not taking: Reported on 12/30/2014 04/21/14   Larey Dresser, MD   Physical Exam: Filed Vitals:   12/30/14 1428 12/30/14 1429 12/30/14 1647  BP: 108/64  134/79  Pulse: 55  63  Temp: 98.1 F (36.7 C)    TempSrc: Oral    Resp: 16  16  SpO2: 87% 93% 96%    Wt Readings from Last 3 Encounters:  04/21/14 166.924 kg (368 lb)  04/08/13 163.295 kg (360 lb)  03/10/13 167.377 kg  (369 lb)    General:  Appears calm and comfortable Eyes: PERRL, normal lids, irises & conjunctiva ENT: Moderately impaired hearing, lips & tongue Neck: no LAD, masses or thyromegaly Cardiovascular: RRR, no m/r/g. Positive 1+ LE edema. Telemetry: SR, no arrhythmias  Respiratory: CTA bilaterally, no w/r/r. Decreased breath sounds at lung bases Abdomen: soft, ntnd Skin: no rash or induration seen on limited exam Musculoskeletal: grossly normal tone BUE/BLE Psychiatric: grossly normal mood and affect, speech is slowed, but fluent and appropriate. Neurologic: Awake alert oriented 2, partially  oriented to time, grossly non-focal.          Labs on Admission:  Basic Metabolic Panel:  Recent Labs Lab 12/30/14 1547  NA 143  K 3.3*  CL 102  CO2 34*  GLUCOSE 116*  BUN 20  CREATININE 1.43*  CALCIUM 8.8*   Liver Function Tests:  Recent Labs Lab 12/30/14 1547  AST 15  ALT 12*  ALKPHOS 58  BILITOT 0.7  PROT 6.7  ALBUMIN 3.0*   CBC:  Recent Labs Lab 12/30/14 1547  WBC 12.1*  NEUTROABS 9.1*  HGB 13.9  HCT 43.6  MCV 87.6  PLT 264   Cardiac Enzymes:  Recent Labs Lab 12/30/14 1547  TROPONINI <0.03    Radiological Exams on Admission: Dg Chest 2 View  12/30/2014  CLINICAL DATA:  Hypoxia.  Altered mental status EXAM: CHEST  2 VIEW COMPARISON:  03/04/2012 FINDINGS: Hypoventilation with mild bibasilar atelectasis. Negative for heart failure or effusion. No definite pneumonia. IMPRESSION: Hypoventilation with bibasilar atelectasis. Electronically Signed   By: Franchot Gallo M.D.   On: 12/30/2014 15:48   Ct Head Wo Contrast  12/30/2014  CLINICAL DATA:  Stroke 3 months ago with left-sided weakness. Progressive confusion. EXAM: CT HEAD WITHOUT CONTRAST TECHNIQUE: Contiguous axial images were obtained from the base of the skull through the vertex without intravenous contrast. COMPARISON:  Brain MR a of 04/02/2012; report not available. Head CT 03/17/2012. FINDINGS:  Sinuses/Soft tissues: Minimal fluid left sphenoid sinus. Opacification of the anterior right ethmoid air cells and inferior aspect of the frontal sinuses. Prior surgical changes about the frontal sinuses, chronic. Hyperostosis frontalis interna. Intracranial: Cerebral atrophy. Moderate low density in the periventricular white matter likely related to small vessel disease. Dense vertebral and carotid atherosclerosis. Suspect remote right parietal cortically based infarct, chronic. No mass lesion, hemorrhage, hydrocephalus, acute infarct, intra-axial, or extra-axial fluid collection. IMPRESSION: 1.  No acute intracranial abnormality. 2. Sinus disease 3.  Cerebral atrophy and small vessel ischemic change. 4. Remote right parietal infarct. Electronically Signed   By: Abigail Miyamoto M.D.   On: 12/30/2014 17:42   Ct Chest Wo Contrast  12/30/2014  CLINICAL DATA:  Altered mental status.  Recent stroke. EXAM: CT CHEST WITHOUT CONTRAST TECHNIQUE: Multidetector CT imaging of the chest was performed following the standard protocol without IV contrast. COMPARISON:  Radiograph 12/30/2014 FINDINGS: Mediastinum/Nodes: No axillary or supraclavicular adenopathy. No mediastinal or hilar adenopathy. No pericardial fluid. Coronary calcifications noted. Esophagus normal. Ectatic aorta. Lungs/Pleura: Mild atelectasis the lung bases. No pulmonary edema, pneumothorax, or infiltrate. Upper abdomen: Limited view of the liver, kidneys, pancreas are unremarkable. Normal adrenal glands. Musculoskeletal: Osteophytosis of thoracic spine. No acute findings. No rib fracture IMPRESSION: 1. Mild basilar atelectasis.  No edema, infiltrate, or pneumothorax. 2. Atherosclerotic calcification aorta Electronically Signed   By: Suzy Bouchard M.D.   On: 12/30/2014 17:41    EKG: Independently reviewed. Vent. rate 55 BPM PR interval * ms QRS duration 136 ms QT/QTc 478/457 ms P-R-T axes -1 -19 9 Junctional rhythm IVCD, consider atypical  RBBB  Assessment/Plan Principal Problem:   Hypoxia Admit to telemetry for cardiac monitoring. Check ventilation/perfusion scan in the morning. Check venous duplex of the lower extremities. Check echocardiogram.  Active Problems:   CAD (coronary artery disease)   Diastolic CHF, chronic (HCC)   Aortic stenosis Continue aspirin and clopidogrel. Continue metoprolol 25 mg by mouth daily. Check echocardiogram.    Hyperlipidemia Continue atorvastatin 80 mg by mouth daily. Monitor LFTs periodically.    Hypertension Continue current  antihypertensive therapy. Monitor blood pressure periodically.    CVA (cerebral vascular accident) [facility]) Continue physical therapy at home and supportive care.    Hypokalemia Replaced potassium orally. Follow-up level in the morning.    Code Status: Full code. DVT Prophylaxis: Lovenox SQ. Family Communication:  Disposition Plan: Admit for observation and perform VQ scan in the morning.  Time spent: Over 70 minutes were spent during the process of this admission.  Reubin Milan [facility] Pager [phone].

## 2014-12-30 NOTE — ED Notes (Addendum)
Per EMS pt had stroke 3 months ago with left sided residual and wife now noticing decline in mentation (confusion; slow thinking) over last 2 weeks; home visit nurse noted pt was staring off into space and O2 sat was 88%; EMS put nonrebreather and sats came up on 17ml; nsal cannula en route pt was stable O2 in 90s; pt supposed to be on BiPAP; difficulty in ambulation; bedridden since 10/29; hx multiple stroke; neuropathy; MI with stent ;AXOX4 at this time

## 2014-12-31 ENCOUNTER — Ambulatory Visit (HOSPITAL_COMMUNITY)
Admit: 2014-12-31 | Discharge: 2014-12-31 | Disposition: A | Discharge status: Home / Self Care | Payer: Medicare Other | Attending: Internal Medicine | Admitting: Internal Medicine

## 2014-12-31 ENCOUNTER — Observation Stay (HOSPITAL_COMMUNITY): Payer: Medicare Other

## 2014-12-31 ENCOUNTER — Observation Stay (HOSPITAL_BASED_OUTPATIENT_CLINIC_OR_DEPARTMENT_OTHER): Payer: Medicare Other

## 2014-12-31 DIAGNOSIS — Z86718 Personal history of other venous thrombosis and embolism: Secondary | ICD-10-CM

## 2014-12-31 DIAGNOSIS — I350 Nonrheumatic aortic (valve) stenosis: Secondary | ICD-10-CM

## 2014-12-31 LAB — COMPREHENSIVE METABOLIC PANEL
ALBUMIN: 2.7 g/dL — AB (ref 3.5–5.0)
ALK PHOS: 52 U/L (ref 38–126)
ALT: 11 U/L — AB (ref 17–63)
AST: 13 U/L — AB (ref 15–41)
Anion gap: 8 (ref 5–15)
BILIRUBIN TOTAL: 0.7 mg/dL (ref 0.3–1.2)
BUN: 20 mg/dL (ref 6–20)
CO2: 34 mmol/L — ABNORMAL HIGH (ref 22–32)
CREATININE: 1.34 mg/dL — AB (ref 0.61–1.24)
Calcium: 8.6 mg/dL — ABNORMAL LOW (ref 8.9–10.3)
Chloride: 101 mmol/L (ref 101–111)
GFR calc Af Amer: 57 mL/min — ABNORMAL LOW (ref >60)
GFR calc non Af Amer: 49 mL/min — ABNORMAL LOW (ref >60)
GLUCOSE: 122 mg/dL — AB (ref 65–99)
POTASSIUM: 3.2 mmol/L — AB (ref 3.5–5.1)
Sodium: 143 mmol/L (ref 135–145)
TOTAL PROTEIN: 5.9 g/dL — AB (ref 6.5–8.1)

## 2014-12-31 LAB — CBC
HEMATOCRIT: 42.9 % (ref 39.0–52.0)
HEMOGLOBIN: 13.1 g/dL (ref 13.0–17.0)
MCH: 27.5 pg (ref 26.0–34.0)
MCHC: 30.5 g/dL (ref 30.0–36.0)
MCV: 90.1 fL (ref 78.0–100.0)
Platelets: 240 10*3/uL (ref 150–400)
RBC: 4.76 MIL/uL (ref 4.22–5.81)
RDW: 15.6 % — AB (ref 11.5–15.5)
WBC: 11.4 10*3/uL — AB (ref 4.0–10.5)

## 2014-12-31 MED ORDER — TECHNETIUM TO 99M ALBUMIN AGGREGATED: 4.3000 | Freq: Once | INTRAVENOUS | Status: DC | PRN

## 2014-12-31 MED ORDER — IPRATROPIUM-ALBUTEROL 0.5-2.5 (3) MG/3ML IN SOLN: 3.0000 mL | Freq: Four times a day (QID) | RESPIRATORY_TRACT | Status: DC | PRN

## 2014-12-31 MED ORDER — PERFLUTREN LIPID MICROSPHERE
1.0000 mL | INTRAVENOUS | Status: AC | PRN
  Administered 2014-12-31: 2 mL via INTRAVENOUS
  Filled 2014-12-31: qty 10

## 2014-12-31 MED ORDER — PERFLUTREN LIPID MICROSPHERE
INTRAVENOUS | Status: AC
  Filled 2014-12-31: qty 10

## 2014-12-31 MED ORDER — TECHNETIUM TC 99M DIETHYLENETRIAME-PENTAACETIC ACID: 32.0000 | Freq: Once | INTRAVENOUS | Status: DC | PRN

## 2014-12-31 NOTE — Care Management Note (Signed)
Case Management Note  Patient Details  Name: LORRAINE TERRIQUEZ MRN: [id number] Date of Birth: 1935-02-19  Subjective/Objective: 80 y/o m admitted w/hypoxia. From home. Noted 02 sats.Will monitor for home 02.                   Action/Plan:d/c plan home.   Expected Discharge Date:   (unknown)               Expected Discharge Plan:  Home/Self Care  In-House Referral:     Discharge planning Services  CM Consult  Post Acute Care Choice:    Choice offered to:     DME Arranged:    DME Agency:     HH Arranged:    HH Agency:     Status of Service:  In process, will continue to follow  Medicare Important Message Given:    Date Medicare IM Given:    Medicare IM give by:    Date Additional Medicare IM Given:    Additional Medicare Important Message give by:     If discussed at [place] of Stay Meetings, dates discussed:    Additional Comments:  Dessa Phi, RN 12/31/2014, 1:03 PM

## 2014-12-31 NOTE — Progress Notes (Signed)
   Echocardiogram 2D Echocardiogram with Definity has been performed.  Benjamin Kaiser 12/31/2014, 10:18 AM

## 2014-12-31 NOTE — Progress Notes (Signed)
VASCULAR LAB PRELIMINARY  PRELIMINARY  PRELIMINARY  PRELIMINARY  Bilateral lower extremity venous duplex  completed.    Preliminary report:  Bilateral:  No evidence of DVT, superficial thrombosis, or Baker's Cyst.    Rechel Delosreyes, RVT 12/31/2014, 2:17 PM

## 2014-12-31 NOTE — Progress Notes (Addendum)
Patient is seen & examined. Please see today's H&P for the details. 80 y/o male with PMH of HTN, CAD CHF, Stroke, Seizures, is admitted with acute encephalopathy (duration for 2 week per his wife) and hypoxia (found in ED). Acute encephalopathy of unclear etiology. ? Underlying MCI. Exam is no focal except confusion. we will obtain MRI brain.  Hypoxia->resolved with nasal oxygen 2 LPM. Awaiting VQ scan, echo.  Patient denies any acute cardiopulmonary sym[ptoms, no chest pains, or SOB. ? Hypoventilation vs underlying COPD (h/o tobacco use). Plan of care as in H&P. Will hold also metoprolol due to bradycardia.  Consult  PT/OT Rowe Clack N D/w patient, his wife. RN

## 2015-01-01 ENCOUNTER — Encounter (HOSPITAL_COMMUNITY): Payer: Self-pay | Admitting: Internal Medicine

## 2015-01-01 DIAGNOSIS — J9621 Acute and chronic respiratory failure with hypoxia: Secondary | ICD-10-CM | POA: Diagnosis not present

## 2015-01-01 DIAGNOSIS — I2510 Atherosclerotic heart disease of native coronary artery without angina pectoris: Secondary | ICD-10-CM

## 2015-01-01 DIAGNOSIS — E785 Hyperlipidemia, unspecified: Secondary | ICD-10-CM | POA: Diagnosis not present

## 2015-01-01 DIAGNOSIS — N183 Chronic kidney disease, stage 3 unspecified: Secondary | ICD-10-CM

## 2015-01-01 DIAGNOSIS — I10 Essential (primary) hypertension: Secondary | ICD-10-CM

## 2015-01-01 DIAGNOSIS — J9620 Acute and chronic respiratory failure, unspecified whether with hypoxia or hypercapnia: Secondary | ICD-10-CM | POA: Diagnosis present

## 2015-01-01 DIAGNOSIS — I5032 Chronic diastolic (congestive) heart failure: Secondary | ICD-10-CM | POA: Diagnosis not present

## 2015-01-01 DIAGNOSIS — R0689 Other abnormalities of breathing: Secondary | ICD-10-CM | POA: Diagnosis not present

## 2015-01-01 DIAGNOSIS — G9340 Encephalopathy, unspecified: Secondary | ICD-10-CM | POA: Diagnosis not present

## 2015-01-01 DIAGNOSIS — L8990 Pressure ulcer of unspecified site, unspecified stage: Secondary | ICD-10-CM

## 2015-01-01 HISTORY — DX: Chronic kidney disease, stage 3 unspecified: N18.30

## 2015-01-01 LAB — BASIC METABOLIC PANEL
ANION GAP: 5 (ref 5–15)
BUN: 18 mg/dL (ref 6–20)
CALCIUM: 8.4 mg/dL — AB (ref 8.9–10.3)
CO2: 35 mmol/L — ABNORMAL HIGH (ref 22–32)
Chloride: 100 mmol/L — ABNORMAL LOW (ref 101–111)
Creatinine, Ser: 1.35 mg/dL — ABNORMAL HIGH (ref 0.61–1.24)
GFR, EST AFRICAN AMERICAN: 56 mL/min — AB (ref >60)
GFR, EST NON AFRICAN AMERICAN: 49 mL/min — AB (ref >60)
GLUCOSE: 109 mg/dL — AB (ref 65–99)
Potassium: 3.9 mmol/L (ref 3.5–5.1)
Sodium: 140 mmol/L (ref 135–145)

## 2015-01-01 LAB — BLOOD GAS, ARTERIAL
Acid-Base Excess: 6.3 mmol/L — ABNORMAL HIGH (ref 0.0–2.0)
BICARBONATE: 32.8 meq/L — AB (ref 20.0–24.0)
Drawn by: 105521
O2 Content: 2 L/min
O2 SAT: 92 %
PATIENT TEMPERATURE: 98.6
PO2 ART: 70.3 mmHg — AB (ref 80.0–100.0)
TCO2: 29.8 mmol/L (ref 0–100)
pCO2 arterial: 59 mmHg (ref 35.0–45.0)
pH, Arterial: 7.365 (ref 7.350–7.450)

## 2015-01-01 LAB — GLUCOSE, CAPILLARY: Glucose-Capillary: 92 mg/dL (ref 65–99)

## 2015-01-01 MED ORDER — PROSIGHT PO TABS
1.0000 | ORAL_TABLET | Freq: Every day | ORAL | Status: DC
  Administered 2015-01-01 – 2015-01-07 (×7): 1 via ORAL
  Filled 2015-01-01 (×7): qty 1

## 2015-01-01 NOTE — Progress Notes (Signed)
Rt took pt off BIPAP. Pt knows date, month, hospital and can talk in full sentences. Pt in no distress at this time. Rt will continue to monitor.

## 2015-01-01 NOTE — Progress Notes (Signed)
Pt still awake, alert, in no respiratory distress at this time, answering questions appropriately.  Pt remains on 2lnc, HR54, rr19, spo2 99%.  Bipap not indicated at this time.  RT will continue to monitor and assess as needed.

## 2015-01-01 NOTE — Progress Notes (Signed)
Pt found on 2lnc, no respiratory distress noted or voiced by pt at this time.  HR56, rr15, spo2 98%.  Pt speaking in full sentences and answering questions appropriately .  RN in agreement, at bedside.  Bipap not indicated at this time, but in room on standby as needed.  RT will continue to monitor pt throughout the night.

## 2015-01-01 NOTE — Consult Note (Signed)
WOC wound consult note Reason for Consult: Pressure ulcer Patient from home with wife. She reports a fall from the bed a little over two weeks ago. She reports that she has to call the EMS/FD to come to transfer him for appointments.  She reports that sometime in the last few weeks when EMS came they had lifted him under his arms and she noted this new skin tear under the left arm at that time. Patient in incontinent, has condom cath in place Wound type: MASD moisture associated skin damage to the buttocks, gluteal skin folds. Skin tear upper left chest, axilla  Pressure Ulcer POA: No Measurement: Skin tear: 0.3cm x 1.5cm x 0.1cm with thin film in place Wound SWH:QPRF superficial skin peeling of the buttocks related to moisture, wife reports no use of incontinence briefs at home.  Drainage (amount, consistency, odor) skin tear with no drainage.  Periwound:intact  Dressing procedure/placement/frequency: Bariatric air mattress in place.  Continue thin film to the skin tear until it starts to loosen at which time will switch to foam.  Moisture barrier cream to the buttocks to protect the skin from incontinence. LALM on the bariatric bed to assist with moisture management.  Discussed POC  bedside nurse.  Re consult if needed, will not follow at this time. Thanks  [name] [place], [place] [phone])

## 2015-01-01 NOTE — Progress Notes (Signed)
Progress Note   Benjamin Kaiser SAY:[id number] DOB: 26-Jan-1935 DOA: 12/30/2014 PCP: Leonard Downing, MD   Brief Narrative:   Benjamin Kaiser is an 80 y.o. male with a PMH of hypertension, seizure disorder, hyperlipidemia, DVT, CVA, CAD and chronic diastolic CHF who was admitted 12/30/14 with a two-week history of altered mental status including increased somnolence in the setting of hypoxia with oxygen saturations in the mid 80s.  Assessment/Plan:   Principal Problem:   Acute on chronic respiratory failure with hypoxia and hypercarbia - Oxygen saturations 90-97 percent on supplemental oxygen. - VQ scan done 12/31/14: Low probability for PE. Lower extremity Dopplers negative for DVT. - 2-D echo done 12/31/14: EF 55-60 percent, grade 1 diastolic dysfunction noted. - Suspect CO2 narcosis, confirmed by ABG. Transfer to SDU and placed on BiPAP. - Underlying etiology is likely obesity hypoventilation syndrome and obstructive sleep apnea.  Active Problems:   Stage III chronic kidney disease - Baseline creatinine 1.3 with a GFR less than 60, consistent with chronic kidney disease. - Creatinine consistent with usual baseline values.    CAD (coronary artery disease)/Diastolic CHF, chronic (HCC)/aortic stenosis - No significant findings on echocardiogram. Troponin negative. - Continue aspirin, Plavix and Imdur. - Monitoring on telemetry.    Hyperlipidemia - Continue statin and Zetia.    Hypertension - Continue Lasix, Imdur.    CVA (cerebral vascular accident) ([place]) - Continue risk factor modification. Continue aspirin/Plavix, statin.    Hypokalemia - Repleted.    Pressure ulcer - Wound care RN consultation. Bariatric air mattress.    DVT Prophylaxis - Lovenox ordered.   Family Communication/Anticipated D/C date and plan/Code Status   Family Communication: Wife updated at the bedside. Disposition Plan: Home when respiratory status stable. Will likely need home BiPAP or C  Pap. Anticipated D/C date:   2-3 days. Code Status:     Code Status Orders        Start     Ordered   12/30/14 2135  Full code   Continuous     12/30/14 2134    Advance Directive Documentation        Most Recent Value   Type of Advance Directive  Healthcare Power of Attorney, Living will   Pre-existing out of facility DNR order (yellow form or pink MOST form)     "MOST" Form in Place?         IV Access:    Peripheral IV   Procedures and diagnostic studies:   Nm Pulmonary Perf And Vent  12/31/2014  CLINICAL DATA:  Hypoxia EXAM: NUCLEAR MEDICINE VENTILATION - PERFUSION LUNG SCAN TECHNIQUE: Ventilation images were obtained in multiple projections using inhaled aerosol Tc-45m DTPA. Perfusion images were obtained in multiple projections after intravenous injection of Tc-69m MAA. RADIOPHARMACEUTICALS:  32 millicuries [id number]-54Y DTPA aerosol inhalation and 4.3 millicuries of HCWCBJSEGB-15V MAA IV COMPARISON:  Chest x-ray 12/30/2014 FINDINGS: Ventilation: Patchy perfusion defects within the lungs. Perfusion: Matching patchy perfusion defects throughout the lungs. No segmental or subsegmental defects. IMPRESSION: Low probability study for pulmonary embolus. Electronically Signed   By: Rolm Baptise M.D.   On: 12/31/2014 12:59   Mr Attempted Daymon Larsen Report  12/31/2014  This examination belongs to an outside facility and is stored here for comparison purposes only.  Contact the originating outside institution for any associated report or interpretation.    Medical Consultants:    None.  Anti-Infectives:   Anti-infectives    None      Subjective:   Benjamin Severe  Kaiser is still fairly somnolent and unable to awaken to answer any questions. His wife is at the bedside and reports that he has not woken up for long intervals since his admission.    Objective:    Filed Vitals:   12/31/14 1430 12/31/14 1500 12/31/14 2100 01/01/15 0552  BP:  104/69 124/69 100/69  Pulse:  55 55  53  Temp:  97.7 F (36.5 C) 97.8 F (36.6 C) 97.5 F (36.4 C)  TempSrc:  Oral Oral Oral  Resp:  18 20 20   Height:      Weight:      SpO2: 90% 97% 95% 95%    Intake/Output Summary (Last 24 hours) at 01/01/15 0850 Last data filed at 01/01/15 0007  Gross per 24 hour  Intake    483 ml  Output    900 ml  Net   -417 ml   Filed Weights   12/30/14 2133  Weight: 157.852 kg (348 lb)    Exam: Gen:  Somnolent Cardiovascular:  RRR, No M/R/G Respiratory:  Lungs diminished throughout Gastrointestinal:  Abdomen soft, NT/ND, + BS Extremities:  Trace edema   Data Reviewed:    Labs: Basic Metabolic Panel:  Recent Labs Lab 12/30/14 1547 12/31/14 0429 01/01/15 0535  NA 143 143 140  K 3.3* 3.2* 3.9  CL 102 101 100*  CO2 34* 34* 35*  GLUCOSE 116* 122* 109*  BUN 20 20 18   CREATININE 1.43* 1.34* 1.35*  CALCIUM 8.8* 8.6* 8.4*   GFR Estimated Creatinine Clearance: 71.8 mL/min (by C-G formula based on Cr of 1.35). Liver Function Tests:  Recent Labs Lab 12/30/14 1547 12/31/14 0429  AST 15 13*  ALT 12* 11*  ALKPHOS 58 52  BILITOT 0.7 0.7  PROT 6.7 5.9*  ALBUMIN 3.0* 2.7*   CBC:  Recent Labs Lab 12/30/14 1547 12/31/14 0429  WBC 12.1* 11.4*  NEUTROABS 9.1*  --   HGB 13.9 13.1  HCT 43.6 42.9  MCV 87.6 90.1  PLT 264 240   Cardiac Enzymes:  Recent Labs Lab 12/30/14 1547  TROPONINI <0.03   Microbiology No results found for this or any previous visit (from the past 240 hour(s)).   Medications:   . aspirin EC  81 mg Oral Daily  . atorvastatin  80 mg Oral q1800  . clopidogrel  75 mg Oral Daily  . enoxaparin (LOVENOX) injection  80 mg Subcutaneous Q24H  . ezetimibe  10 mg Oral Daily  . furosemide  40 mg Oral Daily  . gabapentin  600 mg Oral TID  . isosorbide mononitrate  30 mg Oral Daily  . multivitamin  1 tablet Oral Daily  . pantoprazole  40 mg Oral Daily  . potassium chloride  20 mEq Oral BID  . potassium chloride  40 mEq Oral Once  . sodium  chloride  3 mL Intravenous Q12H  . tamsulosin  0.4 mg Oral Daily   Continuous Infusions:   Time spent: 35 minutes.  The patient is medically complex with multiple co-morbidities and is at high risk for clinical deterioration and requires high complexity decision making.     [facility] Pager [phone]. If unable to reach me by pager, please call my cell phone at [phone].  *Please refer to [email or web address], password [email or web address] to get updated schedule on who will round on this patient, as hospitalists switch teams weekly. If 7PM-7AM, please contact night-coverage at [email or web address], password [email or web address] for any overnight needs.  01/01/2015, 8:50 AM

## 2015-01-01 NOTE — Progress Notes (Signed)
Rt placed pt on BIPAP in ICU per MD order.

## 2015-01-01 NOTE — Progress Notes (Signed)
PT Cancellation Note  Patient Details Name: Benjamin Kaiser MRN: [id number] DOB: 19-Feb-1935   Cancelled Treatment:    Reason Eval/Treat Not Completed: Patient at procedure or test/unavailable. Respiratory therapist in with pt. Will check back as schedule permits.   [name] 01/01/2015, 12:45 PM

## 2015-01-01 NOTE — Plan of Care (Signed)
Problem: Pain Managment: Goal: General experience of comfort will improve Outcome: Progressing Pt had earache but given tylenol, which was effective.

## 2015-01-02 DIAGNOSIS — I130 Hypertensive heart and chronic kidney disease with heart failure and stage 1 through stage 4 chronic kidney disease, or unspecified chronic kidney disease: Secondary | ICD-10-CM | POA: Diagnosis present

## 2015-01-02 DIAGNOSIS — L89329 Pressure ulcer of left buttock, unspecified stage: Secondary | ICD-10-CM | POA: Diagnosis present

## 2015-01-02 DIAGNOSIS — R0689 Other abnormalities of breathing: Secondary | ICD-10-CM | POA: Diagnosis not present

## 2015-01-02 DIAGNOSIS — R4182 Altered mental status, unspecified: Secondary | ICD-10-CM | POA: Diagnosis present

## 2015-01-02 DIAGNOSIS — N400 Enlarged prostate without lower urinary tract symptoms: Secondary | ICD-10-CM | POA: Diagnosis present

## 2015-01-02 DIAGNOSIS — Z6841 Body Mass Index (BMI) 40.0 and over, adult: Secondary | ICD-10-CM

## 2015-01-02 DIAGNOSIS — J9622 Acute and chronic respiratory failure with hypercapnia: Secondary | ICD-10-CM | POA: Diagnosis present

## 2015-01-02 DIAGNOSIS — G9340 Encephalopathy, unspecified: Secondary | ICD-10-CM | POA: Diagnosis present

## 2015-01-02 DIAGNOSIS — G629 Polyneuropathy, unspecified: Secondary | ICD-10-CM | POA: Diagnosis present

## 2015-01-02 DIAGNOSIS — H9190 Unspecified hearing loss, unspecified ear: Secondary | ICD-10-CM | POA: Diagnosis present

## 2015-01-02 DIAGNOSIS — I5032 Chronic diastolic (congestive) heart failure: Secondary | ICD-10-CM | POA: Diagnosis not present

## 2015-01-02 DIAGNOSIS — J9620 Acute and chronic respiratory failure, unspecified whether with hypoxia or hypercapnia: Secondary | ICD-10-CM | POA: Diagnosis not present

## 2015-01-02 DIAGNOSIS — K219 Gastro-esophageal reflux disease without esophagitis: Secondary | ICD-10-CM | POA: Diagnosis present

## 2015-01-02 DIAGNOSIS — R532 Functional quadriplegia: Secondary | ICD-10-CM | POA: Diagnosis present

## 2015-01-02 DIAGNOSIS — I2582 Chronic total occlusion of coronary artery: Secondary | ICD-10-CM | POA: Diagnosis present

## 2015-01-02 DIAGNOSIS — Z86718 Personal history of other venous thrombosis and embolism: Secondary | ICD-10-CM | POA: Diagnosis not present

## 2015-01-02 DIAGNOSIS — N183 Chronic kidney disease, stage 3 (moderate): Secondary | ICD-10-CM | POA: Diagnosis present

## 2015-01-02 DIAGNOSIS — I252 Old myocardial infarction: Secondary | ICD-10-CM | POA: Diagnosis not present

## 2015-01-02 DIAGNOSIS — Z791 Long term (current) use of non-steroidal anti-inflammatories (NSAID): Secondary | ICD-10-CM | POA: Diagnosis not present

## 2015-01-02 DIAGNOSIS — M109 Gout, unspecified: Secondary | ICD-10-CM | POA: Diagnosis present

## 2015-01-02 DIAGNOSIS — G609 Hereditary and idiopathic neuropathy, unspecified: Secondary | ICD-10-CM

## 2015-01-02 DIAGNOSIS — E662 Morbid (severe) obesity with alveolar hypoventilation: Principal | ICD-10-CM

## 2015-01-02 DIAGNOSIS — Z888 Allergy status to other drugs, medicaments and biological substances status: Secondary | ICD-10-CM | POA: Diagnosis not present

## 2015-01-02 DIAGNOSIS — E785 Hyperlipidemia, unspecified: Secondary | ICD-10-CM | POA: Diagnosis present

## 2015-01-02 DIAGNOSIS — G40909 Epilepsy, unspecified, not intractable, without status epilepticus: Secondary | ICD-10-CM | POA: Diagnosis present

## 2015-01-02 DIAGNOSIS — Z823 Family history of stroke: Secondary | ICD-10-CM | POA: Diagnosis not present

## 2015-01-02 DIAGNOSIS — Z9079 Acquired absence of other genital organ(s): Secondary | ICD-10-CM | POA: Diagnosis not present

## 2015-01-02 DIAGNOSIS — I2510 Atherosclerotic heart disease of native coronary artery without angina pectoris: Secondary | ICD-10-CM | POA: Diagnosis present

## 2015-01-02 DIAGNOSIS — Z7982 Long term (current) use of aspirin: Secondary | ICD-10-CM | POA: Diagnosis not present

## 2015-01-02 DIAGNOSIS — I69354 Hemiplegia and hemiparesis following cerebral infarction affecting left non-dominant side: Secondary | ICD-10-CM | POA: Diagnosis not present

## 2015-01-02 DIAGNOSIS — I350 Nonrheumatic aortic (valve) stenosis: Secondary | ICD-10-CM | POA: Diagnosis present

## 2015-01-02 DIAGNOSIS — Z801 Family history of malignant neoplasm of trachea, bronchus and lung: Secondary | ICD-10-CM | POA: Diagnosis not present

## 2015-01-02 DIAGNOSIS — Z7902 Long term (current) use of antithrombotics/antiplatelets: Secondary | ICD-10-CM | POA: Diagnosis not present

## 2015-01-02 DIAGNOSIS — Z79899 Other long term (current) drug therapy: Secondary | ICD-10-CM | POA: Diagnosis not present

## 2015-01-02 DIAGNOSIS — E876 Hypokalemia: Secondary | ICD-10-CM | POA: Diagnosis present

## 2015-01-02 DIAGNOSIS — J9621 Acute and chronic respiratory failure with hypoxia: Secondary | ICD-10-CM | POA: Diagnosis not present

## 2015-01-02 DIAGNOSIS — L89319 Pressure ulcer of right buttock, unspecified stage: Secondary | ICD-10-CM | POA: Diagnosis present

## 2015-01-02 DIAGNOSIS — Z720 Tobacco use: Secondary | ICD-10-CM | POA: Diagnosis not present

## 2015-01-02 DIAGNOSIS — Z8249 Family history of ischemic heart disease and other diseases of the circulatory system: Secondary | ICD-10-CM | POA: Diagnosis not present

## 2015-01-02 LAB — MRSA PCR SCREENING: MRSA by PCR: NEGATIVE

## 2015-01-02 LAB — GLUCOSE, CAPILLARY: GLUCOSE-CAPILLARY: 111 mg/dL — AB (ref 65–99)

## 2015-01-02 MED ORDER — PHENOL 1.4 % MT LIQD
1.0000 | OROMUCOSAL | Status: DC | PRN
  Administered 2015-01-02 – 2015-01-03 (×3): 1 via OROMUCOSAL
  Filled 2015-01-02: qty 177

## 2015-01-02 NOTE — Evaluation (Signed)
Physical Therapy Evaluation Patient Details Name: Benjamin Kaiser MRN: [id number] DOB: 1934-11-20 Today's Date: 01/02/2015   History of Present Illness  79 yo male admitted with respiratory failure. Hx of HTN, Sz, gout, DVt, CVA, CAD, HF, obesity.   Clinical Impression  Bed level eval only: Pt is bedbound at baseline. Per wife, HHPT is currently following. Discussed d/c plan-plan is to return home. Recommend pt resume HHPT services as well. Will follow to continue exercise program.     Follow Up Recommendations Home health PT (to resume HHPT services)    Equipment Recommendations  None recommended by PT    Recommendations for Other Services       Precautions / Restrictions Precautions Precautions: Fall Restrictions Weight Bearing Restrictions: No      Mobility  Bed Mobility               General bed mobility comments: NT  Transfers                    Ambulation/Gait                Stairs            Wheelchair Mobility    Modified Rankin (Stroke Patients Only)       Balance                                             Pertinent Vitals/Pain Pain Assessment: No/denies pain    Home Living Family/patient expects to be discharged to:: Private residence Living Arrangements: Spouse/significant other   Type of Home: House         Home Equipment: Wheelchair - power;Hospital bed;Walker - 2 wheels;Bedside commode (trapeze, hoyer lift)      Prior Function Level of Independence: Needs assistance   Gait / Transfers Assistance Needed: non ambulatory  ADL's / Homemaking Assistance Needed: total assist        Hand Dominance        Extremity/Trunk Assessment   Upper Extremity Assessment: Defer to OT evaluation           Lower Extremity Assessment: Generalized weakness         Communication   Communication: HOH  Cognition Arousal/Alertness: Awake/alert Behavior During Therapy: WFL for tasks  assessed/performed Overall Cognitive Status: Within Functional Limits for tasks assessed                      General Comments      Exercises General Exercises - Upper Extremity Shoulder Flexion: AROM;Strengthening;10 reps;Both;Supine Elbow Flexion: AROM;Strengthening;Both;10 reps;Supine General Exercises - Lower Extremity Ankle Circles/Pumps: AROM;Both;10 reps;Supine Quad Sets: AROM;Both;10 reps;Supine Heel Slides: AAROM;Both;10 reps;Supine Straight Leg Raises: AROM;Both;10 reps;Supine      Assessment/Plan    PT Assessment Patient needs continued PT services  PT Diagnosis     PT Problem List Decreased strength;Decreased range of motion;Obesity  PT Treatment Interventions Patient/family education;Therapeutic exercise   PT Goals (Current goals can be found in the Care Plan section) Acute Rehab PT Goals Patient Stated Goal: to return home PT Goal Formulation: With family Time For Goal Achievement: 01/16/15 Potential to Achieve Goals: Fair    Frequency Min 2X/week   Barriers to discharge        Co-evaluation               End of Session  Activity Tolerance: Patient tolerated treatment well Patient left: in bed;with call bell/phone within reach;with bed alarm set;with family/visitor present      Functional Assessment Tool Used: clinical judgement Functional Limitation: Mobility: Walking and moving around Mobility: Walking and Moving Around Current Status (G3151): At least 80 percent but less than 100 percent impaired, limited or restricted Mobility: Walking and Moving Around Goal Status [phone]): At least 80 percent but less than 100 percent impaired, limited or restricted    Time: 1523-1540 PT Time Calculation (min) (ACUTE ONLY): 17 min   Charges:   PT Evaluation $Initial PT Evaluation Tier I: 1 Procedure     PT G Codes:   PT G-Codes **NOT FOR INPATIENT CLASS** Functional Assessment Tool Used: clinical judgement Functional Limitation:  Mobility: Walking and moving around Mobility: Walking and Moving Around Current Status (V3710): At least 80 percent but less than 100 percent impaired, limited or restricted Mobility: Walking and Moving Around Goal Status [phone]): At least 80 percent but less than 100 percent impaired, limited or restricted    Weston Anna, MPT Pager: [phone]

## 2015-01-02 NOTE — Progress Notes (Addendum)
Progress Note   Benjamin Kaiser ZJQ:[id number] DOB: Feb 23, 1935 DOA: 12/30/2014 PCP: Leonard Downing, MD   Brief Narrative:   Benjamin Kaiser is an 80 y.o. male with a PMH of hypertension, seizure disorder, hyperlipidemia, DVT, CVA, CAD and chronic diastolic CHF who was admitted 12/30/14 with a two-week history of altered mental status including increased somnolence in the setting of hypoxia with oxygen saturations in the mid 80s.  Assessment/Plan:   Principal Problem:   Acute on chronic respiratory failure with hypoxia and hypercarbia / acute encephalopathy secondary to CO2 narcosis - VQ scan done 12/31/14: Low probability for PE. Lower extremity Dopplers negative for DVT. - 2-D echo done 12/31/14: EF 55-60 percent, grade 1 diastolic dysfunction noted. - Acute encephalopathy appears to be secondary to CO2 narcosis, confirmed by ABG done 01/01/15.  - Responded well to BiPAP and has now been weaned off.  - Underlying etiology is likely obesity hypoventilation syndrome and obstructive sleep apnea.  - Will need sleep study and home C Pap. Will ask pulmonologist to evaluate.  Active Problems:   Functional quadriplegia (HCC) / Hereditary and idiopathic peripheral neuropathy (HCC) / Morbid obesity - Non-ambulatory for the past 3-4 months. - PT/OT evaluations requested.  Dietician consult to address morbid obesity. - Continue Neurontin.    Stage III chronic kidney disease - Baseline creatinine 1.3 with a GFR less than 60, consistent with chronic kidney disease. - Creatinine consistent with usual baseline values.    CAD (coronary artery disease)/Diastolic CHF, chronic (HCC)/aortic stenosis - No significant findings on echocardiogram. Troponin negative. - Continue aspirin, Plavix and Imdur. - Monitoring on telemetry.    Hyperlipidemia - Continue statin and Zetia.    Hypertension - Continue Lasix, Imdur.    CVA (cerebral vascular accident) ([place]) - Continue risk factor modification.  Continue aspirin/Plavix, statin.    Hypokalemia - Repleted.    Pressure ulcer - Wound care RN consultation. Bariatric air mattress.    DVT Prophylaxis - Lovenox ordered.   Family Communication/Anticipated D/C date and plan/Code Status   Family Communication: Wife updated by telephone. Disposition Plan: Home versus SNF when respiratory status stable & source of immobility addressed. Will likely need home BiPAP or C Pap. Anticipated D/C date:   1-2 days. Code Status:     Code Status Orders        Start     Ordered   12/30/14 2135  Full code   Continuous     12/30/14 2134    Advance Directive Documentation        Most Recent Value   Type of Advance Directive  Healthcare Power of Attorney, Living will   Pre-existing out of facility DNR order (yellow form or pink MOST form)     "MOST" Form in Place?         IV Access:    Peripheral IV   Procedures and diagnostic studies:   Nm Pulmonary Perf And Vent  12/31/2014  CLINICAL DATA:  Hypoxia EXAM: NUCLEAR MEDICINE VENTILATION - PERFUSION LUNG SCAN TECHNIQUE: Ventilation images were obtained in multiple projections using inhaled aerosol Tc-47m DTPA. Perfusion images were obtained in multiple projections after intravenous injection of Tc-74m MAA. RADIOPHARMACEUTICALS:  32 millicuries [id number]-92E DTPA aerosol inhalation and 4.3 millicuries of QASTMHDQQI-29N MAA IV COMPARISON:  Chest x-ray 12/30/2014 FINDINGS: Ventilation: Patchy perfusion defects within the lungs. Perfusion: Matching patchy perfusion defects throughout the lungs. No segmental or subsegmental defects. IMPRESSION: Low probability study for pulmonary embolus. Electronically Signed   By: Lennette Bihari  [place] M.D.   On: 12/31/2014 12:59   Mr Attempted Daymon Larsen Report  12/31/2014  This examination belongs to an outside facility and is stored here for comparison purposes only.  Contact the originating outside institution for any associated report or  interpretation.    Medical Consultants:    None.  Anti-Infectives:   Anti-infectives    None      Subjective:   Benjamin Kaiser is awake and alert.  Denies dyspnea, cough.  Says he has not been ambulatory for several months, related to his "neuropathy".  Bowels moved last night.    Objective:    Filed Vitals:   01/02/15 0000 01/02/15 0200 01/02/15 0400 01/02/15 0800  BP: 120/65 115/70 158/68   Pulse: 53 49 54   Temp: 97.6 F (36.4 C)  98 F (36.7 C) 98 F (36.7 C)  TempSrc: Oral  Oral Oral  Resp: 15 15 13    Height:      Weight:   150.141 kg (331 lb)   SpO2: 97% 96% 94%     Intake/Output Summary (Last 24 hours) at 01/02/15 0849 Last data filed at 01/02/15 0400  Gross per 24 hour  Intake    800 ml  Output    200 ml  Net    600 ml   Filed Weights   12/30/14 2133 01/01/15 1430 01/02/15 0400  Weight: 157.852 kg (348 lb) 151.048 kg (333 lb) 150.141 kg (331 lb)    Exam: Gen:  Awake/alert Cardiovascular:  RRR, No M/R/G Respiratory:  Lungs diminished throughout Gastrointestinal:  Abdomen soft, NT/ND, + BS Extremities:  1+ edema   Data Reviewed:    Labs: Basic Metabolic Panel:  Recent Labs Lab 12/30/14 1547 12/31/14 0429 01/01/15 0535  NA 143 143 140  K 3.3* 3.2* 3.9  CL 102 101 100*  CO2 34* 34* 35*  GLUCOSE 116* 122* 109*  BUN 20 20 18   CREATININE 1.43* 1.34* 1.35*  CALCIUM 8.8* 8.6* 8.4*   GFR Estimated Creatinine Clearance: 69.8 mL/min (by C-G formula based on Cr of 1.35). Liver Function Tests:  Recent Labs Lab 12/30/14 1547 12/31/14 0429  AST 15 13*  ALT 12* 11*  ALKPHOS 58 52  BILITOT 0.7 0.7  PROT 6.7 5.9*  ALBUMIN 3.0* 2.7*   CBC:  Recent Labs Lab 12/30/14 1547 12/31/14 0429  WBC 12.1* 11.4*  NEUTROABS 9.1*  --   HGB 13.9 13.1  HCT 43.6 42.9  MCV 87.6 90.1  PLT 264 240   Cardiac Enzymes:  Recent Labs Lab 12/30/14 1547  TROPONINI <0.03   Microbiology No results found for this or any previous visit (from the  past 240 hour(s)).   Medications:   . aspirin EC  81 mg Oral Daily  . atorvastatin  80 mg Oral q1800  . clopidogrel  75 mg Oral Daily  . enoxaparin (LOVENOX) injection  80 mg Subcutaneous Q24H  . ezetimibe  10 mg Oral Daily  . furosemide  40 mg Oral Daily  . gabapentin  600 mg Oral TID  . isosorbide mononitrate  30 mg Oral Daily  . multivitamin  1 tablet Oral Daily  . pantoprazole  40 mg Oral Daily  . potassium chloride  20 mEq Oral BID  . potassium chloride  40 mEq Oral Once  . sodium chloride  3 mL Intravenous Q12H  . tamsulosin  0.4 mg Oral Daily   Continuous Infusions:   Time spent: 35 minutes.  The patient is medically complex with multiple co-morbidities and is  at high risk for clinical deterioration and requires high complexity decision making.     [facility] Pager [phone]. If unable to reach me by pager, please call my cell phone at [phone].  *Please refer to [email or web address], password [email or web address] to get updated schedule on who will round on this patient, as hospitalists switch teams weekly. If 7PM-7AM, please contact night-coverage at [email or web address], password [email or web address] for any overnight needs.  01/02/2015, 8:49 AM

## 2015-01-02 NOTE — Progress Notes (Signed)
PCCM PROGRESS NOTE  ADMISSION DATE: 12/30/2014 CONSULT DATE: 01/02/2015 REFERRING PROVIDER: Dr. Rockne Menghini  CC: Short of breath  HPI: 80 yo male with hx of stroke and Lt sided weakness was brought to ER because of confusion and shortness of breath.    His wife states that he has not been active due to neuropathy pain in his legs.  He was becoming more sleepy.  He was noted to have low oxygen level.  He had ABG that showed hypercapnia.  He was started on BiPAP with improvement in his mental status.  He currently denies chest pain, dyspnea, chest congestion, abdominal pain.    His wife says he snores, and he breathing gets very shallow while asleep.  He was to have sleep study done as outpt, but was not able to complete this due to various health related issues.  PAST MEDICAL HISTORY: He  has a past medical history of Hypertension; Seizure disorder ([place]); Hyperlipidemia; Bradycardia; Gout; Obesity; DVT (deep venous thrombosis) ([place]) (2003); Bladder calculi; BPH (benign prostatic hypertrophy); History of stroke; CAD (coronary artery disease); Chronic diastolic heart failure ([place]); Aortic stenosis; GERD (gastroesophageal reflux disease); Neuropathy ([place]); Stroke [facility]); and Stage III chronic kidney disease (01/01/2015).  PAST SURGICAL HISTORY: He  has past surgical history that includes Transurethral resection of prostate and cataract surgery.  FAMILY HISTORY: His family history includes Fainting in his brother; Heart attack in his son and son; Lung cancer in his father; Stroke in his son; Suicidality in his son.  SOCIAL HISTORY: He  reports that he has never smoked. He quit smokeless tobacco use about 25 years ago. His smokeless tobacco use included Chew. He reports that he does not drink alcohol or use illicit drugs.   Allergies  Allergen Reactions  . Niacin And Related     Occasional flushing, itching    Current Outpatient Prescriptions on File Prior to Encounter  Medication Sig  .  atorvastatin (LIPITOR) 80 MG tablet Take 1 tablet by mouth  daily  . clopidogrel (PLAVIX) 75 MG tablet Take 1 tablet by mouth  daily  . furosemide (LASIX) 40 MG tablet Take 1 tablet by mouth  daily (Patient taking differently: Take 1 tablet in the morning and 0.5 tablet at lunch time)  . gabapentin (NEURONTIN) 300 MG capsule Take 2 capsules (600 mg total) by mouth 3 (three) times daily.  . isosorbide mononitrate (IMDUR) 30 MG 24 hr tablet Take 1 tablet by mouth  daily  . metoprolol succinate (TOPROL-XL) 50 MG 24 hr tablet Take 1 tablet by mouth  daily . Take with or  immediately following a  meal. (Patient taking differently: Take 0.5 tablet by mouth daily . Take with or  immediately following a  meal.)  . Multiple Vitamins-Minerals (EYE VITAMINS) CAPS Take 1 capsule by mouth daily.    . nitroGLYCERIN (NITROSTAT) 0.4 MG SL tablet Place 1 tablet (0.4 mg total) under the tongue every 5 (five) minutes as needed.  . pantoprazole (PROTONIX) 40 MG tablet Take 1 tablet (40 mg total) by mouth daily.  . Tamsulosin HCl (FLOMAX) 0.4 MG CAPS Take 0.4 mg by mouth daily.  Marland Kitchen ZETIA 10 MG tablet Take 1 tablet by mouth  daily  . citalopram (CELEXA) 20 MG tablet Take 1 tablet (20 mg total) by mouth daily. (Patient not taking: Reported on 12/30/2014)   ROS: Negative except above.  SUBJECTIVE: Breathing better.  OBJECTIVE: BP 121/70 mmHg  Pulse 58  Temp(Src) 98 F (36.7 C) (Oral)  Resp 17  Ht  6\' 2"  (1.88 m)  Wt 331 lb (150.141 kg)  BMI 42.48 kg/m2  SpO2 95%  I/O last 3 completed shifts: In: 27 [P.O.:800; I.V.:3] Out: 600 [Urine:600]  General: pleasant HEENT: MP 4 Cardiac: regular, no murmur Chest: decreased BS, no wheezing Abd: soft, non tender Ext: no edema Neuro: decreased strength Lt side Skin: sacral wound   CMP Latest Ref Rng 01/01/2015 12/31/2014 12/30/2014  Glucose 65 - 99 mg/dL 109(H) 122(H) 116(H)  BUN 6 - 20 mg/dL 18 20 20   Creatinine 0.61 - 1.24 mg/dL 1.35(H) 1.34(H) 1.43(H)   Sodium 135 - 145 mmol/L 140 143 143  Potassium 3.5 - 5.1 mmol/L 3.9 3.2(L) 3.3(L)  Chloride 101 - 111 mmol/L 100(L) 101 102  CO2 22 - 32 mmol/L 35(H) 34(H) 34(H)  Calcium 8.9 - 10.3 mg/dL 8.4(L) 8.6(L) 8.8(L)  Total Protein 6.5 - 8.1 g/dL - 5.9(L) 6.7  Total Bilirubin 0.3 - 1.2 mg/dL - 0.7 0.7  Alkaline Phos 38 - 126 U/L - 52 58  AST 15 - 41 U/L - 13(L) 15  ALT 17 - 63 U/L - 11(L) 12(L)     CBC Latest Ref Rng 12/31/2014 12/30/2014 04/21/2014  WBC 4.0 - 10.5 K/uL 11.4(H) 12.1(H) 11.1(H)  Hemoglobin 13.0 - 17.0 g/dL 13.1 13.9 14.0  Hematocrit 39.0 - 52.0 % 42.9 43.6 41.8  Platelets 150 - 400 K/uL 240 264 237.0    ABG    Component Value Date/Time   PHART 7.365 01/01/2015 1235   PCO2ART 59.0* 01/01/2015 1235   PO2ART 70.3* 01/01/2015 1235   HCO3 32.8* 01/01/2015 1235   TCO2 29.8 01/01/2015 1235   O2SAT 92.0 01/01/2015 1235    Mr Attempted Study-no Report  12/31/2014  This examination belongs to an outside facility and is stored here for comparison purposes only.  Contact the originating outside institution for any associated report or interpretation.   STUDIES: 11/03 CT head >> remote Rt parietal CVA 11/03 CT chest >> basilar ATX, atherosclerosis 11/04 Echo >> mild LVH, EF 55 to 07%, grade 1 diastolic dysfx, mild AS 86/75 Doppler legs >> no DVT 11/04 V/Q scan >> low probability for PE  EVENTS: 11/03 Admit  DISCUSSION: 80 yo male with prior hx of CVA presented with altered mental status and hypoxia likely from OSA/OHS in setting of morbid obesity.  ASSESSMENT/PLAN:  Acute on chronic hypoxic/hypercapnic respiratory failure likely from OSA/OHS. Plan: - oxygen to keep SpO2 89 to 95% - change to auto CPAP qhs >> will be easy to get this set up as outpt - will need to set up sleep study as outpt  CAD, chronic diastolic dysfx, HTN, HLD, recent CVA, Seizures, Gout, Hx DVT, GERD, Stage III CKD, BPH, neuropathy. Plan: - per primary team  D/w Dr. Rockne Menghini.  Updated pt's  family at bedside.  Chesley Mires, MD [facility] Pulmonary/Critical Care 01/02/2015, 2:15 PM Pager:  [phone] After 3pm call: [phone]

## 2015-01-02 NOTE — Care Management Obs Status (Signed)
MEDICARE OBSERVATION STATUS NOTIFICATION   Patient Details  Name: Benjamin Kaiser MRN: [id number] Date of Birth: Jul 07, 1934   Medicare Observation Status Notification Given:  Yes    Apolonio Schneiders, RN 01/02/2015, 10:16 AM

## 2015-01-02 NOTE — Progress Notes (Signed)
Pt asleep, no distress noted, HR51, rr19, spo2 95% on 2lnc.  Bipap not indicated at this time, but remains in room on standby.  RT will continue to monitor and assess as needed.

## 2015-01-03 ENCOUNTER — Other Ambulatory Visit: Payer: Self-pay | Admitting: Cardiology

## 2015-01-03 DIAGNOSIS — J9620 Acute and chronic respiratory failure, unspecified whether with hypoxia or hypercapnia: Secondary | ICD-10-CM

## 2015-01-03 DIAGNOSIS — Z6841 Body Mass Index (BMI) 40.0 and over, adult: Secondary | ICD-10-CM

## 2015-01-03 DIAGNOSIS — G9340 Encephalopathy, unspecified: Secondary | ICD-10-CM

## 2015-01-03 LAB — BLOOD GAS, ARTERIAL
Acid-Base Excess: 7.1 mmol/L — ABNORMAL HIGH (ref 0.0–2.0)
BICARBONATE: 32.4 meq/L — AB (ref 20.0–24.0)
DRAWN BY: 307971
O2 CONTENT: 2 L/min
O2 Saturation: 92.6 %
PCO2 ART: 50.5 mmHg — AB (ref 35.0–45.0)
PH ART: 7.423 (ref 7.350–7.450)
PO2 ART: 69.5 mmHg — AB (ref 80.0–100.0)
Patient temperature: 98.6
TCO2: 28.8 mmol/L (ref 0–100)

## 2015-01-03 NOTE — Evaluation (Signed)
Occupational Therapy Evaluation Patient Details Name: Benjamin Kaiser MRN: [id number] DOB: 20-Nov-1934 Today's Date: 01/03/2015    History of Present Illness 80 yo male admitted with respiratory failure. Hx of HTN, Sz, gout, DVt, CVA, CAD, HF, obesity.    Clinical Impression   Pt admitted with above. He demonstrates the below listed deficits and will benefit from continued OT to maximize safety and independence with BADLs.  Pt currently requires total A for ADLs and functional mobility.   Per chart, pt was bedbound PTA and plan is to return home with wife.  Will see acutely, and recommend HHOT.      Follow Up Recommendations  Home health OT;Supervision/Assistance - 24 hour    Equipment Recommendations  None recommended by OT    Recommendations for Other Services       Precautions / Restrictions Precautions Precautions: Fall      Mobility Bed Mobility Overal bed mobility: Needs Assistance Bed Mobility: Rolling Rolling: Max assist;Total assist            Transfers                      Balance                                            ADL Overall ADL's : Needs assistance/impaired Eating/Feeding: Moderate assistance;Bed level   Grooming: Wash/dry hands;Wash/dry face;Oral care;Applying deodorant;Brushing hair;Moderate assistance;Bed level   Upper Body Bathing: Maximal assistance;Bed level   Lower Body Bathing: Total assistance;Bed level   Upper Body Dressing : Total assistance;Bed level   Lower Body Dressing: Total assistance;Bed level   Toilet Transfer: Total assistance Toilet Transfer Details (indicate cue type and reason): unable  Toileting- Clothing Manipulation and Hygiene: Total assistance;Bed level       Functional mobility during ADLs: Total assistance General ADL Comments: Pt fatigues quickly      Vision Additional Comments: not assessed    Perception     Praxis Praxis Praxis tested?: Deficits Deficits:  Initiation    Pertinent Vitals/Pain Pain Assessment: No/denies pain     Hand Dominance Right   Extremity/Trunk Assessment Upper Extremity Assessment Upper Extremity Assessment: Generalized weakness;RUE deficits/detail RUE Deficits / Details: grossly 3-/5   Lower Extremity Assessment Lower Extremity Assessment: Defer to PT evaluation       Communication Communication Communication: HOH   Cognition Arousal/Alertness: Lethargic Behavior During Therapy: WFL for tasks assessed/performed;Flat affect Overall Cognitive Status: No family/caregiver present to determine baseline cognitive functioning                     General Comments       Exercises       Shoulder Instructions      Home Living Family/patient expects to be discharged to:: Private residence Living Arrangements: Spouse/significant other   Type of Home: House                       Home Equipment: Wheelchair - power;Hospital bed;Walker - 2 wheels;Bedside commode          Prior Functioning/Environment Level of Independence: Needs assistance  Gait / Transfers Assistance Needed: non ambulatory ADL's / Homemaking Assistance Needed: total assist   Comments: Pt indicates he scooted to w/c, but unsure if this is accurage.  Per chart pt was bed bound     OT  Diagnosis: Generalized weakness;Cognitive deficits   OT Problem List: Decreased strength;Decreased activity tolerance;Impaired balance (sitting and/or standing);Decreased safety awareness;Decreased knowledge of use of DME or AE;Cardiopulmonary status limiting activity;Obesity;Impaired UE functional use   OT Treatment/Interventions: Self-care/ADL training;Therapeutic exercise;Neuromuscular education;DME and/or AE instruction;Therapeutic activities;Patient/family education;Balance training    OT Goals(Current goals can be found in the care plan section) Acute Rehab OT Goals Patient Stated Goal: to return home OT Goal Formulation: With  patient Time For Goal Achievement: 01/17/15 Potential to Achieve Goals: Fair ADL Goals Pt Will Perform Eating: with set-up;bed level Pt Will Perform Grooming: with set-up;bed level Pt Will Perform Upper Body Bathing: with mod assist;bed level Pt/caregiver will Perform Home Exercise Program: Increased ROM;Increased strength;Right Upper extremity;Left upper extremity;With minimal assist;With written HEP provided Additional ADL Goal #1: Pt will sit EOB x 10 mins with max A   OT Frequency: Min 2X/week   Barriers to D/C:            Co-evaluation              End of Session Nurse Communication: Mobility status  Activity Tolerance: Patient limited by fatigue Patient left: in bed;with call bell/phone within reach;with bed alarm set   Time: 5462-7035 OT Time Calculation (min): 12 min Charges:  OT General Charges $OT Visit: 1 Procedure OT Evaluation $Initial OT Evaluation Tier I: 1 Procedure G-Codes:    Lucille Passy M January 09, 2015, 5:42 PM

## 2015-01-03 NOTE — Progress Notes (Signed)
[facility]  Patient Status: Active (receiving services up to time of hospitalization)  [facility] is providing the following services: RN, PT and HHA  If patient discharges after hours, please call [phone].   Benjamin Kaiser 01/03/2015, 9:28 AM

## 2015-01-03 NOTE — Progress Notes (Signed)
PCCM PROGRESS NOTE  ADMISSION DATE: 12/30/2014 CONSULT DATE: 01/02/2015 REFERRING PROVIDER: Dr. Rockne Menghini  CC: Short of breath  HPI: 80 yo male with hx of stroke and Lt sided weakness was brought to ER because of confusion and shortness of breath.    His wife states that he has not been active due to neuropathy pain in his legs.  He was becoming more sleepy.  He was noted to have low oxygen level.  He had ABG that showed hypercapnia.  He was started on BiPAP with improvement in his mental status.   His wife says he snores, and he breathing gets very shallow while asleep.  He was to have sleep study done as outpt, but was not able to complete this due to various health related issues.  ROS: He currently denies chest pain, dyspnea, chest congestion, abdominal pain.    SUBJECTIVE: Confused this am & some overnight Breathing better. Did not use cpap overnight -pulled mask off several times within the past two hours   OBJECTIVE: BP 134/67 mmHg  Pulse 61  Temp(Src) 98 F (36.7 C) (Oral)  Resp 14  Ht 6\' 2"  (1.88 m)  Wt 152.409 kg (336 lb)  BMI 43.12 kg/m2  SpO2 93%  I/O last 3 completed shifts: In: 440 [P.O.:440] Out: 1700 [Urine:1700]  General: pleasant, chr ill appearing HEENT: MP 4, beard Cardiac: regular, no murmur Chest: decreased BS, no wheezing Abd: soft, non tender Ext: no edema Neuro: decreased strength Lt side, confused, follows 1 step commands Skin: sacral wound   CMP Latest Ref Rng 01/01/2015 12/31/2014 12/30/2014  Glucose 65 - 99 mg/dL 109(H) 122(H) 116(H)  BUN 6 - 20 mg/dL 18 20 20   Creatinine 0.61 - 1.24 mg/dL 1.35(H) 1.34(H) 1.43(H)  Sodium 135 - 145 mmol/L 140 143 143  Potassium 3.5 - 5.1 mmol/L 3.9 3.2(L) 3.3(L)  Chloride 101 - 111 mmol/L 100(L) 101 102  CO2 22 - 32 mmol/L 35(H) 34(H) 34(H)  Calcium 8.9 - 10.3 mg/dL 8.4(L) 8.6(L) 8.8(L)  Total Protein 6.5 - 8.1 g/dL - 5.9(L) 6.7  Total Bilirubin 0.3 - 1.2 mg/dL - 0.7 0.7  Alkaline Phos 38 - 126 U/L - 52  58  AST 15 - 41 U/L - 13(L) 15  ALT 17 - 63 U/L - 11(L) 12(L)     CBC Latest Ref Rng 12/31/2014 12/30/2014 04/21/2014  WBC 4.0 - 10.5 K/uL 11.4(H) 12.1(H) 11.1(H)  Hemoglobin 13.0 - 17.0 g/dL 13.1 13.9 14.0  Hematocrit 39.0 - 52.0 % 42.9 43.6 41.8  Platelets 150 - 400 K/uL 240 264 237.0    ABG    Component Value Date/Time   PHART 7.365 01/01/2015 1235   PCO2ART 59.0* 01/01/2015 1235   PO2ART 70.3* 01/01/2015 1235   HCO3 32.8* 01/01/2015 1235   TCO2 29.8 01/01/2015 1235   O2SAT 92.0 01/01/2015 1235    No results found.  STUDIES: 11/03 CT head >> remote Rt parietal CVA 11/03 CT chest >> basilar ATX, atherosclerosis 11/04 Echo >> mild LVH, EF 55 to 56%, grade 1 diastolic dysfx, mild AS 43/32 Doppler legs >> no DVT 11/04 V/Q scan >> low probability for PE  EVENTS: 11/03 Admit  DISCUSSION: 80 yo male with prior hx of CVA presented with altered mental status and hypoxia likely from OSA/OHS in setting of morbid obesity.  ASSESSMENT/PLAN:  Acute on chronic hypoxic/hypercapnic respiratory failure likely from OSA/OHS. Plan: - oxygen to keep SpO2 89 to 95% - change to auto CPAP qhs >> will be easy to get this  set up as outpt - will need to set up sleep study as outpt -suggest keep in sdu & ensure cpap compliance   Acute encephalopathy - related to above + chronic medical issues -rechk ABG now  CAD, chronic diastolic dysfx, HTN, HLD, recent CVA, Seizures, Gout, Hx DVT, GERD, Stage III CKD, BPH, neuropathy. Plan: - per primary team  D/w Dr. Rockne Menghini.  Kara Mead MD. [facility] Flood. [place] [facility] Pager [phone] If no response call [phone]     01/03/2015, 9:35 AM

## 2015-01-03 NOTE — Progress Notes (Signed)
Pt is unable to tolerate cpap and has pulled mask off several times within the past two hours.   2l [place] reapplied, rn advised.  HR72, rr16, spo2 95%.

## 2015-01-03 NOTE — Progress Notes (Signed)
Progress Note   Benjamin Kaiser:[id number] DOB: Jul 22, 1934 DOA: 12/30/2014 PCP: Leonard Downing, MD   Brief Narrative:   Benjamin Kaiser is an 80 y.o. male with a PMH of hypertension, seizure disorder, hyperlipidemia, DVT, CVA, CAD and chronic diastolic CHF who was admitted 12/30/14 with a two-week history of altered mental status including increased somnolence in the setting of hypoxia with oxygen saturations in the mid 80s.  Assessment/Plan:   Principal Problem:   Acute on chronic respiratory failure with hypoxia and hypercarbia / acute encephalopathy secondary to CO2 narcosis - VQ scan done 12/31/14: Low probability for PE. Lower extremity Dopplers negative for DVT. - 2-D echo done 12/31/14: EF 55-60 percent, grade 1 diastolic dysfunction noted. - Acute encephalopathy appears to be secondary to CO2 narcosis, confirmed by ABG done 01/01/15, resolved with BiPAP.  - Underlying etiology is likely obesity hypoventilation syndrome and obstructive sleep apnea.  - Will need sleep study and home C Pap. Pulmonology consultation done 01/02/15. - Unable to tolerate C Pap last night.  Await further recommendations from pulmonologist.  Active Problems:   Functional quadriplegia ([place]) / Hereditary and idiopathic peripheral neuropathy ([place]) / Morbid obesity - Non-ambulatory for the past 3-4 months. - Evaluated by PT, home PT recommended.  Dietician consult to address morbid obesity pending. - Continue Neurontin.    Stage III chronic kidney disease - Baseline creatinine 1.3 with a GFR less than 60, consistent with chronic kidney disease. - Creatinine consistent with usual baseline values.    CAD (coronary artery disease)/Diastolic CHF, chronic (HCC)/aortic stenosis - No significant findings on echocardiogram. Troponin negative. - Continue aspirin, Plavix and Imdur. - Monitoring on telemetry.    Hyperlipidemia - Continue statin and Zetia.    Hypertension - Continue Lasix, Imdur.   CVA (cerebral vascular accident) ([place]) - Continue risk factor modification. Continue aspirin/Plavix, statin.    Hypokalemia - Repleted.    Pressure ulcer - Wound care RN consultation. Bariatric air mattress.    DVT Prophylaxis - Lovenox ordered.   Family Communication/Anticipated D/C date and plan/Code Status   Family Communication: Wife updated at bedside 01/02/15. Disposition Plan: Home when cleared by pulmonology. Anticipated D/C date:   1-2 days, pending clearance/plan by pulmonology. Code Status:     Code Status Orders        Start     Ordered   12/30/14 2135  Full code   Continuous     12/30/14 2134    Advance Directive Documentation        Most Recent Value   Type of Advance Directive  Healthcare Power of Attorney, Living will   Pre-existing out of facility DNR order (yellow form or pink MOST form)     "MOST" Form in Place?         IV Access:    Peripheral IV   Procedures and diagnostic studies:   Nm Pulmonary Perf And Vent  12/31/2014  CLINICAL DATA:  Hypoxia EXAM: NUCLEAR MEDICINE VENTILATION - PERFUSION LUNG SCAN TECHNIQUE: Ventilation images were obtained in multiple projections using inhaled aerosol Tc-19m DTPA. Perfusion images were obtained in multiple projections after intravenous injection of Tc-66m MAA. RADIOPHARMACEUTICALS:  32 millicuries [id number] DTPA aerosol inhalation and 4.3 millicuries of [id number] MAA IV COMPARISON:  Chest x-ray 12/30/2014 FINDINGS: Ventilation: Patchy perfusion defects within the lungs. Perfusion: Matching patchy perfusion defects throughout the lungs. No segmental or subsegmental defects. IMPRESSION: Low probability study for pulmonary embolus. Electronically Signed   By: Rolm Baptise M.D.  On: 12/31/2014 12:59   Mr Attempted Daymon Larsen Report  12/31/2014  This examination belongs to an outside facility and is stored here for comparison purposes only.  Contact the originating outside institution for any associated  report or interpretation.    Medical Consultants:    Dr. Chesley Mires, Pulmonology  Anti-Infectives:   Anti-infectives    None      Subjective:   [name] is more somnolent this morning.  Unable to tolerated CPAP last night.    Objective:    Filed Vitals:   01/03/15 0235 01/03/15 0350 01/03/15 0620 01/03/15 0639  BP: 141/98   134/67  Pulse: 81   61  Temp:  98 F (36.7 C)    TempSrc:  Oral    Resp: 16   14  Height:      Weight:   152.409 kg (336 lb)   SpO2: 94%   93%    Intake/Output Summary (Last 24 hours) at 01/03/15 0748 Last data filed at 01/03/15 0300  Gross per 24 hour  Intake      0 ml  Output   1500 ml  Net  -1500 ml   Filed Weights   01/01/15 1430 01/02/15 0400 01/03/15 0620  Weight: 151.048 kg (333 lb) 150.141 kg (331 lb) 152.409 kg (336 lb)    Exam: Gen:  Somnolent Cardiovascular:  RRR, No M/R/G Respiratory:  Lungs diminished throughout Gastrointestinal:  Abdomen soft, NT/ND, + BS Extremities:  1+ edema   Data Reviewed:    Labs: Basic Metabolic Panel:  Recent Labs Lab 12/30/14 1547 12/31/14 0429 01/01/15 0535  NA 143 143 140  K 3.3* 3.2* 3.9  CL 102 101 100*  CO2 34* 34* 35*  GLUCOSE 116* 122* 109*  BUN 20 20 18   CREATININE 1.43* 1.34* 1.35*  CALCIUM 8.8* 8.6* 8.4*   GFR Estimated Creatinine Clearance: 70.4 mL/min (by C-G formula based on Cr of 1.35). Liver Function Tests:  Recent Labs Lab 12/30/14 1547 12/31/14 0429  AST 15 13*  ALT 12* 11*  ALKPHOS 58 52  BILITOT 0.7 0.7  PROT 6.7 5.9*  ALBUMIN 3.0* 2.7*   CBC:  Recent Labs Lab 12/30/14 1547 12/31/14 0429  WBC 12.1* 11.4*  NEUTROABS 9.1*  --   HGB 13.9 13.1  HCT 43.6 42.9  MCV 87.6 90.1  PLT 264 240   Cardiac Enzymes:  Recent Labs Lab 12/30/14 1547  TROPONINI <0.03   Microbiology Recent Results (from the past 240 hour(s))  MRSA PCR Screening     Status: None   Collection Time: 01/02/15 11:00 AM  Result Value Ref Range Status   MRSA by  PCR NEGATIVE NEGATIVE Final    Comment:        The GeneXpert MRSA Assay (FDA approved for NASAL specimens only), is one component of a comprehensive MRSA colonization surveillance program. It is not intended to diagnose MRSA infection nor to guide or monitor treatment for MRSA infections.      Medications:   . aspirin EC  81 mg Oral Daily  . atorvastatin  80 mg Oral q1800  . clopidogrel  75 mg Oral Daily  . enoxaparin (LOVENOX) injection  80 mg Subcutaneous Q24H  . ezetimibe  10 mg Oral Daily  . furosemide  40 mg Oral Daily  . gabapentin  600 mg Oral TID  . isosorbide mononitrate  30 mg Oral Daily  . multivitamin  1 tablet Oral Daily  . pantoprazole  40 mg Oral Daily  . potassium chloride  20 mEq Oral BID  . potassium chloride  40 mEq Oral Once  . sodium chloride  3 mL Intravenous Q12H  . tamsulosin  0.4 mg Oral Daily   Continuous Infusions:   Time spent: 35 minutes.  The patient is medically complex with multiple co-morbidities and is at high risk for clinical deterioration and requires high complexity decision making. Remains at the SDU level of care.   LOS: 1 day   RAMA,CHRISTINA  [facility] Pager [phone]. If unable to reach me by pager, please call my cell phone at [phone].  *Please refer to [email or web address], password [email or web address] to get updated schedule on who will round on this patient, as hospitalists switch teams weekly. If 7PM-7AM, please contact night-coverage at [email or web address], password [email or web address] for any overnight needs.  01/03/2015, 7:48 AM

## 2015-01-03 NOTE — Progress Notes (Signed)
Desaturations noted with excessive leak from mask. When tightened, patient complains of being "too tight". Loosened some, some leakage continues with O2 saturations ranging mid 80's to 90 percent. Tightened mask as much as patient would allow and increased O2 flow to 6L. RN aware.

## 2015-01-03 NOTE — Progress Notes (Signed)
Nutrition Note  RD consulted to provide diet education for patient who is morbidly obese. Pt is confused this AM. RD to provide education once mental status improves.  Wt Readings from Last 15 Encounters:  01/03/15 336 lb (152.409 kg)  04/21/14 368 lb (166.924 kg)  04/08/13 360 lb (163.295 kg)  03/10/13 369 lb (167.377 kg)  10/06/12 344 lb (156.037 kg)  07/31/12 328 lb 6.4 oz (148.961 kg)  05/23/12 326 lb (147.873 kg)  04/28/12 328 lb (148.78 kg)  03/19/12 332 lb 12.8 oz (150.957 kg)  03/04/12 339 lb 6.4 oz (153.951 kg)  11/15/11 344 lb 1.9 oz (156.092 kg)  07/16/11 350 lb (158.759 kg)  06/07/11 350 lb 15.6 oz (159.2 kg)  05/07/11 357 lb (161.934 kg)  02/07/11 325 lb (147.419 kg)    Body mass index is 43.12 kg/(m^2). Patient meets criteria for morbid obesity based on current BMI.   Labs and medications reviewed.   [name], MS, RD, LDN Pager: [phone] After Hours Pager: [phone]

## 2015-01-03 NOTE — Progress Notes (Signed)
Patient complains of mask fitting too tight, despite the leak. Education provided and he complains of air blowing in his eyes. Mask readjusted at this time, per patient request. Good seal obtained. Tolerating well at this time, but complains of not wanting to wear it "all night". RN aware.

## 2015-01-03 NOTE — Progress Notes (Addendum)
Date: January 03, 2015 Chart reviewed for concurrent status and case management needs. Will continue to follow patient for changes and needs: cpap changed to qhs/was on bipap Velva Harman, RN, BSN, [place]   [phone]

## 2015-01-04 LAB — BLOOD GAS, ARTERIAL
Acid-Base Excess: 6.3 mmol/L — ABNORMAL HIGH (ref 0.0–2.0)
Bicarbonate: 31.4 meq/L — ABNORMAL HIGH (ref 20.0–24.0)
Delivery systems: POSITIVE
O2 Content: 3 L/min
O2 Saturation: 89.8 %
Patient temperature: 98.6
TCO2: 28.3 mmol/L (ref 0–100)
pCO2 arterial: 49.4 mmHg — ABNORMAL HIGH (ref 35.0–45.0)
pH, Arterial: 7.42 (ref 7.350–7.450)
pO2, Arterial: 61.6 mmHg — ABNORMAL LOW (ref 80.0–100.0)

## 2015-01-04 NOTE — Progress Notes (Signed)
PCCM PROGRESS NOTE  ADMISSION DATE: 12/30/2014 CONSULT DATE: 01/02/2015 REFERRING PROVIDER: Dr. Rockne Menghini  CC: Short of breath  HPI: 80 yo male with hx of stroke and Lt sided weakness was brought to ER because of confusion and shortness of breath.    His wife states that he has not been active due to neuropathy pain in his legs.  He was becoming more sleepy.  He was noted to have low oxygen level.  He had ABG that showed hypercapnia.  He was started on BiPAP with improvement in his mental status.   His wife says he snores, and he breathing gets very shallow while asleep.  He was to have sleep study done as outpt, but was not able to complete this due to various health related issues.  ROS: He currently denies chest pain, dyspnea, chest congestion, abdominal pain.    SUBJECTIVE: Breathing better. Tolerated cpap better -had it on by am rounds   OBJECTIVE: BP 113/68 mmHg  Pulse 57  Temp(Src) 97.2 F (36.2 C) (Axillary)  Resp 14  Ht 6\' 2"  (1.88 m)  Wt 154.677 kg (341 lb)  BMI 43.76 kg/m2  SpO2 94%  I/O last 3 completed shifts: In: -  Out: 775 [Urine:775]  General: pleasant, chr ill appearing HEENT: MP 4, beard Cardiac: regular, no murmur Chest: decreased BS, no wheezing Abd: soft, non tender Ext: no edema Neuro: decreased strength Lt side, confused, follows 1 step commands Skin: sacral wound   CMP Latest Ref Rng 01/01/2015 12/31/2014 12/30/2014  Glucose 65 - 99 mg/dL 109(H) 122(H) 116(H)  BUN 6 - 20 mg/dL 18 20 20   Creatinine 0.61 - 1.24 mg/dL 1.35(H) 1.34(H) 1.43(H)  Sodium 135 - 145 mmol/L 140 143 143  Potassium 3.5 - 5.1 mmol/L 3.9 3.2(L) 3.3(L)  Chloride 101 - 111 mmol/L 100(L) 101 102  CO2 22 - 32 mmol/L 35(H) 34(H) 34(H)  Calcium 8.9 - 10.3 mg/dL 8.4(L) 8.6(L) 8.8(L)  Total Protein 6.5 - 8.1 g/dL - 5.9(L) 6.7  Total Bilirubin 0.3 - 1.2 mg/dL - 0.7 0.7  Alkaline Phos 38 - 126 U/L - 52 58  AST 15 - 41 U/L - 13(L) 15  ALT 17 - 63 U/L - 11(L) 12(L)     CBC Latest  Ref Rng 12/31/2014 12/30/2014 04/21/2014  WBC 4.0 - 10.5 K/uL 11.4(H) 12.1(H) 11.1(H)  Hemoglobin 13.0 - 17.0 g/dL 13.1 13.9 14.0  Hematocrit 39.0 - 52.0 % 42.9 43.6 41.8  Platelets 150 - 400 K/uL 240 264 237.0    ABG    Component Value Date/Time   PHART 7.420 01/04/2015 0420   PCO2ART 49.4* 01/04/2015 0420   PO2ART 61.6* 01/04/2015 0420   HCO3 31.4* 01/04/2015 0420   TCO2 28.3 01/04/2015 0420   O2SAT 89.8 01/04/2015 0420    No results found.  STUDIES: 11/03 CT head >> remote Rt parietal CVA 11/03 CT chest >> basilar ATX, atherosclerosis 11/04 Echo >> mild LVH, EF 55 to 88%, grade 1 diastolic dysfx, mild AS 91/69 Doppler legs >> no DVT 11/04 V/Q scan >> low probability for PE  EVENTS: 11/03 Admit  DISCUSSION: 80 yo male with prior hx of CVA presented with altered mental status and hypoxia likely from OSA/OHS in setting of morbid obesity.  ASSESSMENT/PLAN:  Acute on chronic hypoxic/hypercapnic respiratory failure likely from OSA/OHS. Plan: - oxygen to keep SpO2 89 to 95% - change to auto CPAP qhs >> will be easy to get this set up as outpt - will need to set up sleep study  as outpt   Acute encephalopathy - related to above + chronic medical issues/ priro CVA -resolved, more somnolent this am prob due to trazodone  CAD, chronic diastolic dysfx, HTN, HLD, recent CVA, Seizures, Gout, Hx DVT, GERD, Stage III CKD, BPH, neuropathy. Plan: - per primary team  D/w Dr. Rockne Menghini. Dispo status not clear - would recomm shortt erm rehab to ensure CPAP compliance  PCCM to sign off   Kara Mead MD. Shade Flood. [facility] Pager [phone] If no response call [phone]     01/04/2015, 10:09 AM

## 2015-01-04 NOTE — Progress Notes (Signed)
Wife informed staff that pt was just prescribed trazodone for sleep at night by pcp 2 days prior to admission. Pt never started taking trazodone at home. He has been given 50mg  the past 2 nights and has been lethargic both nights and mornings after. Will inform RN tonight to hold on trazodone per wife. Dr. Elsworth Soho notified. Will continue to monitor.

## 2015-01-04 NOTE — Progress Notes (Signed)
Progress Note   Benjamin Kaiser ASN:[id number] DOB: [birth year] DOA: 12/30/2014 PCP: Leonard Downing, MD   Brief Narrative:   Benjamin Kaiser is an 80 y.o. male with a PMH of hypertension, seizure disorder, hyperlipidemia, DVT, CVA, CAD and chronic diastolic CHF who was admitted 12/30/14 with a two-week history of altered mental status including increased somnolence in the setting of hypoxia with oxygen saturations in the mid 80s, thought to be from OHS/OSA.  Will need an outpatient sleep study and auto CPAP machine for home use at discharge.  Pulmonary team following for assistance.  Assessment/Plan:   Principal Problem:   Acute on chronic respiratory failure with hypoxia and hypercarbia / acute encephalopathy secondary to CO2 narcosis - VQ scan done 12/31/14: Low probability for PE. Lower extremity Dopplers negative for DVT. - 2-D echo done 12/31/14: EF 55-60 percent, grade 1 diastolic dysfunction noted. - Acute encephalopathy appears to be secondary to CO2 narcosis, confirmed by ABG done 01/01/15, resolved with BiPAP.  - Underlying etiology is likely obesity hypoventilation syndrome and obstructive sleep apnea.  - Will need sleep study and home C Pap. Pulmonology consultation done 01/02/15. - Unable to tolerate C Pap 01/02/15. Auto CPAP used last night. Still having difficulty tolerating.  Active Problems:   Functional quadriplegia (HCC) / Hereditary and idiopathic peripheral neuropathy (HCC) / Morbid obesity - Non-ambulatory for the past 3-4 months. - Evaluated by PT, home PT recommended.   - BMI 43.12. Diet education provided by dietitian 01/03/15. - Continue Neurontin.    Stage III chronic kidney disease - Baseline creatinine 1.3 with a GFR less than 60, consistent with chronic kidney disease. - Creatinine consistent with usual baseline values.    CAD (coronary artery disease)/Diastolic CHF, chronic (HCC)/aortic stenosis - No significant findings on echocardiogram. Troponin  negative. - Continue aspirin, Plavix and Imdur. - Monitoring on telemetry.    Hyperlipidemia - Continue statin and Zetia.    Hypertension - Continue Lasix, Imdur.    CVA (cerebral vascular accident) ([place]) - Continue risk factor modification. Continue aspirin/Plavix, statin.    Hypokalemia - Repleted.    Pressure ulcer - Wounds include: MASD moisture associated skin damage to the buttocks, gluteal skin folds. Skin tear upper left chest, axilla.  - Wound care per wound care RN RN recommendations. Bariatric air mattress.    DVT Prophylaxis - Lovenox ordered.   Family Communication/Anticipated D/C date and plan/Code Status   Family Communication: Wife updated at bedside 01/02/15. Disposition Plan: Home when cleared by pulmonology. Anticipated D/C date:   01/05/15, pending clearance/plan by pulmonology.  Still having difficulty tolerating CPAP and is at high risk for re-hospitalization/non-compliance. Code Status:     Code Status Orders        Start     Ordered   12/30/14 2135  Full code   Continuous     12/30/14 2134    Advance Directive Documentation        Most Recent Value   Type of Advance Directive  Healthcare Power of Attorney, Living will   Pre-existing out of facility DNR order (yellow form or pink MOST form)     "MOST" Form in Place?         IV Access:    Peripheral IV   Procedures and diagnostic studies:   Nm Pulmonary Perf And Vent  12/31/2014  CLINICAL DATA:  Hypoxia EXAM: NUCLEAR MEDICINE VENTILATION - PERFUSION LUNG SCAN TECHNIQUE: Ventilation images were obtained in multiple projections using inhaled aerosol Tc-62m DTPA. Perfusion  images were obtained in multiple projections after intravenous injection of Tc-24m MAA. RADIOPHARMACEUTICALS:  32 millicuries [id number]-[id number] DTPA aerosol inhalation and 4.3 millicuries of [id number] MAA IV COMPARISON:  Chest x-ray 12/30/2014 FINDINGS: Ventilation: Patchy perfusion defects within the lungs. Perfusion:  Matching patchy perfusion defects throughout the lungs. No segmental or subsegmental defects. IMPRESSION: Low probability study for pulmonary embolus. Electronically Signed   By: Rolm Baptise M.D.   On: 12/31/2014 12:59   Mr Attempted Daymon Larsen Report  12/31/2014  This examination belongs to an outside facility and is stored here for comparison purposes only.  Contact the originating outside institution for any associated report or interpretation.    Medical Consultants:    Dr. Chesley Mires, Pulmonology  Anti-Infectives:   Anti-infectives    None      Subjective:   Benjamin Kaiser is somnolent this morning.  Still having trouble tolerating the CPAP.  He was given Trazodone last night, and is difficult to awaken this morning.    Objective:    Filed Vitals:   01/04/15 0000 01/04/15 0244 01/04/15 0400 01/04/15 0500  BP: 99/60 152/91 121/72   Pulse: 63 62 58   Temp: 98.6 F (37 C)  99.9 F (37.7 C)   TempSrc: Oral  Axillary   Resp: 17 13 15    Height:      Weight:    154.677 kg (341 lb)  SpO2: 89% 95% 91%     Intake/Output Summary (Last 24 hours) at 01/04/15 0723 Last data filed at 01/03/15 2136  Gross per 24 hour  Intake      0 ml  Output    175 ml  Net   -175 ml   Filed Weights   01/02/15 0400 01/03/15 0620 01/04/15 0500  Weight: 150.141 kg (331 lb) 152.409 kg (336 lb) 154.677 kg (341 lb)    Exam: Gen:  Somnolent, CPAP on Cardiovascular:  RRR, No M/R/G Respiratory:  Lungs diminished throughout Gastrointestinal:  Abdomen soft, NT/ND, + BS Extremities:  1+ edema   Data Reviewed:    Labs: Basic Metabolic Panel:  Recent Labs Lab 12/30/14 1547 12/31/14 0429 01/01/15 0535  NA 143 143 140  K 3.3* 3.2* 3.9  CL 102 101 100*  CO2 34* 34* 35*  GLUCOSE 116* 122* 109*  BUN 20 20 18   CREATININE 1.43* 1.34* 1.35*  CALCIUM 8.8* 8.6* 8.4*   GFR Estimated Creatinine Clearance: 70.9 mL/min (by C-G formula based on Cr of 1.35). Liver Function Tests:  Recent  Labs Lab 12/30/14 1547 12/31/14 0429  AST 15 13*  ALT 12* 11*  ALKPHOS 58 52  BILITOT 0.7 0.7  PROT 6.7 5.9*  ALBUMIN 3.0* 2.7*   CBC:  Recent Labs Lab 12/30/14 1547 12/31/14 0429  WBC 12.1* 11.4*  NEUTROABS 9.1*  --   HGB 13.9 13.1  HCT 43.6 42.9  MCV 87.6 90.1  PLT 264 240   Cardiac Enzymes:  Recent Labs Lab 12/30/14 1547  TROPONINI <0.03   ABG    Component Value Date/Time   PHART 7.420 01/04/2015 0420   PCO2ART 49.4* 01/04/2015 0420   PO2ART 61.6* 01/04/2015 0420   HCO3 31.4* 01/04/2015 0420   TCO2 28.3 01/04/2015 0420   O2SAT 89.8 01/04/2015 0420    Microbiology Recent Results (from the past 240 hour(s))  MRSA PCR Screening     Status: None   Collection Time: 01/02/15 11:00 AM  Result Value Ref Range Status   MRSA by PCR NEGATIVE NEGATIVE Final    Comment:  The GeneXpert MRSA Assay (FDA approved for NASAL specimens only), is one component of a comprehensive MRSA colonization surveillance program. It is not intended to diagnose MRSA infection nor to guide or monitor treatment for MRSA infections.      Medications:   . aspirin EC  81 mg Oral Daily  . atorvastatin  80 mg Oral q1800  . clopidogrel  75 mg Oral Daily  . enoxaparin (LOVENOX) injection  80 mg Subcutaneous Q24H  . ezetimibe  10 mg Oral Daily  . furosemide  40 mg Oral Daily  . gabapentin  600 mg Oral TID  . isosorbide mononitrate  30 mg Oral Daily  . multivitamin  1 tablet Oral Daily  . pantoprazole  40 mg Oral Daily  . potassium chloride  20 mEq Oral BID  . potassium chloride  40 mEq Oral Once  . sodium chloride  3 mL Intravenous Q12H  . tamsulosin  0.4 mg Oral Daily   Continuous Infusions:   Time spent: 35 minutes.  The patient is medically complex with multiple co-morbidities and is at high risk for clinical deterioration and requires high complexity decision making, coordination of care with pulmonology.  Remains at the SDU level of care.   LOS: 2 days    Jevaughn Degollado  [facility] Pager [phone]. If unable to reach me by pager, please call my cell phone at [phone].  *Please refer to [email or web address], password [email or web address] to get updated schedule on who will round on this patient, as hospitalists switch teams weekly. If 7PM-7AM, please contact night-coverage at [email or web address], password [email or web address] for any overnight needs.  01/04/2015, 7:23 AM

## 2015-01-05 DIAGNOSIS — J9622 Acute and chronic respiratory failure with hypercapnia: Secondary | ICD-10-CM

## 2015-01-05 DIAGNOSIS — J9621 Acute and chronic respiratory failure with hypoxia: Secondary | ICD-10-CM

## 2015-01-05 MED ORDER — CETYLPYRIDINIUM CHLORIDE 0.05 % MT LIQD
7.0000 mL | Freq: Two times a day (BID) | OROMUCOSAL | Status: DC
  Administered 2015-01-05 – 2015-01-07 (×4): 7 mL via OROMUCOSAL

## 2015-01-05 NOTE — Progress Notes (Signed)
CSW consulted for SNF placement. PT / OT have recommended HHPT / HHOT at d/c. MD recommends SNF for CPAP compliance. [employer] medicare will not assist with cost of SNF placement at this time. CSW can assist with with pvt pay placement if pt / spouse are in agreement with this plan. MD notes from 01/04/15 indicate pt plans to return home at d/c. [facility] is assisting with d/c planning.  Werner Lean LCSW [phone]

## 2015-01-05 NOTE — Progress Notes (Signed)
Patient remains awake at this time, watching television. Denies being sleepy and tired. CPAP remains at bedside, not in use.

## 2015-01-05 NOTE — Progress Notes (Signed)
Pt stated that he wasn't sure if he wanted to use CPAP tonight.  RT to check back with pt at a later time, RT to monitor and assess as needed.

## 2015-01-05 NOTE — Progress Notes (Signed)
Patient remains awake at this time and denies being tired. No indication for CPAP during awake hours. VSS. RN aware. RT will continue to follow.

## 2015-01-05 NOTE — Progress Notes (Signed)
Progress Note   Benjamin Kaiser VVZ:[id number] DOB: 05/30/1934 DOA: 12/30/2014 PCP: Leonard Downing, MD   Brief Narrative:   Benjamin Kaiser is an 80 y.o. male with a PMH of hypertension, seizure disorder, hyperlipidemia, DVT, CVA, CAD and chronic diastolic CHF who was admitted 12/30/14 with a two-week history of altered mental status including increased somnolence in the setting of hypoxia with oxygen saturations in the mid 80s, thought to be from OHS/OSA.  Will need an outpatient sleep study and auto CPAP machine for home use at discharge.  Pulmonary team consulted, and recommendations given. Plan was to be compliant to CPAP on discharge.   Assessment/Plan:   Principal Problem:   Acute on chronic respiratory failure with hypoxia and hypercarbia / acute encephalopathy secondary to CO2 narcosis - VQ scan done 12/31/14: Low probability for PE. Lower extremity Dopplers negative for DVT. - 2-D echo done 12/31/14: EF 55-60 percent, grade 1 diastolic dysfunction noted. - Acute encephalopathy appears to be secondary to CO2 narcosis, confirmed by ABG done 01/01/15, resolved with BiPAP.  - Underlying etiology is likely obesity hypoventilation syndrome and obstructive sleep apnea.  - Will need sleep study and home C Pap. Pulmonology consultation done 01/02/15. -  Auto CPAP used. Still having difficulty tolerating.  Active Problems:   Functional quadriplegia (HCC) / Hereditary and idiopathic peripheral neuropathy (HCC) / Morbid obesity - Non-ambulatory for the past 3-4 months. - Evaluated by PT, home PT recommended.   - BMI 43.12. Diet education provided by dietitian 01/03/15. - Continue Neurontin.    Stage III chronic kidney disease - Baseline creatinine 1.3 with a GFR less than 60, consistent with chronic kidney disease. - Creatinine consistent with usual baseline values.    CAD (coronary artery disease)/Diastolic CHF, chronic (HCC)/aortic stenosis - No significant findings on  echocardiogram. Troponin negative. - Continue aspirin, Plavix and Imdur. - Monitoring on telemetry.    Hyperlipidemia - Continue statin and Zetia.    Hypertension - Continue Lasix, Imdur.    CVA (cerebral vascular accident) ([place]) - Continue risk factor modification. Continue aspirin/Plavix, statin.    Hypokalemia - Repleted.    Pressure ulcer - Wounds include: MASD moisture associated skin damage to the buttocks, gluteal skin folds. Skin tear upper left chest, axilla.  - Wound care per wound care RN RN recommendations. Bariatric air mattress.    DVT Prophylaxis - Lovenox ordered.   Family Communication/Anticipated D/C date and plan/Code Status   Family Communication: none at bedside. Talked to wife on the phone on 11/9 Disposition Plan: home with home PT when confusion improves.  Anticipated D/C date:  Possibly in a day.  Code Status:     Code Status Orders        Start     Ordered   12/30/14 2135  Full code   Continuous     12/30/14 2134    Advance Directive Documentation        Most Recent Value   Type of Advance Directive  Healthcare Power of Attorney, Living will   Pre-existing out of facility DNR order (yellow form or pink MOST form)     "MOST" Form in Place?         IV Access:    Peripheral IV   Procedures and diagnostic studies:   Nm Pulmonary Perf And Vent  12/31/2014  CLINICAL DATA:  Hypoxia EXAM: NUCLEAR MEDICINE VENTILATION - PERFUSION LUNG SCAN TECHNIQUE: Ventilation images were obtained in multiple projections using inhaled aerosol Tc-47m DTPA. Perfusion images  were obtained in multiple projections after intravenous injection of Tc-57m MAA. RADIOPHARMACEUTICALS:  32 millicuries [id number]-30Q DTPA aerosol inhalation and 4.3 millicuries of [id number]-56Y MAA IV COMPARISON:  Chest x-ray 12/30/2014 FINDINGS: Ventilation: Patchy perfusion defects within the lungs. Perfusion: Matching patchy perfusion defects throughout the lungs. No segmental or  subsegmental defects. IMPRESSION: Low probability study for pulmonary embolus. Electronically Signed   By: Rolm Baptise M.D.   On: 12/31/2014 12:59   Mr Attempted Daymon Larsen Report  12/31/2014  This examination belongs to an outside facility and is stored here for comparison purposes only.  Contact the originating outside institution for any associated report or interpretation.    Medical Consultants:    Dr. Chesley Mires, Pulmonology  Anti-Infectives:   Anti-infectives    None      Subjective:   Benjamin Kaiser is awake but still confused a little. Talked to wife, appears to be baseline for the last 4 to 6 months. .    Objective:    Filed Vitals:   01/05/15 0000 01/05/15 0400 01/05/15 0500 01/05/15 0800  BP: 148/76 131/62  161/83  Pulse: 65 65  66  Temp: 98.3 F (36.8 C) 98.6 F (37 C)  98.7 F (37.1 C)  TempSrc: Oral Oral  Oral  Resp: 17 19  16   Height:      Weight:   147.419 kg (325 lb)   SpO2: 92% 92%  93%    Intake/Output Summary (Last 24 hours) at 01/05/15 0928 Last data filed at 01/05/15 0400  Gross per 24 hour  Intake    240 ml  Output   2275 ml  Net  -2035 ml   Filed Weights   01/03/15 0620 01/04/15 0500 01/05/15 0500  Weight: 152.409 kg (336 lb) 154.677 kg (341 lb) 147.419 kg (325 lb)    Exam: Gen:  Somnolent, CPAP on Cardiovascular:  RRR, No M/R/G Respiratory:  Lungs diminished throughout Gastrointestinal:  Abdomen soft, NT/ND, + BS Extremities:  1+ edema   Data Reviewed:    Labs: Basic Metabolic Panel:  Recent Labs Lab 12/30/14 1547 12/31/14 0429 01/01/15 0535  NA 143 143 140  K 3.3* 3.2* 3.9  CL 102 101 100*  CO2 34* 34* 35*  GLUCOSE 116* 122* 109*  BUN 20 20 18   CREATININE 1.43* 1.34* 1.35*  CALCIUM 8.8* 8.6* 8.4*   GFR Estimated Creatinine Clearance: 69.1 mL/min (by C-G formula based on Cr of 1.35). Liver Function Tests:  Recent Labs Lab 12/30/14 1547 12/31/14 0429  AST 15 13*  ALT 12* 11*  ALKPHOS 58 52  BILITOT  0.7 0.7  PROT 6.7 5.9*  ALBUMIN 3.0* 2.7*   CBC:  Recent Labs Lab 12/30/14 1547 12/31/14 0429  WBC 12.1* 11.4*  NEUTROABS 9.1*  --   HGB 13.9 13.1  HCT 43.6 42.9  MCV 87.6 90.1  PLT 264 240   Cardiac Enzymes:  Recent Labs Lab 12/30/14 1547  TROPONINI <0.03   ABG    Component Value Date/Time   PHART 7.420 01/04/2015 0420   PCO2ART 49.4* 01/04/2015 0420   PO2ART 61.6* 01/04/2015 0420   HCO3 31.4* 01/04/2015 0420   TCO2 28.3 01/04/2015 0420   O2SAT 89.8 01/04/2015 0420    Microbiology Recent Results (from the past 240 hour(s))  MRSA PCR Screening     Status: None   Collection Time: 01/02/15 11:00 AM  Result Value Ref Range Status   MRSA by PCR NEGATIVE NEGATIVE Final    Comment:  The GeneXpert MRSA Assay (FDA approved for NASAL specimens only), is one component of a comprehensive MRSA colonization surveillance program. It is not intended to diagnose MRSA infection nor to guide or monitor treatment for MRSA infections.      Medications:   . aspirin EC  81 mg Oral Daily  . atorvastatin  80 mg Oral q1800  . clopidogrel  75 mg Oral Daily  . enoxaparin (LOVENOX) injection  80 mg Subcutaneous Q24H  . ezetimibe  10 mg Oral Daily  . furosemide  40 mg Oral Daily  . gabapentin  600 mg Oral TID  . isosorbide mononitrate  30 mg Oral Daily  . multivitamin  1 tablet Oral Daily  . pantoprazole  40 mg Oral Daily  . potassium chloride  20 mEq Oral BID  . potassium chloride  40 mEq Oral Once  . sodium chloride  3 mL Intravenous Q12H  . tamsulosin  0.4 mg Oral Daily   Continuous Infusions:   Time spent: 25 minutes.  LOS: 3 days   [facility]  [facility] Pager [phone]  *Please refer to [email or web address], password [email or web address] to get updated schedule on who will round on this patient, as hospitalists switch teams weekly. If 7PM-7AM, please contact night-coverage at [email or web address], password [email or web address] for any overnight needs.  01/05/2015, 9:28 AM

## 2015-01-05 NOTE — Progress Notes (Signed)
Hand off report called to [employer].  The patient is transferring to room 1406 via bed.

## 2015-01-06 LAB — CBC WITH DIFFERENTIAL/PLATELET
BASOS PCT: 0 %
Basophils Absolute: 0 10*3/uL (ref 0.0–0.1)
EOS PCT: 4 %
Eosinophils Absolute: 0.5 10*3/uL (ref 0.0–0.7)
HEMATOCRIT: 39.4 % (ref 39.0–52.0)
Hemoglobin: 12.7 g/dL — ABNORMAL LOW (ref 13.0–17.0)
LYMPHS PCT: 14 %
Lymphs Abs: 1.7 10*3/uL (ref 0.7–4.0)
MCH: 27.9 pg (ref 26.0–34.0)
MCHC: 32.2 g/dL (ref 30.0–36.0)
MCV: 86.6 fL (ref 78.0–100.0)
MONO ABS: 1.1 10*3/uL — AB (ref 0.1–1.0)
Monocytes Relative: 9 %
NEUTROS PCT: 73 %
Neutro Abs: 9.1 10*3/uL — ABNORMAL HIGH (ref 1.7–7.7)
Platelets: 226 10*3/uL (ref 150–400)
RBC: 4.55 MIL/uL (ref 4.22–5.81)
RDW: 15.5 % (ref 11.5–15.5)
WBC: 12.4 10*3/uL — AB (ref 4.0–10.5)

## 2015-01-06 LAB — BASIC METABOLIC PANEL
ANION GAP: 6 (ref 5–15)
BUN: 16 mg/dL (ref 6–20)
CALCIUM: 8.8 mg/dL — AB (ref 8.9–10.3)
CO2: 34 mmol/L — AB (ref 22–32)
Chloride: 98 mmol/L — ABNORMAL LOW (ref 101–111)
Creatinine, Ser: 1.11 mg/dL (ref 0.61–1.24)
GFR calc Af Amer: >60 mL/min (ref >60)
GFR calc non Af Amer: >60 mL/min (ref >60)
GLUCOSE: 114 mg/dL — AB (ref 65–99)
Potassium: 4 mmol/L (ref 3.5–5.1)
Sodium: 138 mmol/L (ref 135–145)

## 2015-01-06 MED ORDER — ENOXAPARIN SODIUM 80 MG/0.8ML ~~LOC~~ SOLN
70.0000 mg | SUBCUTANEOUS | Status: DC
  Administered 2015-01-06: 70 mg via SUBCUTANEOUS
  Filled 2015-01-06: qty 0.8

## 2015-01-06 NOTE — Progress Notes (Signed)
  Date: November10, 2016 Chart reviewed for concurrent status and case management needs. Will continue to follow patient for changes and needs: Cpap adjustments Velva Harman, RN, BSN, [place]   [phone]

## 2015-01-06 NOTE — Progress Notes (Signed)
Physical Therapy Treatment Patient Details Name: Benjamin Kaiser MRN: [id number] DOB: Apr 11, 1934 Today's Date: 01/06/2015    History of Present Illness 80 yo male admitted with respiratory failure. Hx of HTN, Sz, gout, DVt, CVA, CAD, HF, obesity.     PT Comments    Able to get pt to perform a few exercises this session. Much more lethargic today. Sleeping most of day per wife. Discussed d/c plan again-pt will return home with wife and HHPT.   Follow Up Recommendations  Home health PT;Supervision/Assistance - 24 hour; Home health Aide     Equipment Recommendations  None recommended by PT    Recommendations for Other Services       Precautions / Restrictions Precautions Precautions: Fall Restrictions Weight Bearing Restrictions: No    Mobility  Bed Mobility                  Transfers                    Ambulation/Gait                 Stairs            Wheelchair Mobility    Modified Rankin (Stroke Patients Only)       Balance                                    Cognition Arousal/Alertness: Lethargic Behavior During Therapy: WFL for tasks assessed/performed                        Exercises General Exercises - Upper Extremity Shoulder Flexion: AROM;Right;Left;Supine;10 reps Elbow Flexion: AROM;Right;Left;Supine;10 reps General Exercises - Lower Extremity Heel Slides: AAROM;Both;10 reps;Supine    General Comments        Pertinent Vitals/Pain Pain Assessment: Faces Faces Pain Scale: Hurts even more Pain Location: R knee/LE Pain Descriptors / Indicators: Sore Pain Intervention(s): Monitored during session    Home Living                      Prior Function            PT Goals (current goals can now be found in the care plan section) Progress towards PT goals: Not progressing toward goals - comment (more lethargic on today)    Frequency  Min 2X/week    PT Plan Current plan remains  appropriate    Co-evaluation             End of Session   Activity Tolerance: Patient limited by fatigue;Patient limited by lethargy Patient left: in bed;with call bell/phone within reach;with family/visitor present     Time: 1410-1419 PT Time Calculation (min) (ACUTE ONLY): 9 min  Charges:  $Gait Training: 8-22 mins                    G Codes:      Weston Anna, MPT Pager: [phone]

## 2015-01-06 NOTE — Progress Notes (Signed)
PT Cancellation Note  Patient Details Name: Benjamin Kaiser MRN: [id number] DOB: 11/06/1934   Cancelled Treatment:    Reason Eval/Treat Not Completed: Attempted Pt tx session. Pt would not awaken/open eyes to verbal or tactile stimuli. Will check back another time.    Weston Anna, MPT Pager: [phone]

## 2015-01-06 NOTE — Care Management Note (Signed)
Case Management Note  Patient Details  Name: Benjamin Kaiser MRN: [id number] Date of Birth: 06/07/1934  Subjective/Objective:                    Action/Plan:   Expected Discharge Date:   (unknown)               Expected Discharge Plan:  Home/Self Care  In-House Referral:     Discharge planning Services  CM Consult  Post Acute Care Choice:    Choice offered to:     DME Arranged:    DME Agency:     HH Arranged:    HH Agency:     Status of Service:  In process, will continue to follow  Medicare Important Message Given:    Date Medicare IM Given:    Medicare IM give by:    Date Additional Medicare IM Given:    Additional Medicare Important Message give by:     If discussed at [place] of Stay Meetings, dates discussed:  [id number]  Additional Comments:  Leeroy Cha, RN 01/06/2015, 10:20 AM

## 2015-01-06 NOTE — Progress Notes (Signed)
Pt placed on CPAP at 10 CMH20 via FFM with 2 LPM O2.  Pt tolerating well at this time, RT to monitor and assess as needed.

## 2015-01-06 NOTE — Progress Notes (Signed)
Progress Note   Benjamin Kaiser [id number] DOB: 1934-03-03 DOA: 12/30/2014 PCP: Leonard Downing, MD   Brief Narrative:   Benjamin Kaiser is an 80 y.o. male with a PMH of hypertension, seizure disorder, hyperlipidemia, DVT, CVA, CAD and chronic diastolic CHF who was admitted 12/30/14 with a two-week history of altered mental status including increased somnolence in the setting of hypoxia with oxygen saturations in the mid 80s, thought to be from OHS/OSA.  Will need an outpatient sleep study and auto CPAP machine for home use at discharge.  Pulmonary team consulted, and recommendations given. Plan was to be compliant to CPAP on discharge. Today patient appears very lethargic and sleeping a lot when compared to yesterday.   Assessment/Plan:   Principal Problem:   Acute on chronic respiratory failure with hypoxia and hypercarbia / acute encephalopathy secondary to CO2 narcosis - VQ scan done 12/31/14: Low probability for PE. Lower extremity Dopplers negative for DVT. - 2-D echo done 12/31/14: EF 55-60 percent, grade 1 diastolic dysfunction noted. - Acute encephalopathy appears to be secondary to CO2 narcosis, confirmed by ABG done 01/01/15, resolved with BiPAP.  - Underlying etiology is likely obesity hypoventilation syndrome and obstructive sleep apnea.  - Will need sleep study and home C Pap. Pulmonology consultation done 01/02/15. -  Auto CPAP used, last night the cpap was tolerated for the first time.  - case manager requested to set up the outpatient appointment for sleep study and CPAP to be delivered tot he house before discharge.   Active Problems:   Functional quadriplegia (HCC) / Hereditary and idiopathic peripheral neuropathy (HCC) / Morbid obesity - Non-ambulatory for the past 3-4 months. - Evaluated by PT, home PT recommended.   - BMI 43.12. Diet education provided by dietitian 01/03/15. - Continue Neurontin.    Stage III chronic kidney disease - Baseline creatinine 1.3  with a GFR less than 60, consistent with chronic kidney disease. - Creatinine consistent with usual baseline values. - creatinine much improved when compared to baseline.     CAD (coronary artery disease)/Diastolic CHF, chronic (HCC)/aortic stenosis - No significant findings on echocardiogram. Troponin negative. - Continue aspirin, Plavix and Imdur. - Monitoring on telemetry.    Hyperlipidemia - Continue statin and Zetia.    Hypertension - Continue Lasix, Imdur.    CVA (cerebral vascular accident) ([place]) - Continue risk factor modification. Continue aspirin/Plavix, statin.    Hypokalemia - Repleted.    Pressure ulcer - Wounds include: MASD moisture associated skin damage to the buttocks, gluteal skin folds. Skin tear upper left chest, axilla.  - Wound care per wound care RN RN recommendations. Bariatric air mattress.    DVT Prophylaxis - Lovenox ordered.  Lethargy today: plan to repeat ABG if he continues to sleep through the day.   Family Communication/Anticipated D/C date and plan/Code Status   Family Communication: none at bedside. Talked to wife on the phone at bedside.  Disposition Plan: home with home PT in am  When he is more alert.  Anticipated D/C date:  In am.   Code Status:     Code Status Orders        Start     Ordered   12/30/14 2135  Full code   Continuous     12/30/14 2134    Advance Directive Documentation        Most Recent Value   Type of Advance Directive  Healthcare Power of [place], Living will   Pre-existing out of facility DNR  order (yellow form or pink MOST form)     "MOST" Form in Place?         IV Access:    Peripheral IV   Procedures and diagnostic studies:   Nm Pulmonary Perf And Vent  12/31/2014  CLINICAL DATA:  Hypoxia EXAM: NUCLEAR MEDICINE VENTILATION - PERFUSION LUNG SCAN TECHNIQUE: Ventilation images were obtained in multiple projections using inhaled aerosol Tc-79m DTPA. Perfusion images were obtained in multiple  projections after intravenous injection of Tc-46m MAA. RADIOPHARMACEUTICALS:  32 millicuries [id number] DTPA aerosol inhalation and 4.3 millicuries of [id number] MAA IV COMPARISON:  Chest x-ray 12/30/2014 FINDINGS: Ventilation: Patchy perfusion defects within the lungs. Perfusion: Matching patchy perfusion defects throughout the lungs. No segmental or subsegmental defects. IMPRESSION: Low probability study for pulmonary embolus. Electronically Signed   By: Rolm Baptise M.D.   On: 12/31/2014 12:59   Mr Attempted Daymon Larsen Report  12/31/2014  This examination belongs to an outside facility and is stored here for comparison purposes only.  Contact the originating outside institution for any associated report or interpretation.    Medical Consultants:    Dr. Chesley Mires, Pulmonology  Anti-Infectives:   Anti-infectives    None      Subjective:   Benjamin Kaiser is awake but still confused a little. Talked to wife, appears to be baseline for the last 4 to 6 months. .    Objective:    Filed Vitals:   01/05/15 1600 01/05/15 1747 01/05/15 2150 01/06/15 0450  BP: 111/71 134/74 120/81 172/95  Pulse: 77 74 74 62  Temp: 98.1 F (36.7 C) 97.7 F (36.5 C) 99.6 F (37.6 C) 97.6 F (36.4 C)  TempSrc: Oral Oral Axillary Axillary  Resp: 17 14 16 16   Height:      Weight:    144.697 kg (319 lb)  SpO2: 93% 93% 97% 97%    Intake/Output Summary (Last 24 hours) at 01/06/15 1645 Last data filed at 01/06/15 0458  Gross per 24 hour  Intake      0 ml  Output    500 ml  Net   -500 ml   Filed Weights   01/04/15 0500 01/05/15 0500 01/06/15 0450  Weight: 154.677 kg (341 lb) 147.419 kg (325 lb) 144.697 kg (319 lb)    Exam: Gen:  Somnolent, CPAP on Cardiovascular:  RRR, No M/R/G Respiratory:  Lungs diminished throughout Gastrointestinal:  Abdomen soft, NT/ND, + BS Extremities:  1+ edema   Data Reviewed:    Labs: Basic Metabolic Panel:  Recent Labs Lab 12/31/14 0429  01/01/15 0535 01/06/15 0441  NA 143 140 138  K 3.2* 3.9 4.0  CL 101 100* 98*  CO2 34* 35* 34*  GLUCOSE 122* 109* 114*  BUN 20 18 16   CREATININE 1.34* 1.35* 1.11  CALCIUM 8.6* 8.4* 8.8*   GFR Estimated Creatinine Clearance: 83.2 mL/min (by C-G formula based on Cr of 1.11). Liver Function Tests:  Recent Labs Lab 12/31/14 0429  AST 13*  ALT 11*  ALKPHOS 52  BILITOT 0.7  PROT 5.9*  ALBUMIN 2.7*   CBC:  Recent Labs Lab 12/31/14 0429 01/06/15 0441  WBC 11.4* 12.4*  NEUTROABS  --  9.1*  HGB 13.1 12.7*  HCT 42.9 39.4  MCV 90.1 86.6  PLT 240 226   Cardiac Enzymes: No results for input(s): CKTOTAL, CKMB, CKMBINDEX, TROPONINI in the last 168 hours. ABG    Component Value Date/Time   PHART 7.420 01/04/2015 0420   PCO2ART 49.4* 01/04/2015 0420   PO2ART  61.6* 01/04/2015 0420   HCO3 31.4* 01/04/2015 0420   TCO2 28.3 01/04/2015 0420   O2SAT 89.8 01/04/2015 0420    Microbiology Recent Results (from the past 240 hour(s))  MRSA PCR Screening     Status: None   Collection Time: 01/02/15 11:00 AM  Result Value Ref Range Status   MRSA by PCR NEGATIVE NEGATIVE Final    Comment:        The GeneXpert MRSA Assay (FDA approved for NASAL specimens only), is one component of a comprehensive MRSA colonization surveillance program. It is not intended to diagnose MRSA infection nor to guide or monitor treatment for MRSA infections.      Medications:   . antiseptic oral rinse  7 mL Mouth Rinse BID  . aspirin EC  81 mg Oral Daily  . atorvastatin  80 mg Oral q1800  . clopidogrel  75 mg Oral Daily  . enoxaparin (LOVENOX) injection  70 mg Subcutaneous Q24H  . ezetimibe  10 mg Oral Daily  . furosemide  40 mg Oral Daily  . gabapentin  600 mg Oral TID  . isosorbide mononitrate  30 mg Oral Daily  . multivitamin  1 tablet Oral Daily  . pantoprazole  40 mg Oral Daily  . potassium chloride  20 mEq Oral BID  . potassium chloride  40 mEq Oral Once  . sodium chloride  3 mL  Intravenous Q12H  . tamsulosin  0.4 mg Oral Daily   Continuous Infusions:   Time spent: 25 minutes.  LOS: 4 days   [facility]  [facility] Pager [phone]  *Please refer to [email or web address], password [email or web address] to get updated schedule on who will round on this patient, as hospitalists switch teams weekly. If 7PM-7AM, please contact night-coverage at [email or web address], password [email or web address] for any overnight needs.  01/06/2015, 4:45 PM

## 2015-01-06 NOTE — Progress Notes (Signed)
Placed pt on cpap for rest on previous night's settings with 4l o2 bleedin: autotitration mode, minimum epap=10cm h2o, max=20cm h2o.  Pt is tolerating it well at this time.  RN aware.  HR73, spo2 94%.

## 2015-01-06 NOTE — Care Management Note (Signed)
Case Management Note  Patient Details  Name: Benjamin Kaiser MRN: [id number] Date of Birth: Oct 24, 1934  Subjective/Objective:  MD ordered for home CPAP machine.[facility] dme rep Pura Spice is following for all requirements for home CPAP. I have faxed to Sleep disorders w/confirmation-otpt sleep study order form-await return call for date.[facility] rep Tiffany already following for resumption of HHRN/PT/Nurse's aide orders, & f38f.                  Action/Plan:d/c plan home w/HHC/CPAP machine-to be delivered to home.   Expected Discharge Date:   (unknown)               Expected Discharge Plan:  [place]  In-House Referral:     Discharge planning Services  CM Consult  Post Acute Care Choice:  Home Health (Active w/[facility] HHRN/PT/aide) Choice offered to:     DME Arranged:  Continuous positive airway pressure (CPAP) DME Agency:     HH Arranged:    HH Agency:     Status of Service:  In process, will continue to follow  Medicare Important Message Given:  Yes Date Medicare IM Given:    Medicare IM give by:    Date Additional Medicare IM Given:    Additional Medicare Important Message give by:     If discussed at [place] of Stay Meetings, dates discussed:    Additional Comments:  Dessa Phi, RN 01/06/2015, 1:10 PM

## 2015-01-06 NOTE — Care Management Note (Signed)
Case Management Note  Patient Details  Name: TIRRELL LUELLEN MRN: [id number] Date of Birth: February 23, 1935  Subjective/Objective: [facility] following for [facility] dme rep following for home cpap, & home 02.                   Action/Plan:d/c home w/HHC/DME   Expected Discharge Date:   (unknown)               Expected Discharge Plan:  [place]  In-House Referral:     Discharge planning Services  CM Consult  Post Acute Care Choice:  Home Health (Active w/[facility] HHRN/PT/aide) Choice offered to:     DME Arranged:  Continuous positive airway pressure (CPAP) DME Agency:  [place] Arranged:  RN, PT, OT, Nurse's Aide, Social Work [employer] Agency:  [place]  Status of Service:  In process, will continue to follow  Medicare Important Message Given:  Yes Date Medicare IM Given:    Medicare IM give by:    Date Additional Medicare IM Given:    Additional Medicare Important Message give by:     If discussed at [place] of Stay Meetings, dates discussed:    Additional Comments:  Dessa Phi, RN 01/06/2015, 1:56 PM

## 2015-01-06 NOTE — Progress Notes (Signed)
OT Cancellation Note  Patient Details Name: Benjamin Kaiser MRN: [id number] DOB: 1934/07/21   Cancelled Treatment:    Reason Eval/Treat Not Completed: Fatigue/lethargy limiting ability to participate   Pt woke briefly and stated ' i am sleepy ' and closed eyes back.  Wife present.  Will reattempt later in day or next day  [place], Thereasa Parkin 01/06/2015, 12:30 PM

## 2015-01-06 NOTE — Care Management Important Message (Signed)
Important Message  Patient Details  Name: Benjamin Kaiser MRN: [id number] Date of Birth: August 03, 1934   Medicare Important Message Given:  Yes    Camillo Flaming 01/06/2015, 12:08 [place] Message  Patient Details  Name: Benjamin Kaiser MRN: [id number] Date of Birth: [birth year]   Medicare Important Message Given:  Yes    Camillo Flaming 01/06/2015, 12:08 PM

## 2015-01-07 ENCOUNTER — Telehealth: Payer: Self-pay

## 2015-01-07 DIAGNOSIS — I5032 Chronic diastolic (congestive) heart failure: Secondary | ICD-10-CM

## 2015-01-07 MED ORDER — METOPROLOL SUCCINATE ER 25 MG PO TB24: 25.0000 mg | ORAL_TABLET | Freq: Every day | ORAL | Status: DC

## 2015-01-07 MED ORDER — IPRATROPIUM-ALBUTEROL 0.5-2.5 (3) MG/3ML IN SOLN: 3.0000 mL | Freq: Four times a day (QID) | RESPIRATORY_TRACT | Status: AC | PRN

## 2015-01-07 NOTE — Progress Notes (Signed)
SATURATION QUALIFICATIONS: (This note is used to comply with regulatory documentation for home oxygen)  Patient Saturations on Room Air at Rest = 87 %  Patient Saturations on Room Air while Ambulating = 87 %, pt is immobile  Patient Saturations on 2 liters = 94 %  Please briefly explain why patient needs home oxygen: Patient unable to go without oxygen d/t decreased O2 saturation. Bipap removed, O2 sat assessed on Ra at 87%, 2 Lt. applied O2 sat increased 94%

## 2015-01-07 NOTE — Telephone Encounter (Signed)
Dr Halford Chessman spoke to a Dr Rama (Dr Byrd Hesselbach) and not me Dr Brantley Persons.  I never saw this patient and no documentaiton of discussion with me.  THis patient can see NP Tammy or Dr Halford Chessman within a week  Thanks  Dr. Brand Males, M.D., [facility].C.P Pulmonary and Critical Care Medicine Staff Physician [place] [facility] Pager: [phone], If no answer or between  15:00h - 7:00h: call [phone]  01/07/2015 2:25 PM

## 2015-01-07 NOTE — Telephone Encounter (Addendum)
Attempted to contact patient, line busy, will call back Patient still admitted as of today at 1:40pm  Patient was seen in the hospital by Dr. Halford Chessman.. Per Dr. Juanetta Gosling notes, he discussed patient with Dr. Chase Caller  Dr. Halford Chessman, Dr. Chase Caller:  Do you have anywhere I can add this patient on the schedule in 1 week?

## 2015-01-07 NOTE — Progress Notes (Signed)
DC instruction reviewed with patients spouse. Questions concerns were denied. DC provider f/u reviewed and scheduled while in room. PCP 11/17 at 12:30.Unable to complete pulmonary appt d/t no availability, office Rn to f/u for scheduling. Patient is at baseline, [facility] will be used for transport.

## 2015-01-07 NOTE — Progress Notes (Signed)
Occupational Therapy Treatment Patient Details Name: BARACK KLEFFNER MRN: [id number] DOB: 02/06/1935 Today's Date: 01/07/2015    History of present illness 80 yo male admitted with respiratory failure. Hx of HTN, Sz, gout, DVt, CVA, CAD, HF, obesity.    OT comments  Patient making slow progress. Pt is limited by decreased endurance and strength. Pt on 2 L/min supplemental 02. Pt oriented X4 during this treatment session.   Follow Up Recommendations  Home health OT;Supervision/Assistance - 24 hour    Equipment Recommendations  None recommended by OT    Recommendations for Other Services  None at this time   Precautions / Restrictions Precautions Precautions: Fall Restrictions Weight Bearing Restrictions: No    Mobility Bed Mobility Overal bed mobility: Needs Assistance Bed Mobility: Rolling Rolling: Max assist;Total assist  Transfers Did not occur       ADL Overall ADL's : Needs assistance/impaired Eating/Feeding: Total assistance;Bed level Eating/Feeding Details (indicate cue type and reason): per wife report  Grooming: Wash/dry hands;Wash/dry face;Oral care;Applying deodorant;Brushing hair;Moderate assistance;Bed level   Upper Body Bathing: Maximal assistance;Bed level   Lower Body Bathing: Total assistance;Bed level   Upper Body Dressing : Total assistance;Bed level   Lower Body Dressing: Total assistance;Bed level   Toilet Transfer: Total assistance Toilet Transfer Details (indicate cue type and reason): unable, uses bed pan or chuck Toileting- Clothing Manipulation and Hygiene: Total assistance;Bed level       Functional mobility during ADLs: Total assistance General ADL Comments: Pt fatigues quickly. Sats =93% on 2 L/min supplemental 02 after minimal activity/exercise in bed. Wife with no questions or concerns for home. Wife reports patient has been bed bound for awhile. Wife states she uses a chuck for BM needs and urinal for urine output needs,  administerred bariatric bed pan and discussed importance of skin checks and risk of skin breakdown. Encouraged wife to have patient roll in bed every 2 hours to decrease likliness of skin breakdown. Engaged pt in some UE ROM exercises; shoulder flexion/extension, elbow flexion/extension, wrist flexion/extension. Encouraged wife to perform these with patient at home.           Cognition   Behavior During Therapy: WFL for tasks assessed/performed Overall Cognitive Status: History of cognitive impairments - at baseline (wife reports that patient's cognition has been declining. Pt oriented X4 during this treatment session)                 Pertinent Vitals/ Pain       Pain Assessment: No/denies pain   Frequency Min 2X/week     Progress Toward Goals  OT Goals(current goals can now befound in the care plan section)  Progress towards OT goals: Progressing toward goals     Plan Discharge plan remains appropriate    End of Session     Activity Tolerance Patient limited by fatigue   Patient Left in bed;with call bell/phone within reach;with family/visitor present    Time: [id number] OT Time Calculation (min): 27 min  Charges: OT General Charges $OT Visit: 1 Procedure OT Treatments $Self Care/Home Management : 8-22 mins $Therapeutic Exercise: 8-22 mins  Merline Perkin,, MS, OTR/L, CLT Pager: [phone]  01/07/2015, 1:46 PM

## 2015-01-07 NOTE — Plan of Care (Signed)
Problem: Food- and Nutrition-Related Knowledge Deficit (NB-1.1) Goal: Nutrition education Formal process to instruct or train a patient/client in a skill or to impart knowledge to help patients/clients voluntarily manage or modify food choices and eating behavior to maintain or improve health.  NUTRITION EDUCATION NOTE  Patient was alert with improved mentation during follow-up visit for heart healthy diet education. However, the patient was fatigued and very hard of hearing. Wife was in room during visit and participated in discussion. Patient states that he has a good appetite and his meal completion ranges from 75-100%. Wife states he is a good eater and that he always has been. Patient's BMI is 42.8, morbid obesity.   Per wife, he has received diet education in the past and there was some observed inital hesitation about talking about diet. She was told in the past that she had to give up fried foods, and sweet foods - which she was not happy about. She is the primary cook at home, and describes usual meals as typical [place] fare - with lots of fried foods. Due to limited mobility and the patient waking up late, the family has transitioned to a 2x/day meal pattern. Breakfast is usually late in the morning - eggs, sausage, grits, coffee, OJ and ocasionaly cereal. Lunch may be a snack - ice cream, or chips. Dinner is the main meal - often a meat (steak) and vegetables, with mixed fruit and dessert.   Patient has lost 32 lbs over the past 9 months (9% / weight change; not significant).  Wife attributes weight change to the new twice a day meal pattern.   Discussed with patient and wife the current diet order, low sodium / heart healthy diet. The patient states that he thinks the "food tastes good". Provided diet education on low sodium nutrition therapy - including: reading food labels for canned fruits / vegetables, reducing processed foods, and using salt alternatives. Patient's wife was familiar  with Mrs. Dash. She states that her husband used to add on a lot of salt to foods at the table. She is the one bringing the patient most of his meals, and she has stopped bringing salt with the food. Encouraged the wife to continue with this positive changes and talked about some other small ways to reduce sodium - such as choosing low sodium canned fruits and vegetables. Wife appears interested in handout materials and wants to help patient reduce his fluid retention which has been increasing. Provided family with handouts from Academy of [employer] and [employer]etic's Nutrition Care Manual, "Low Sodium Nutrition Therapy," and "Low Sodium Shopping Guide."   Teach back method used. Expected compliance - moderate.   Labs reviewed: low Ca 8.8, low Cl 98, high WBC 12.4, low HDL 33.10, TG WNL  Medications reviewed.

## 2015-01-07 NOTE — Care Management Note (Signed)
Case Management Note  Patient Details  Name: Benjamin Kaiser MRN: [id number] Date of Birth: April 27, 1934  Subjective/Objective:[facility] rep Cyril Mourning aware of [place] orders, & d/c. [facility] dme rep Colletta [place] aware of Home CPAP, Home 02 orders, & 02 sats qualified for home 02. Confirmed Sleep study appt on 02/08/15 @8p .                  Action/Plan:d/c home w/HHC/DME.   Expected Discharge Date:   (unknown)               Expected Discharge Plan:  [place]  In-House Referral:     Discharge planning Services  CM Consult  Post Acute Care Choice:  Home Health (Active w/[facility] HHRN/PT/aide) Choice offered to:     DME Arranged:  Continuous positive airway pressure (CPAP), Oxygen DME Agency:  [place] Arranged:  RN, PT, OT, Nurse's Aide, Social Work [employer] Agency:  [place]  Status of Service:  Completed, signed off  Medicare Important Message Given:  Yes Date Medicare IM Given:    Medicare IM give by:    Date Additional Medicare IM Given:    Additional Medicare Important Message give by:     If discussed at [place] of Stay Meetings, dates discussed:    Additional Comments:  Dessa Phi, RN 01/07/2015, 1:02 PM

## 2015-01-08 NOTE — Telephone Encounter (Signed)
This patient was seen by Dr. Elsworth Soho, not Dr. Chase Caller.

## 2015-01-10 NOTE — Telephone Encounter (Signed)
lmtcb x1 for pt. 

## 2015-01-10 NOTE — Discharge Summary (Signed)
Physician Discharge Summary  Benjamin Kaiser [id number] DOB: 1935/01/23 DOA: 12/30/2014  PCP: Benjamin Downing, MD  Admit date: 12/30/2014 Discharge date: 01/07/2015  Time spent: 25  minutes  Recommendations for Outpatient Follow-up:  1. Follow up with home health PT as recommended.  2. Please follow up with pulmonology as recommended.  3. Please follow up recommendations of wound care.    Discharge Diagnoses:  Principal Problem:   Respiratory failure, acute and chronic ([place]), with hypoxia and hypercarbia Active Problems:   CAD (coronary artery disease)   Diastolic CHF, chronic (HCC)   Hyperlipidemia   Hypertension   Morbid obesity ([place])   CVA (cerebral vascular accident) ([place])   Hereditary and idiopathic peripheral neuropathy   Aortic stenosis   Hypoxia   Pressure ulcer   Stage III chronic kidney disease   Acute encephalopathy   CO2 narcosis   Functional quadriplegia (HCC)   Acute on chronic respiratory failure ([place])   Morbid obesity with BMI of 40.0-44.9, adult ([place])   Discharge Condition: improved  Diet recommendation: low sodium diet.   Filed Weights   01/05/15 0500 01/06/15 0450 01/07/15 0611  Weight: 147.419 kg (325 lb) 144.697 kg (319 lb) 146.965 kg (324 lb)    History of present illness:  Benjamin Kaiser is an 80 y.o. male with a PMH of hypertension, seizure disorder, hyperlipidemia, DVT, CVA, CAD and chronic diastolic CHF who was admitted 12/30/14 with a two-week history of altered mental status including increased somnolence in the setting of hypoxia with oxygen saturations in the mid 80s, thought to be from OHS/OSA. Will need an outpatient sleep study and auto CPAP machine for home use at discharge. Pulmonary team consulted, and recommendations given. Plan was to be compliant to CPAP on discharge.   Hospital Course:  Acute on chronic respiratory failure with hypoxia and hypercarbia / acute encephalopathy secondary to CO2 narcosis - VQ scan done 12/31/14:  Low probability for PE. Lower extremity Dopplers negative for DVT. - 2-D echo done 12/31/14: EF 55-60 percent, grade 1 diastolic dysfunction noted. - Acute encephalopathy appears to be secondary to CO2 narcosis, confirmed by ABG done 01/01/15, resolved with BiPAP.  - Underlying etiology is likely obesity hypoventilation syndrome and obstructive sleep apnea.  - Will need sleep study and home C Pap. Pulmonology consultation done 01/02/15. - Auto CPAP used,  the cpap was tolerated for the first time.  - case manager requested to set up the outpatient appointment for sleep study and CPAP to be delivered tot he house before discharge.   Active Problems:  Functional quadriplegia (HCC) / Hereditary and idiopathic peripheral neuropathy (HCC) / Morbid obesity - Non-ambulatory for the past 3-4 months. - Evaluated by PT, home PT recommended.  - BMI 43.12. Diet education provided by dietitian 01/03/15. - Continue Neurontin.   Stage III chronic kidney disease - Baseline creatinine 1.3 with a GFR less than 60, consistent with chronic kidney disease. - Creatinine consistent with usual baseline values. - creatinine much improved when compared to baseline on discharge.    CAD (coronary artery disease)/Diastolic CHF, chronic (HCC)/aortic stenosis - No significant findings on echocardiogram. Troponin negative. - Continue aspirin, Plavix and Imdur.    Hyperlipidemia - Continue statin and Zetia.   Hypertension - Continue Lasix, Imdur.   CVA (cerebral vascular accident) ([place]) - Continue risk factor modification. Continue aspirin/Plavix, statin.   Hypokalemia - Repleted.   Pressure ulcer - Wounds include: MASD moisture associated skin damage to the buttocks, gluteal skin folds. Skin tear upper left  chest, axilla.  - Wound care per wound care RN recommendations. Bariatric air mattress.   Procedures:  None   Consultations:  Wound care   Discharge Exam: Filed Vitals:   01/07/15  1430  BP: 132/85  Pulse: 63  Temp: 98 F (36.7 C)  Resp: 16    General: alert afebrile comfortable.  Cardiovascular: s1s2 Respiratory: ctab  Discharge Instructions   Discharge Instructions    Diet - low sodium heart healthy    Complete by:  As directed      Discharge instructions    Complete by:  As directed   Please get the sleep study done.  Please follow upw ith pulmonology as recommended.  Please follow up with PCP post hospitalization visit in 1 to 2 weeks.          Discharge Medication List as of 01/07/2015  1:19 PM    START taking these medications   Details  ipratropium-albuterol (DUONEB) 0.5-2.5 (3) MG/3ML SOLN Take 3 mLs by nebulization every 6 (six) hours as needed., Starting 01/07/2015, Until Discontinued, Print      CONTINUE these medications which have CHANGED   Details  metoprolol succinate (TOPROL XL) 25 MG 24 hr tablet Take 1 tablet (25 mg total) by mouth daily., Starting 01/07/2015, Until Discontinued, Print      CONTINUE these medications which have NOT CHANGED   Details  aspirin EC 81 MG tablet Take 81 mg by mouth daily., Until Discontinued, Historical Med    atorvastatin (LIPITOR) 80 MG tablet Take 1 tablet by mouth  daily, Normal    clopidogrel (PLAVIX) 75 MG tablet Take 1 tablet by mouth  daily, Normal    docusate sodium (COLACE) 100 MG capsule Take 100 mg by mouth 2 (two) times daily as needed for mild constipation., Until Discontinued, Historical Med    furosemide (LASIX) 40 MG tablet Take 1 tablet by mouth  daily, Normal    gabapentin (NEURONTIN) 300 MG capsule Take 2 capsules (600 mg total) by mouth 3 (three) times daily., Starting 08/16/2014, Until Discontinued, Normal    isosorbide mononitrate (IMDUR) 30 MG 24 hr tablet Take 1 tablet by mouth  daily, Normal    Multiple Vitamins-Minerals (EYE VITAMINS) CAPS Take 1 capsule by mouth daily.  , Until Discontinued, Historical Med    neomycin-bacitracin-polymyxin (NEOSPORIN) ointment Apply  1 application topically as needed for wound care., Until Discontinued, Historical Med    nitroGLYCERIN (NITROSTAT) 0.4 MG SL tablet Place 1 tablet (0.4 mg total) under the tongue every 5 (five) minutes as needed., Starting 10/02/2013, Until Discontinued, Print    pantoprazole (PROTONIX) 40 MG tablet Take 1 tablet (40 mg total) by mouth daily., Starting 12/24/2014, Until Discontinued, Normal    Propylene Glycol (SYSTANE BALANCE) 0.6 % SOLN Place 1-2 drops into both eyes at bedtime as needed (for dry eyes)., Until Discontinued, Historical Med    Tamsulosin HCl (FLOMAX) 0.4 MG CAPS Take 0.4 mg by mouth daily., Until Discontinued, Historical Med    Vitamins A & D (VITAMIN A & D) ointment Apply 1 application topically as needed for dry skin., Until Discontinued, Historical Med    ZETIA 10 MG tablet Take 1 tablet by mouth  daily, Normal      STOP taking these medications     ibuprofen (ADVIL,MOTRIN) 200 MG tablet      influenza vac recombinant HA trivalent (FLUBLOK) injection      traZODone (DESYREL) 150 MG tablet      citalopram (CELEXA) 20 MG tablet  Allergies  Allergen Reactions  . Niacin And Related     Occasional flushing, itching   Follow-up Information    Follow up with Benjamin Downing, MD. Schedule an appointment as soon as possible for a visit in 1 week.   Specialty:  Family Medicine   Contact information:   [place] [address] [phone]       Follow up with [facility]. Schedule an appointment as soon as possible for a visit in 1 week.   Specialty:  Pulmonology   Contact information:   [place] [phone]      Follow up with [facility].   Why:  HHRN/HHPT/HHOT/HH Nurse's aide/HH Brewing technologist information:   [address] [place] [address] [phone]       Follow up with [facility].   Why:  Home CPAP,Home 02    Contact information:   [address] [place] [address] [phone]       Follow up with [facility] On 02/08/2015.   Why:  @8p    Contact information:   [address], [place] [phone]       The results of significant diagnostics from this hospitalization (including imaging, microbiology, ancillary and laboratory) are listed below for reference.    Significant Diagnostic Studies: Dg Chest 2 View  12/30/2014  CLINICAL DATA:  Hypoxia.  Altered mental status EXAM: CHEST  2 VIEW COMPARISON:  03/04/2012 FINDINGS: Hypoventilation with mild bibasilar atelectasis. Negative for heart failure or effusion. No definite pneumonia. IMPRESSION: Hypoventilation with bibasilar atelectasis. Electronically Signed   By: Franchot Gallo M.D.   On: 12/30/2014 15:48   Ct Head Wo Contrast  12/30/2014  CLINICAL DATA:  Stroke 3 months ago with left-sided weakness. Progressive confusion. EXAM: CT HEAD WITHOUT CONTRAST TECHNIQUE: Contiguous axial images were obtained from the base of the skull through the vertex without intravenous contrast. COMPARISON:  Brain MR a of 04/02/2012; report not available. Head CT 03/17/2012. FINDINGS: Sinuses/Soft tissues: Minimal fluid left sphenoid sinus. Opacification of the anterior right ethmoid air cells and inferior aspect of the frontal sinuses. Prior surgical changes about the frontal sinuses, chronic. Hyperostosis frontalis interna. Intracranial: Cerebral atrophy. Moderate low density in the periventricular white matter likely related to small vessel disease. Dense vertebral and carotid atherosclerosis. Suspect remote right parietal cortically based infarct, chronic. No mass lesion, hemorrhage, hydrocephalus, acute infarct, intra-axial, or extra-axial fluid collection. IMPRESSION: 1.  No acute intracranial abnormality. 2. Sinus disease 3.  Cerebral atrophy and small vessel ischemic change. 4. Remote right parietal  infarct. Electronically Signed   By: Abigail Miyamoto M.D.   On: 12/30/2014 17:42   Ct Chest Wo Contrast  12/30/2014  CLINICAL DATA:  Altered mental status.  Recent stroke. EXAM: CT CHEST WITHOUT CONTRAST TECHNIQUE: Multidetector CT imaging of the chest was performed following the standard protocol without IV contrast. COMPARISON:  Radiograph 12/30/2014 FINDINGS: Mediastinum/Nodes: No axillary or supraclavicular adenopathy. No mediastinal or hilar adenopathy. No pericardial fluid. Coronary calcifications noted. Esophagus normal. Ectatic aorta. Lungs/Pleura: Mild atelectasis the lung bases. No pulmonary edema, pneumothorax, or infiltrate. Upper abdomen: Limited view of the liver, kidneys, pancreas are unremarkable. Normal adrenal glands. Musculoskeletal: Osteophytosis of thoracic spine. No acute findings. No rib fracture IMPRESSION: 1. Mild basilar atelectasis.  No edema, infiltrate, or pneumothorax. 2. Atherosclerotic calcification aorta Electronically Signed   By: Nicole Kindred  Leonia Reeves M.D.   On: 12/30/2014 17:41   Nm Pulmonary Perf And Vent  12/31/2014  CLINICAL DATA:  Hypoxia EXAM: NUCLEAR MEDICINE VENTILATION - PERFUSION LUNG SCAN TECHNIQUE: Ventilation images were obtained in multiple projections using inhaled aerosol Tc-18m DTPA. Perfusion images were obtained in multiple projections after intravenous injection of Tc-52m MAA. RADIOPHARMACEUTICALS:  32 millicuries [id number] DTPA aerosol inhalation and 4.3 millicuries of [id number] MAA IV COMPARISON:  Chest x-ray 12/30/2014 FINDINGS: Ventilation: Patchy perfusion defects within the lungs. Perfusion: Matching patchy perfusion defects throughout the lungs. No segmental or subsegmental defects. IMPRESSION: Low probability study for pulmonary embolus. Electronically Signed   By: Rolm Baptise M.D.   On: 12/31/2014 12:59   Mr Attempted Daymon Larsen Report  12/31/2014  This examination belongs to an outside facility and is stored here for comparison purposes only.   Contact the originating outside institution for any associated report or interpretation.   Microbiology: Recent Results (from the past 240 hour(s))  MRSA PCR Screening     Status: None   Collection Time: 01/02/15 11:00 AM  Result Value Ref Range Status   MRSA by PCR NEGATIVE NEGATIVE Final    Comment:        The GeneXpert MRSA Assay (FDA approved for NASAL specimens only), is one component of a comprehensive MRSA colonization surveillance program. It is not intended to diagnose MRSA infection nor to guide or monitor treatment for MRSA infections.      Labs: Basic Metabolic Panel:  Recent Labs Lab 01/06/15 0441  NA 138  K 4.0  CL 98*  CO2 34*  GLUCOSE 114*  BUN 16  CREATININE 1.11  CALCIUM 8.8*   Liver Function Tests: No results for input(s): AST, ALT, ALKPHOS, BILITOT, PROT, ALBUMIN in the last 168 hours. No results for input(s): LIPASE, AMYLASE in the last 168 hours. No results for input(s): AMMONIA in the last 168 hours. CBC:  Recent Labs Lab 01/06/15 0441  WBC 12.4*  NEUTROABS 9.1*  HGB 12.7*  HCT 39.4  MCV 86.6  PLT 226   Cardiac Enzymes: No results for input(s): CKTOTAL, CKMB, CKMBINDEX, TROPONINI in the last 168 hours. BNP: BNP (last 3 results) No results for input(s): BNP in the last 8760 hours.  ProBNP (last 3 results) No results for input(s): PROBNP in the last 8760 hours.  CBG: No results for input(s): GLUCAP in the last 168 hours.     SignedHosie Poisson  [facility] 01/10/2015, 9:51 AM

## 2015-01-11 NOTE — Telephone Encounter (Signed)
Spoke with pt's wife. She is aware of RA's recommendation. States that he is not confused or agitated now. Advised her that if he becomes confused/agitated he will have to go to the ER. She agreed and verbalized understanding.

## 2015-01-11 NOTE — Telephone Encounter (Signed)
Has prior CVA and hospital admission for hypercarbic respiratory failure If he is confused, needs to go to the emergency room

## 2015-01-11 NOTE — Telephone Encounter (Signed)
Called and spoke to pt's wife. Pt's wife states the pt is more confused and agitated today and yesterday. Pt wearing 2lpm throughout the day but is refusing to wear CPAP. Pt's wife stated at times he is calm but then pt will suddenly become agitated. Pt does not have pulse/ox at home Advised pt's wife that he will need to go back to the ED because of the s/s. Marland Kitchen Pt refusing and the wife is listening to the pt. Pt's son is coming to the house this afternoon and the pt's wife stated the son will call us this afternoon. There are no current openings in office.   Dr. Elsworth Soho please advise. Thanks.

## 2015-01-27 ENCOUNTER — Inpatient Hospital Stay: Payer: Medicare Other | Admitting: Adult Health

## 2015-02-08 ENCOUNTER — Encounter (HOSPITAL_BASED_OUTPATIENT_CLINIC_OR_DEPARTMENT_OTHER): Payer: Medicare Other

## 2015-02-11 ENCOUNTER — Other Ambulatory Visit: Payer: Self-pay | Admitting: Cardiology

## 2015-02-23 ENCOUNTER — Other Ambulatory Visit: Payer: Self-pay | Admitting: *Deleted

## 2015-02-23 MED ORDER — METOPROLOL SUCCINATE ER 25 MG PO TB24: 25.0000 mg | ORAL_TABLET | Freq: Every day | ORAL | Status: DC

## 2015-03-01 ENCOUNTER — Other Ambulatory Visit: Payer: Self-pay | Admitting: *Deleted

## 2015-03-01 MED ORDER — ISOSORBIDE MONONITRATE ER 30 MG PO TB24: ORAL_TABLET | ORAL | Status: DC

## 2015-03-01 MED ORDER — METOPROLOL SUCCINATE ER 25 MG PO TB24: 25.0000 mg | ORAL_TABLET | Freq: Every day | ORAL | Status: DC

## 2015-03-05 ENCOUNTER — Other Ambulatory Visit: Payer: Self-pay | Admitting: Neurology

## 2015-03-05 ENCOUNTER — Other Ambulatory Visit: Payer: Self-pay | Admitting: Cardiology

## 2015-04-15 ENCOUNTER — Emergency Department (HOSPITAL_COMMUNITY): Payer: Medicare Other

## 2015-04-15 ENCOUNTER — Encounter (HOSPITAL_COMMUNITY): Payer: Self-pay | Admitting: Emergency Medicine

## 2015-04-15 ENCOUNTER — Inpatient Hospital Stay (HOSPITAL_COMMUNITY)
Admission: EM | Admit: 2015-04-15 | Discharge: 2015-04-21 | DRG: 189 | Disposition: A | Discharge status: Hospice | Payer: Medicare Other | Attending: Student in an Organized Health Care Education/Training Program | Admitting: Student in an Organized Health Care Education/Training Program

## 2015-04-15 DIAGNOSIS — G9340 Encephalopathy, unspecified: Secondary | ICD-10-CM | POA: Diagnosis present

## 2015-04-15 DIAGNOSIS — I130 Hypertensive heart and chronic kidney disease with heart failure and stage 1 through stage 4 chronic kidney disease, or unspecified chronic kidney disease: Secondary | ICD-10-CM | POA: Diagnosis present

## 2015-04-15 DIAGNOSIS — N183 Chronic kidney disease, stage 3 (moderate): Secondary | ICD-10-CM | POA: Diagnosis present

## 2015-04-15 DIAGNOSIS — G609 Hereditary and idiopathic neuropathy, unspecified: Secondary | ICD-10-CM | POA: Diagnosis present

## 2015-04-15 DIAGNOSIS — Z7902 Long term (current) use of antithrombotics/antiplatelets: Secondary | ICD-10-CM | POA: Diagnosis not present

## 2015-04-15 DIAGNOSIS — R2232 Localized swelling, mass and lump, left upper limb: Secondary | ICD-10-CM | POA: Diagnosis present

## 2015-04-15 DIAGNOSIS — Z978 Presence of other specified devices: Secondary | ICD-10-CM

## 2015-04-15 DIAGNOSIS — K5909 Other constipation: Secondary | ICD-10-CM | POA: Diagnosis not present

## 2015-04-15 DIAGNOSIS — R1030 Lower abdominal pain, unspecified: Secondary | ICD-10-CM

## 2015-04-15 DIAGNOSIS — J9691 Respiratory failure, unspecified with hypoxia: Secondary | ICD-10-CM

## 2015-04-15 DIAGNOSIS — J9621 Acute and chronic respiratory failure with hypoxia: Principal | ICD-10-CM | POA: Diagnosis present

## 2015-04-15 DIAGNOSIS — B952 Enterococcus as the cause of diseases classified elsewhere: Secondary | ICD-10-CM | POA: Diagnosis not present

## 2015-04-15 DIAGNOSIS — F0150 Vascular dementia without behavioral disturbance: Secondary | ICD-10-CM | POA: Diagnosis present

## 2015-04-15 DIAGNOSIS — E662 Morbid (severe) obesity with alveolar hypoventilation: Secondary | ICD-10-CM | POA: Diagnosis present

## 2015-04-15 DIAGNOSIS — Z66 Do not resuscitate: Secondary | ICD-10-CM | POA: Diagnosis present

## 2015-04-15 DIAGNOSIS — B9689 Other specified bacterial agents as the cause of diseases classified elsewhere: Secondary | ICD-10-CM | POA: Diagnosis present

## 2015-04-15 DIAGNOSIS — E785 Hyperlipidemia, unspecified: Secondary | ICD-10-CM | POA: Diagnosis present

## 2015-04-15 DIAGNOSIS — R262 Difficulty in walking, not elsewhere classified: Secondary | ICD-10-CM | POA: Diagnosis present

## 2015-04-15 DIAGNOSIS — Z960 Presence of urogenital implants: Secondary | ICD-10-CM

## 2015-04-15 DIAGNOSIS — N3090 Cystitis, unspecified without hematuria: Secondary | ICD-10-CM | POA: Diagnosis present

## 2015-04-15 DIAGNOSIS — L304 Erythema intertrigo: Secondary | ICD-10-CM | POA: Diagnosis present

## 2015-04-15 DIAGNOSIS — Z515 Encounter for palliative care: Secondary | ICD-10-CM | POA: Diagnosis not present

## 2015-04-15 DIAGNOSIS — E874 Mixed disorder of acid-base balance: Secondary | ICD-10-CM | POA: Diagnosis present

## 2015-04-15 DIAGNOSIS — I2582 Chronic total occlusion of coronary artery: Secondary | ICD-10-CM | POA: Diagnosis present

## 2015-04-15 DIAGNOSIS — N390 Urinary tract infection, site not specified: Secondary | ICD-10-CM | POA: Diagnosis not present

## 2015-04-15 DIAGNOSIS — J9622 Acute and chronic respiratory failure with hypercapnia: Secondary | ICD-10-CM | POA: Diagnosis present

## 2015-04-15 DIAGNOSIS — J9690 Respiratory failure, unspecified, unspecified whether with hypoxia or hypercapnia: Secondary | ICD-10-CM | POA: Diagnosis present

## 2015-04-15 DIAGNOSIS — F419 Anxiety disorder, unspecified: Secondary | ICD-10-CM | POA: Diagnosis present

## 2015-04-15 DIAGNOSIS — F4489 Other dissociative and conversion disorders: Secondary | ICD-10-CM | POA: Diagnosis not present

## 2015-04-15 DIAGNOSIS — Z7401 Bed confinement status: Secondary | ICD-10-CM

## 2015-04-15 DIAGNOSIS — Z87891 Personal history of nicotine dependence: Secondary | ICD-10-CM | POA: Diagnosis not present

## 2015-04-15 DIAGNOSIS — B965 Pseudomonas (aeruginosa) (mallei) (pseudomallei) as the cause of diseases classified elsewhere: Secondary | ICD-10-CM | POA: Diagnosis present

## 2015-04-15 DIAGNOSIS — N400 Enlarged prostate without lower urinary tract symptoms: Secondary | ICD-10-CM | POA: Diagnosis present

## 2015-04-15 DIAGNOSIS — G9341 Metabolic encephalopathy: Secondary | ICD-10-CM | POA: Diagnosis present

## 2015-04-15 DIAGNOSIS — Z6841 Body Mass Index (BMI) 40.0 and over, adult: Secondary | ICD-10-CM

## 2015-04-15 DIAGNOSIS — R410 Disorientation, unspecified: Secondary | ICD-10-CM | POA: Diagnosis not present

## 2015-04-15 DIAGNOSIS — L89152 Pressure ulcer of sacral region, stage 2: Secondary | ICD-10-CM | POA: Diagnosis present

## 2015-04-15 DIAGNOSIS — J9612 Chronic respiratory failure with hypercapnia: Secondary | ICD-10-CM | POA: Diagnosis present

## 2015-04-15 DIAGNOSIS — E876 Hypokalemia: Secondary | ICD-10-CM | POA: Diagnosis present

## 2015-04-15 DIAGNOSIS — K5900 Constipation, unspecified: Secondary | ICD-10-CM | POA: Diagnosis present

## 2015-04-15 DIAGNOSIS — Z7982 Long term (current) use of aspirin: Secondary | ICD-10-CM

## 2015-04-15 DIAGNOSIS — L8990 Pressure ulcer of unspecified site, unspecified stage: Secondary | ICD-10-CM | POA: Diagnosis present

## 2015-04-15 DIAGNOSIS — I5032 Chronic diastolic (congestive) heart failure: Secondary | ICD-10-CM | POA: Diagnosis present

## 2015-04-15 DIAGNOSIS — I2510 Atherosclerotic heart disease of native coronary artery without angina pectoris: Secondary | ICD-10-CM | POA: Diagnosis present

## 2015-04-15 DIAGNOSIS — I69349 Monoplegia of lower limb following cerebral infarction affecting unspecified side: Secondary | ICD-10-CM | POA: Diagnosis not present

## 2015-04-15 DIAGNOSIS — H9193 Unspecified hearing loss, bilateral: Secondary | ICD-10-CM | POA: Diagnosis present

## 2015-04-15 DIAGNOSIS — J9692 Respiratory failure, unspecified with hypercapnia: Secondary | ICD-10-CM

## 2015-04-15 DIAGNOSIS — Z9981 Dependence on supplemental oxygen: Secondary | ICD-10-CM

## 2015-04-15 DIAGNOSIS — K219 Gastro-esophageal reflux disease without esophagitis: Secondary | ICD-10-CM | POA: Diagnosis present

## 2015-04-15 DIAGNOSIS — I252 Old myocardial infarction: Secondary | ICD-10-CM | POA: Diagnosis not present

## 2015-04-15 LAB — COMPREHENSIVE METABOLIC PANEL
ALT: 10 U/L — ABNORMAL LOW (ref 17–63)
ANION GAP: 12 (ref 5–15)
AST: 14 U/L — AB (ref 15–41)
Albumin: 2.6 g/dL — ABNORMAL LOW (ref 3.5–5.0)
Alkaline Phosphatase: 60 U/L (ref 38–126)
BUN: 13 mg/dL (ref 6–20)
CHLORIDE: 92 mmol/L — AB (ref 101–111)
CO2: 39 mmol/L — ABNORMAL HIGH (ref 22–32)
Calcium: 8.9 mg/dL (ref 8.9–10.3)
Creatinine, Ser: 1.06 mg/dL (ref 0.61–1.24)
GFR calc Af Amer: >60 mL/min (ref >60)
Glucose, Bld: 110 mg/dL — ABNORMAL HIGH (ref 65–99)
POTASSIUM: 3.7 mmol/L (ref 3.5–5.1)
Sodium: 143 mmol/L (ref 135–145)
Total Bilirubin: 0.7 mg/dL (ref 0.3–1.2)
Total Protein: 6.5 g/dL (ref 6.5–8.1)

## 2015-04-15 LAB — I-STAT TROPONIN, ED: Troponin i, poc: 0 ng/mL (ref 0.00–0.08)

## 2015-04-15 LAB — CBC WITH DIFFERENTIAL/PLATELET
Basophils Absolute: 0 10*3/uL (ref 0.0–0.1)
Basophils Relative: 0 %
EOS ABS: 0.5 10*3/uL (ref 0.0–0.7)
EOS PCT: 4 %
HCT: 42.4 % (ref 39.0–52.0)
Hemoglobin: 12.9 g/dL — ABNORMAL LOW (ref 13.0–17.0)
LYMPHS ABS: 1.2 10*3/uL (ref 0.7–4.0)
Lymphocytes Relative: 10 %
MCH: 27.7 pg (ref 26.0–34.0)
MCHC: 30.4 g/dL (ref 30.0–36.0)
MCV: 91 fL (ref 78.0–100.0)
Monocytes Absolute: 0.6 10*3/uL (ref 0.1–1.0)
Monocytes Relative: 6 %
Neutro Abs: 8.9 10*3/uL — ABNORMAL HIGH (ref 1.7–7.7)
Neutrophils Relative %: 80 %
PLATELETS: 213 10*3/uL (ref 150–400)
RBC: 4.66 MIL/uL (ref 4.22–5.81)
RDW: 15.7 % — ABNORMAL HIGH (ref 11.5–15.5)
WBC: 11.2 10*3/uL — AB (ref 4.0–10.5)

## 2015-04-15 LAB — I-STAT ARTERIAL BLOOD GAS, ED
Acid-Base Excess: 16 mmol/L — ABNORMAL HIGH (ref 0.0–2.0)
Bicarbonate: 45.1 mEq/L — ABNORMAL HIGH (ref 20.0–24.0)
O2 Saturation: 94 %
PCO2 ART: 75.3 mmHg — AB (ref 35.0–45.0)
PH ART: 7.383 (ref 7.350–7.450)
Patient temperature: 97.7
TCO2: 47 mmol/L (ref 0–100)
pO2, Arterial: 73 mmHg — ABNORMAL LOW (ref 80.0–100.0)

## 2015-04-15 LAB — POC OCCULT BLOOD, ED: FECAL OCCULT BLD: NEGATIVE

## 2015-04-15 LAB — ETHANOL

## 2015-04-15 LAB — LIPASE, BLOOD: LIPASE: 51 U/L (ref 11–51)

## 2015-04-15 LAB — BRAIN NATRIURETIC PEPTIDE: B NATRIURETIC PEPTIDE 5: 47.6 pg/mL (ref 0.0–100.0)

## 2015-04-15 LAB — I-STAT CG4 LACTIC ACID, ED: LACTIC ACID, VENOUS: 1.38 mmol/L (ref 0.5–2.0)

## 2015-04-15 LAB — AMMONIA: AMMONIA: 22 umol/L (ref 9–35)

## 2015-04-15 MED ORDER — SODIUM CHLORIDE 0.9 % IV SOLN
INTRAVENOUS | Status: DC
  Administered 2015-04-15: 22:00:00 via INTRAVENOUS

## 2015-04-15 MED ORDER — LORAZEPAM 2 MG/ML IJ SOLN
0.5000 mg | Freq: Once | INTRAMUSCULAR | Status: AC
  Administered 2015-04-15: 0.5 mg via INTRAVENOUS
  Filled 2015-04-15: qty 1

## 2015-04-15 NOTE — ED Notes (Signed)
Pt arrives by [facility] with c/o lower abdominal pain and constipation (no BM x 1 month). O2 sats 85% on home oxygen (4L). EMS upped to 6L, sats improved. 12 lead - SB. Last vitals 116/68, P 54, RR 18, O2 94% on 6L, CBG 118. 20g in L AC, no meds. Family also reports periods of confusion since November.

## 2015-04-15 NOTE — Progress Notes (Signed)
Placed patient on BiPAP with IPAP set at 10cm and EPAP set at 5cm. Oxygen set at 60%. B/S decreased.

## 2015-04-15 NOTE — ED Provider Notes (Signed)
CSN: [id number]     Arrival date & time 04/15/15  1939 History   First MD Initiated Contact with Patient 04/15/15 2001     Chief Complaint  Patient presents with  . Constipation  . Abdominal Pain     (Consider location/radiation/quality/duration/timing/severity/associated sxs/prior Treatment) HPI Comments: Benjamin Kaiser is a 81 y.o. male with a PMHx of HTN, seizures, HLD, bradycardia due to atenolol, gout, DVT in RLE not on anticoagulation, CAD/NSTEMI with DES placement, CHF, BPH, CVA with residual left sided weakness, aortic stenosis, GERD, neuropathy, and CKD3, who presents to the ED via EMS with his spouse accompanying him, with complaints of lower abdominal pain and constipation 1 month. LEVEL 5 CAVEAT DUE TO CONFUSION/ALTERED MENTAL STATUS. Patient's spouse reports that he has not had a bowel movement in one month, aside from producing a very small amount of stool today which was not actually formed but rather just some staining in his underwear. They have been using miralax and mag citrate with no relief. He complains of 10/10 sharp constant nonradiating lower abdominal pain which worsens with movement and has been unrelieved with mag citrate and Miralax, but mildly improved with Advil that he took earlier.   Additionally his spouse mentions that he has been having periods of confusion the last 3 months, seems to worsen at night. They noted today that his oxygen level was 85% on his home oxygen of 4 L via nasal cannula, when EMS arrived they gave him 6 L via nasal cannula and improved to 94%. He has been admitted in November of last year for confusion and hypoxia. At that time he had a negative VQ scan this chest and negative Doppler studies of both legs. During that admission, he was placed on BiPaP and improved.  He has an indwelling catheter at baseline. He has baseline left-sided weakness from prior strokes, and baseline numbness and tingling in his legs due to peripheral neuropathy. He  and his wife deny any changes in urine including no malodorous urine or hematuria. He also denies any changes in weakness, numbness, or tingling.   He has with denies any fevers, chills, chest pain, shortness of breath, nausea, vomiting, diarrhea, obstipation, hematochezia, melena in the stool produced earlier, malodorous urine, hematuria, or any other associated symptoms. No recent travel or surgeries, patient does not take a blood thinners, no alcohol use, no sick contacts.  PCP: Leonard Downing, MD in [place].  Patient is a 81 y.o. male presenting with constipation and abdominal pain. The history is provided by the patient, medical records and the spouse. The history is limited by the condition of the patient. No language interpreter was used.  Constipation Severity:  Moderate Time since last bowel movement:  4 weeks Timing:  Constant Progression:  Unchanged Chronicity:  New Stool description:  None produced Relieved by:  Nothing Worsened by:  Nothing tried Ineffective treatments:  Miralax and laxatives Associated symptoms: abdominal pain   Associated symptoms: no diarrhea, no fever, no nausea, no urinary retention and no vomiting   Risk factors: obesity   Risk factors: no change in medication, no recent surgery and no recent travel   Abdominal Pain Associated symptoms: constipation   Associated symptoms: no chest pain, no chills, no diarrhea, no fever, no hematuria, no nausea, no shortness of breath and no vomiting     Past Medical History  Diagnosis Date  . Hypertension   . Seizure disorder ([place])   . Hyperlipidemia   . Bradycardia  secondary to Atenolol  . Gout   . Obesity   . DVT (deep venous thrombosis) ([place]) 2003    Right lower extremity  . Bladder calculi     status cystoscopy  . BPH (benign prostatic hypertrophy)     status post TURP  . History of stroke   . CAD (coronary artery disease)     a. NSTEMI (8/12). LHC with total occlusion LAD and  collaterals from RCA, 90% serial CFX stenoses. Patient was evaluated for CABG but was turned down by CVTS given obesity and poor functional status. He had DES to CFX placed instead.   . Chronic diastolic heart failure ([place])     a. Echo (8/12) with EF 45-50%, anteroseptal hypokinesis, mild LV hypertrophy, normal RV, aortic sclerosis;  b. Echo (3/13) with EF 50-55%, mild AS.   Marland Kitchen Aortic stenosis     a. mild by echo 04/2011  . GERD (gastroesophageal reflux disease)   . Neuropathy ([place])   . Stroke ([place])   . Stage III chronic kidney disease 01/01/2015   Past Surgical History  Procedure Laterality Date  . Transurethral resection of prostate    . Cataract surgery     Family History  Problem Relation Age of Onset  . Lung cancer Father   . Suicidality Son   . Stroke Son   . Heart attack Son   . Heart attack Son   . Fainting Brother    Social History  Substance Use Topics  . Smoking status: Never Smoker   . Smokeless tobacco: Former Systems developer    Types: [place] date: 02/26/1989  . Alcohol Use: No     Comment: Pt reports he use to drink alcohol   LEVEL 5 CAVEAT DUE TO CONFUSION/ALTERED MENTAL STATUS Review of Systems  Unable to perform ROS: Mental status change  Constitutional: Negative for fever and chills.  Respiratory: Negative for shortness of breath.   Cardiovascular: Negative for chest pain.  Gastrointestinal: Positive for abdominal pain and constipation. Negative for nausea, vomiting, diarrhea and anal bleeding.  Genitourinary: Negative for hematuria.  Allergic/Immunologic: Negative for immunocompromised state.  Neurological: Negative for weakness (no new focal weakness, residual L sided weakness from prior strokes) and numbness.  Psychiatric/Behavioral: Positive for confusion.      Allergies  Niacin and related  Home Medications   Prior to Admission medications   Medication Sig Start Date End Date Taking? Authorizing Provider  aspirin EC 81 MG tablet Take 81 mg by  mouth daily.    Historical Provider, MD  atorvastatin (LIPITOR) 80 MG tablet Take 1 tablet by mouth  daily 03/07/15   Larey Dresser, MD  clopidogrel (PLAVIX) 75 MG tablet Take 1 tablet by mouth  daily 08/17/14   Larey Dresser, MD  docusate sodium (COLACE) 100 MG capsule Take 100 mg by mouth 2 (two) times daily as needed for mild constipation.    Historical Provider, MD  furosemide (LASIX) 40 MG tablet Take 1 tablet by mouth  daily Patient taking differently: Take 1 tablet in the morning and 0.5 tablet at lunch time 08/17/14   Larey Dresser, MD  gabapentin (NEURONTIN) 300 MG capsule Take 2 capsules by mouth 3  times daily 03/05/15   Garvin Fila, MD  ipratropium-albuterol (DUONEB) 0.5-2.5 (3) MG/3ML SOLN Take 3 mLs by nebulization every 6 (six) hours as needed. 01/07/15   Hosie Poisson, MD  isosorbide mononitrate (IMDUR) 30 MG 24 hr tablet Take 1 tablet by mouth  daily  03/01/15   Larey Dresser, MD  metoprolol succinate (TOPROL XL) 25 MG 24 hr tablet Take 1 tablet (25 mg total) by mouth daily. 03/01/15   Larey Dresser, MD  Multiple Vitamins-Minerals (EYE VITAMINS) CAPS Take 1 capsule by mouth daily.      Historical Provider, MD  neomycin-bacitracin-polymyxin (NEOSPORIN) ointment Apply 1 application topically as needed for wound care.    Historical Provider, MD  nitroGLYCERIN (NITROSTAT) 0.4 MG SL tablet Place 1 tablet (0.4 mg total) under the tongue every 5 (five) minutes as needed. 10/02/13   Larey Dresser, MD  pantoprazole (PROTONIX) 40 MG tablet Take 1 tablet by mouth  daily 02/11/15   Larey Dresser, MD  Propylene Glycol (SYSTANE BALANCE) 0.6 % SOLN Place 1-2 drops into both eyes at bedtime as needed (for dry eyes).    Historical Provider, MD  Tamsulosin HCl (FLOMAX) 0.4 MG CAPS Take 0.4 mg by mouth daily.    Historical Provider, MD  Vitamins A & D (VITAMIN A & D) ointment Apply 1 application topically as needed for dry skin.    Historical Provider, MD  ZETIA 10 MG tablet Take 1 tablet by mouth   daily 08/17/14   Larey Dresser, MD   BP 134/73 mmHg  Pulse 56  Temp(Src) 97.7 F (36.5 C) (Oral)  Resp 22  SpO2 99% Physical Exam  Constitutional: Vital signs are normal. He appears well-developed and well-nourished.  Non-toxic appearance. No distress.  Afebrile, nontoxic, morbidly obese, elderly and hard of hearing  HENT:  Head: Normocephalic and atraumatic.  Right Ear: Decreased hearing is noted.  Left Ear: Decreased hearing is noted.  Mouth/Throat: Oropharynx is clear and moist. Mucous membranes are dry.  Slightly dry mucous membranes Very hard of hearing  Eyes: Conjunctivae and EOM are normal. Right eye exhibits no discharge. Left eye exhibits no discharge.  Neck: Normal range of motion. Neck supple.  Cardiovascular: Regular rhythm, normal heart sounds and intact distal pulses.  Bradycardia present.  Exam reveals no gallop and no friction rub.   No murmur heard. Bradycardic in the 50-60s, very difficult to auscultate due to body habitus but overall reg rhythm, nl s1/s2, no m/r/g heard, distal pulses intact, 1+ pedal edema bilaterally  Pulmonary/Chest: Effort normal and breath sounds normal. No respiratory distress. He has no decreased breath sounds. He has no wheezes. He has no rhonchi. He has no rales.  Very difficult to auscultate secondary to body habitus, but CTAB in all lung fields, no w/r/r, no increased WOB, SpO2 99% on 6L via [place]  Abdominal: Soft. Normal appearance and bowel sounds are normal. He exhibits no distension. There is tenderness in the right lower quadrant, suprapubic area and left lower quadrant. There is no rigidity, no rebound, no guarding, no tenderness at McBurney's point and negative Murphy's sign.    Morbidly obese which severely limits exam Soft, without obvious distension, +BS throughout, with mild diffuse lower abd TTP, no r/g/r, neg murphy's, neg mcburney's, unable to perform flank exam due to body habitus  Genitourinary: Rectum normal. Rectal exam  shows no external hemorrhoid, no internal hemorrhoid, no fissure, no mass, no tenderness and anal tone normal. Guaiac negative stool. Prostate is not tender.  Chaperone present Exam very limited due to body habitus, difficulty getting pt into proper position for adequate evaluation, but soft stool noted on the bed when he was rolled over No gross blood noted on rectal exam, normal tone, no tenderness, no mass or fissure, no hemorrhoids. FOBT neg.  No fecal impaction, small amount of soft stool felt in the rectum.  Prostate without warmth, tenderness, or bogginess although very limited exam due to body habitus, unable to fully assess size of prostate  Musculoskeletal: Normal range of motion.  MAE x4 Strength and sensation at baseline Distal pulses intact 1+ b/l pedal edema, neg homan's bilaterally   Neurological: He is alert. He has normal strength. No sensory deficit.  Alert and oriented only to person, unable to state place (knows he's in the hospital but unsure of city/state), or year (thinks it's 2013) Unable to perform full neuro exam due to pt's condition  Skin: Skin is warm, dry and intact. No rash noted.  Psychiatric: He has a normal mood and affect.  Nursing note and vitals reviewed.   ED Course  Procedures (including critical care time)  CRITICAL CARE- hypoxia/AMS requiring BiPaP Performed by: Corine Shelter   Total critical care time: 45 minutes  Critical care time was exclusive of separately billable procedures and treating other patients.  Critical care was necessary to treat or prevent imminent or life-threatening deterioration.  Critical care was time spent personally by me on the following activities: development of treatment plan with patient and/or surrogate as well as nursing, discussions with consultants, evaluation of patient's response to treatment, examination of patient, obtaining history from patient or surrogate, ordering and performing  treatments and interventions, ordering and review of laboratory studies, ordering and review of radiographic studies, pulse oximetry and re-evaluation of patient's condition.  Labs Review Labs Reviewed  CBC WITH DIFFERENTIAL/PLATELET - Abnormal; Notable for the following:    WBC 11.2 (*)    Hemoglobin 12.9 (*)    RDW 15.7 (*)    Neutro Abs 8.9 (*)    All other components within normal limits  COMPREHENSIVE METABOLIC PANEL - Abnormal; Notable for the following:    Chloride 92 (*)    CO2 39 (*)    Glucose, Bld 110 (*)    Albumin 2.6 (*)    AST 14 (*)    ALT 10 (*)    All other components within normal limits  I-STAT ARTERIAL BLOOD GAS, ED - Abnormal; Notable for the following:    pCO2 arterial 75.3 (*)    pO2, Arterial 73.0 (*)    Bicarbonate 45.1 (*)    Acid-Base Excess 16.0 (*)    All other components within normal limits  URINE CULTURE  AMMONIA  ETHANOL  LIPASE, BLOOD  BRAIN NATRIURETIC PEPTIDE  URINE RAPID DRUG SCREEN, HOSP PERFORMED  URINALYSIS, ROUTINE W REFLEX MICROSCOPIC (NOT AT [facility])  I-STAT CG4 LACTIC ACID, ED  POC OCCULT BLOOD, ED  I-STAT TROPOININ, ED    Imaging Review Ct Head Wo Contrast  04/15/2015  CLINICAL DATA:  Altered mental status, extreme confusion. History of hypertension, seizure disorder, stroke. EXAM: CT HEAD WITHOUT CONTRAST TECHNIQUE: Contiguous axial images were obtained from the base of the skull through the vertex without intravenous contrast. COMPARISON:  CT head December 30, 2014 FINDINGS: Moderate to severe ventriculomegaly on the basis of global parenchymal brain volume loss. Small area RIGHT frontoparietal encephalomalacia. No intraparenchymal hemorrhage, mass effect nor midline shift. Patchy supratentorial white matter hypodensities are within normal range for patient's age and though non-specific suggest sequelae of chronic small vessel ischemic disease. No acute large vascular territory infarcts. Scattered dural calcifications. No abnormal  extra-axial fluid collections. Basal cisterns are patent. Moderate to severe calcific atherosclerosis of the carotid siphons and included vertebral arteries. No skull fracture. The included ocular globes  and orbital contents are non-suspicious. Status post RIGHT ocular lens implant. Mild to moderate paranasal sinus mucosal thickening, old frontal sinus craniotomy. Moderate RIGHT temporomandibular osteoarthrosis of possible condylar fracture. IMPRESSION: No acute intracranial process. Moderate to severe global brain atrophy. RIGHT frontoparietal encephalomalacia suggesting old infarct. Moderate chronic small vessel ischemic disease. Electronically Signed   By: Elon Alas M.D.   On: 04/15/2015 22:37   Dg Abd Acute W/chest  04/15/2015  CLINICAL DATA:  Lower abdominal pain. Constipation with no bowel movement for the past month. EXAM: DG ABDOMEN ACUTE W/ 1V CHEST COMPARISON:  Chest radiographs dated 12/30/2014 and chest CT dated 12/30/2014. FINDINGS: Poor inspiration. No gross change in enlargement of the cardiac silhouette. Interval elevation of the right hemidiaphragm. Interval mild linear density at both lung bases. Thoracic spine degenerative changes. Normal bowel gas pattern without free peritoneal air. Normal amount of stool. Small caliber tubing overlying the inferior pelvis in the midline. IMPRESSION: 1. Poor inspiration with interval elevation of the right hemidiaphragm and mild bibasilar atelectasis or scarring. 2. Stable cardiomegaly. 3. No acute abdominal abnormality. 4. Normal amount of stool. Electronically Signed   By: Claudie Revering M.D.   On: 04/15/2015 21:24   I have personally reviewed and evaluated these images and lab results as part of my medical decision-making.   EKG Interpretation   Date/Time:  Friday April 15 2015 22:32:00 EST Ventricular Rate:  56 PR Interval:    QRS Duration: 123 QT Interval:  480 QTC Calculation: 463 R Axis:   3 Text Interpretation:  Junctional  rhythm IVCD, consider atypical RBBB  Confirmed by Wilson Singer  MD, STEPHEN ([id number]) on 04/15/2015 10:52:35 PM      MDM   Final diagnoses:  Acute on chronic respiratory failure with hypoxia and hypercapnia (HCC)  Lower abdominal pain  Constipation, unspecified constipation type  Confusion state  Morbid obesity with BMI of 40.0-44.9, adult ([place])    81 y.o. male here with constipation x1 month, worsening lower abd pain. Wife also states he's had worsening confusion, especially at night. Noted to be hypoxic on his 4L oxygen (baseline home use) which improved with 6L via [place]. In Nov 2016 he was admitted for very similar issue, had V/Q scan which was neg, b/l LE dopplers were neg. On exam, oriented to person only, not to place or time, very hard of hearing but appears confused as well. Mild lower abd tenderness diffusely throughout lower abdomen, +BS throughout, nonperitoneal but body habitus limits exam. Lung sounds difficult to auscultate due to body habitus but no rales noted. No tachycardia, no unilateral LE swelling. Will start with labs for AMS, including trop and EKG, will get head CT as well. Will start with Acute abd series, and get rectal exam performed, if necessary may consider CT abd/pelvis but I feel that we may be able to get adequate evaluation of his abd pain with the acute abd series and lab work/rectal exam for now. Will hold off on CT chest, since his kidney function in the past has limited the ability to get these, and since the last time he was here he had the neg v/q scan/dopplers. Will get ABG, U/A with culture, lactic, EtOH, UDS, FOBT, and ammonia along with other labs. Will get EKG as well. Overall broad differential for his many medical issues today, but will proceed with work up in step-wise fashion. Dr. Wilson Singer will see pt as well. Will reassess shortly. Want to hold off with any medications for pain in order to avoid  causing medication induced confusion as well.   8:45 PM Will place on  bipap given the ABG findings with PCO2 of 75.3 and bicarb of 45.1 with PO2 73, acid-base excess of 16.  9:56 PM Finally able to perform rectal exam, no fecal impaction, soft stool noted on the bed and slight amount of soft stool in the rectum. Limited exam due to body habitus but overall no concerning rectal exam findings. Rectal temp same as oral temp from earlier, no fevers or hypothermia. Acute abd series without large stool burden, slight elevation of R hemidiaphragm which has been noted before. Stable cardiomegaly noted. Istat Lactic just now resulting, which is neg. Aside from that, all other labs/imaging/EKG have not yet been done, will await these to get done. Will reassess shortly.   10:26 PM RT stating that pt doesn't want the BiPaP, discussed that this is likely the cause of his confusion and would benefit him the most. Will give small dose of ativan to help relax pt slightly so that he may consider wearing it. Of note, trop neg, CBC w/diff showing mildly elevated WBC at 11.2. BNP, lipase, EtOH, CMP, and ammonia still pending. U/A not yet done. CT head pending. EKG not yet performed, asked nursing staff to please get this done now. FOBT neg. Will reassess shortly  10:57 PM Ammonia WNL. CMP with hypercapnia similar to where it was last admission, otherwise WNL. EtOH neg. Lipase WNL. BNP WNL. CT head without acute findings, shows chronic small vessel disease and moderate/severe global brain atrophy, as well as R frontoparietal encephalomalacia suggesting old infarct. EKG with junctional rhythm and possible RBBB which is similar to prior EKGs. Will proceed with admission despite not having the U/A back yet, but I feel this will not change my decision for admission. Dr. Wilson Singer agrees with this plan.   11:20 PM Dr. Denton Brick of IM residency service returning page, will admit to stepdown service. Please see her notes for further documentation of care.   BP 140/76 mmHg  Pulse 54  Temp(Src) 97.6 F  (36.4 C) (Rectal)  Resp 20  SpO2 100%  Meds ordered this encounter  Medications  . 0.9 %  sodium chloride infusion    Sig:   . LORazepam (ATIVAN) injection 0.5 mg    Sig:        Chrystina Naff Camprubi-Soms, PA-C 04/15/15 2320  Virgel Manifold, MD 04/18/15 [phone]

## 2015-04-16 ENCOUNTER — Inpatient Hospital Stay (HOSPITAL_COMMUNITY): Payer: Medicare Other

## 2015-04-16 DIAGNOSIS — L89152 Pressure ulcer of sacral region, stage 2: Secondary | ICD-10-CM

## 2015-04-16 DIAGNOSIS — N183 Chronic kidney disease, stage 3 (moderate): Secondary | ICD-10-CM

## 2015-04-16 DIAGNOSIS — L304 Erythema intertrigo: Secondary | ICD-10-CM

## 2015-04-16 DIAGNOSIS — Z7982 Long term (current) use of aspirin: Secondary | ICD-10-CM

## 2015-04-16 DIAGNOSIS — I130 Hypertensive heart and chronic kidney disease with heart failure and stage 1 through stage 4 chronic kidney disease, or unspecified chronic kidney disease: Secondary | ICD-10-CM

## 2015-04-16 DIAGNOSIS — N400 Enlarged prostate without lower urinary tract symptoms: Secondary | ICD-10-CM

## 2015-04-16 DIAGNOSIS — G9340 Encephalopathy, unspecified: Secondary | ICD-10-CM

## 2015-04-16 DIAGNOSIS — Z7401 Bed confinement status: Secondary | ICD-10-CM

## 2015-04-16 DIAGNOSIS — J9690 Respiratory failure, unspecified, unspecified whether with hypoxia or hypercapnia: Secondary | ICD-10-CM | POA: Diagnosis present

## 2015-04-16 DIAGNOSIS — Z960 Presence of urogenital implants: Secondary | ICD-10-CM

## 2015-04-16 DIAGNOSIS — I5030 Unspecified diastolic (congestive) heart failure: Secondary | ICD-10-CM

## 2015-04-16 DIAGNOSIS — K5900 Constipation, unspecified: Secondary | ICD-10-CM | POA: Diagnosis present

## 2015-04-16 DIAGNOSIS — F419 Anxiety disorder, unspecified: Secondary | ICD-10-CM

## 2015-04-16 DIAGNOSIS — Z978 Presence of other specified devices: Secondary | ICD-10-CM

## 2015-04-16 DIAGNOSIS — E785 Hyperlipidemia, unspecified: Secondary | ICD-10-CM

## 2015-04-16 DIAGNOSIS — G40909 Epilepsy, unspecified, not intractable, without status epilepticus: Secondary | ICD-10-CM

## 2015-04-16 DIAGNOSIS — G609 Hereditary and idiopathic neuropathy, unspecified: Secondary | ICD-10-CM

## 2015-04-16 DIAGNOSIS — J9612 Chronic respiratory failure with hypercapnia: Secondary | ICD-10-CM

## 2015-04-16 DIAGNOSIS — E662 Morbid (severe) obesity with alveolar hypoventilation: Secondary | ICD-10-CM | POA: Diagnosis present

## 2015-04-16 DIAGNOSIS — R262 Difficulty in walking, not elsewhere classified: Secondary | ICD-10-CM | POA: Diagnosis present

## 2015-04-16 DIAGNOSIS — K219 Gastro-esophageal reflux disease without esophagitis: Secondary | ICD-10-CM

## 2015-04-16 DIAGNOSIS — I2510 Atherosclerotic heart disease of native coronary artery without angina pectoris: Secondary | ICD-10-CM

## 2015-04-16 DIAGNOSIS — Z8673 Personal history of transient ischemic attack (TIA), and cerebral infarction without residual deficits: Secondary | ICD-10-CM

## 2015-04-16 DIAGNOSIS — K5909 Other constipation: Secondary | ICD-10-CM

## 2015-04-16 DIAGNOSIS — Z9981 Dependence on supplemental oxygen: Secondary | ICD-10-CM

## 2015-04-16 DIAGNOSIS — F4489 Other dissociative and conversion disorders: Secondary | ICD-10-CM | POA: Diagnosis present

## 2015-04-16 DIAGNOSIS — Z955 Presence of coronary angioplasty implant and graft: Secondary | ICD-10-CM

## 2015-04-16 DIAGNOSIS — R2231 Localized swelling, mass and lump, right upper limb: Secondary | ICD-10-CM

## 2015-04-16 LAB — URINE MICROSCOPIC-ADD ON

## 2015-04-16 LAB — URINALYSIS, ROUTINE W REFLEX MICROSCOPIC
BILIRUBIN URINE: NEGATIVE
Glucose, UA: NEGATIVE mg/dL
Ketones, ur: NEGATIVE mg/dL
NITRITE: NEGATIVE
PROTEIN: NEGATIVE mg/dL
Specific Gravity, Urine: 1.012 (ref 1.005–1.030)
pH: 6 (ref 5.0–8.0)

## 2015-04-16 LAB — RAPID URINE DRUG SCREEN, HOSP PERFORMED
Amphetamines: NOT DETECTED
BARBITURATES: NOT DETECTED
BENZODIAZEPINES: POSITIVE — AB
COCAINE: NOT DETECTED
Opiates: NOT DETECTED
Tetrahydrocannabinol: NOT DETECTED

## 2015-04-16 LAB — MRSA PCR SCREENING: MRSA BY PCR: NEGATIVE

## 2015-04-16 MED ORDER — FUROSEMIDE 40 MG PO TABS
40.0000 mg | ORAL_TABLET | Freq: Every day | ORAL | Status: DC
  Administered 2015-04-16 – 2015-04-21 (×6): 40 mg via ORAL
  Filled 2015-04-16 (×4): qty 1
  Filled 2015-04-16: qty 2
  Filled 2015-04-16: qty 1

## 2015-04-16 MED ORDER — ATORVASTATIN CALCIUM 80 MG PO TABS
80.0000 mg | ORAL_TABLET | Freq: Every day | ORAL | Status: DC
  Administered 2015-04-16 – 2015-04-19 (×4): 80 mg via ORAL
  Filled 2015-04-16 (×4): qty 1

## 2015-04-16 MED ORDER — ACETAMINOPHEN 325 MG PO TABS: 650.0000 mg | ORAL_TABLET | Freq: Four times a day (QID) | ORAL | Status: DC | PRN

## 2015-04-16 MED ORDER — ISOSORBIDE MONONITRATE ER 30 MG PO TB24
30.0000 mg | ORAL_TABLET | Freq: Every day | ORAL | Status: DC
  Administered 2015-04-16 – 2015-04-21 (×6): 30 mg via ORAL
  Filled 2015-04-16 (×7): qty 1

## 2015-04-16 MED ORDER — EZETIMIBE 10 MG PO TABS
10.0000 mg | ORAL_TABLET | Freq: Every day | ORAL | Status: DC
  Administered 2015-04-16 – 2015-04-20 (×5): 10 mg via ORAL
  Filled 2015-04-16 (×6): qty 1

## 2015-04-16 MED ORDER — GABAPENTIN 300 MG PO CAPS
600.0000 mg | ORAL_CAPSULE | Freq: Three times a day (TID) | ORAL | Status: DC
  Administered 2015-04-16 – 2015-04-21 (×16): 600 mg via ORAL
  Filled 2015-04-16 (×9): qty 2
  Filled 2015-04-16: qty 6
  Filled 2015-04-16 (×7): qty 2

## 2015-04-16 MED ORDER — CETYLPYRIDINIUM CHLORIDE 0.05 % MT LIQD
7.0000 mL | Freq: Two times a day (BID) | OROMUCOSAL | Status: DC
  Administered 2015-04-16 – 2015-04-21 (×10): 7 mL via OROMUCOSAL

## 2015-04-16 MED ORDER — FLEET ENEMA 7-19 GM/118ML RE ENEM
1.0000 | ENEMA | Freq: Once | RECTAL | Status: DC | PRN
  Filled 2015-04-16: qty 1

## 2015-04-16 MED ORDER — ENOXAPARIN SODIUM 40 MG/0.4ML ~~LOC~~ SOLN
40.0000 mg | Freq: Every day | SUBCUTANEOUS | Status: DC
  Administered 2015-04-16 – 2015-04-17 (×2): 40 mg via SUBCUTANEOUS
  Filled 2015-04-16 (×3): qty 0.4

## 2015-04-16 MED ORDER — NITROGLYCERIN 0.4 MG SL SUBL: 0.4000 mg | SUBLINGUAL_TABLET | SUBLINGUAL | Status: DC | PRN

## 2015-04-16 MED ORDER — SENNOSIDES-DOCUSATE SODIUM 8.6-50 MG PO TABS
2.0000 | ORAL_TABLET | Freq: Two times a day (BID) | ORAL | Status: DC
  Administered 2015-04-16 – 2015-04-21 (×8): 2 via ORAL
  Filled 2015-04-16 (×11): qty 2

## 2015-04-16 MED ORDER — CITALOPRAM HYDROBROMIDE 20 MG PO TABS
20.0000 mg | ORAL_TABLET | Freq: Every day | ORAL | Status: DC
  Administered 2015-04-16 – 2015-04-19 (×4): 20 mg via ORAL
  Filled 2015-04-16 (×4): qty 1

## 2015-04-16 MED ORDER — NYSTATIN 100000 UNIT/GM EX POWD
Freq: Two times a day (BID) | CUTANEOUS | Status: DC
  Administered 2015-04-16: 16:00:00 via TOPICAL
  Administered 2015-04-16: 1 g via TOPICAL
  Administered 2015-04-17 (×2): via TOPICAL
  Administered 2015-04-18: 1 g via TOPICAL
  Administered 2015-04-18 – 2015-04-21 (×6): via TOPICAL
  Filled 2015-04-16 (×2): qty 15

## 2015-04-16 MED ORDER — PANTOPRAZOLE SODIUM 40 MG PO TBEC
40.0000 mg | DELAYED_RELEASE_TABLET | Freq: Every day | ORAL | Status: DC
  Administered 2015-04-16 – 2015-04-19 (×4): 40 mg via ORAL
  Filled 2015-04-16 (×4): qty 1

## 2015-04-16 MED ORDER — ASPIRIN EC 81 MG PO TBEC
81.0000 mg | DELAYED_RELEASE_TABLET | Freq: Every day | ORAL | Status: DC
  Administered 2015-04-16: 81 mg via ORAL
  Filled 2015-04-16: qty 1

## 2015-04-16 MED ORDER — ACETAMINOPHEN 650 MG RE SUPP: 650.0000 mg | Freq: Four times a day (QID) | RECTAL | Status: DC | PRN

## 2015-04-16 MED ORDER — CLOPIDOGREL BISULFATE 75 MG PO TABS
75.0000 mg | ORAL_TABLET | Freq: Every day | ORAL | Status: DC
  Administered 2015-04-16 – 2015-04-21 (×6): 75 mg via ORAL
  Filled 2015-04-16 (×6): qty 1

## 2015-04-16 MED ORDER — METOPROLOL SUCCINATE ER 25 MG PO TB24
25.0000 mg | ORAL_TABLET | Freq: Every day | ORAL | Status: DC
  Administered 2015-04-16 – 2015-04-21 (×6): 25 mg via ORAL
  Filled 2015-04-16 (×6): qty 1

## 2015-04-16 MED ORDER — ALPRAZOLAM 0.25 MG PO TABS
0.2500 mg | ORAL_TABLET | Freq: Every evening | ORAL | Status: DC | PRN
  Administered 2015-04-16: 0.25 mg via ORAL
  Filled 2015-04-16: qty 1

## 2015-04-16 NOTE — Progress Notes (Signed)
CSW received consult for assistance with getting Bipap at home, however this is a Case Management consult. CSW informed CM. CSW to sign off.   Lucius Conn, [facility] Ph: [phone]

## 2015-04-16 NOTE — Progress Notes (Signed)
Removed BiPAP and placed patient on 5L nasal cannula due to patient pulling and ripping mask off. Patient is resting comfortably on cannula with a SpO2 of 94% RR of 20 and HR of 62. Physician is aware and wants patient to be placed back on BiPAP in 30-40 min. RT will continue to monitor.

## 2015-04-16 NOTE — ED Notes (Signed)
Rounding team at bedside states pt O2 sats to be between 88% and 90%. O2 turned down to 4L nasal cannula by MD.

## 2015-04-16 NOTE — ED Notes (Signed)
MD at bedside. 

## 2015-04-16 NOTE — H&P (Signed)
Date: 04/16/2015               Patient Name:  Benjamin Kaiser MRN: [id number]  DOB: 08/28/1934 Age / Sex: 81 y.o., male   PCP: Leonard Downing, MD         Medical Service: Internal Medicine Teaching Service         Attending Physician: Dr. Oval Linsey, MD    First Contact: Dr. Merrilyn Puma Pager: [phone]  Second Contact: Dr. Posey Pronto Pager: [phone]       After Hours (After 5p/  First Contact Pager: [phone]  weekends / holidays): Second Contact Pager: [phone]   Chief Complaint: AMS. constipation  History of Present Illness: Patient is a 81 yo M with a PMHx of CAD, dCHF, HTN, HLD, morbid obesity, CVA, stage III CKD, seizure disorder, bradycardia, gout, DVT of RLE, bladder calculi, BPH s/p TURP, GERD, peripheral neuropathy presenting to the hospital for constipation and AMS. He was on Bipap making it difficult for him to speak, as such, history was provided by wife at bedside. Wife states patient has been forgetful and confused for the past 2 weeks. States he had a UTI a few months ago and was treated at that time. Patient does have an indwelling foley catheter which is chronic. Foley was placed for convenience because patient is not able to move due to his large body habitus. Catheter is changed once a month, last changed in the beginning of this month. Patient's wife denied overdose of any medications or alcohol use. Not on any opioids. No recent falls. States he had seizure activity several years ago, now not on medication anymore. No recent seizure activity. No fevers, headaches, photophobia, or neck stiffness. Previous admission in 12/2014 for similar presentation which was thought to be due to acute on chronic resp failure 2/2 CO2 narcosis. Patient was discharged with CPAP which he did not tolerate and did not use. He is currently on 2L home O2 via [place].   Wife also saying patient has failed to have a bowel movement in 3 weeks despite eating normally. Miralax and Magnesium citrate have been  tried but to no avail. Today she noticed streaks of fecal matter on his bedsheet but no BM yet. Patient has been complaining of lower abdominal pain. No nausea, vomiting, or blood per rectum. No opioid use. No past abdominal surgeries.   Wife called EMS because patient did not have a bowel movement in 3 weeks. EMS found the patient to be hypoxic (O2 saturation 85% on 4L home oxygen). O2 saturation improved to 94% on 6L. In the ED, patient's blood work was done, imaging studies CT head and abd/CXR done, and he was placed on Bipap.     Meds: No current facility-administered medications for this encounter.   Current Outpatient Prescriptions  Medication Sig Dispense Refill  . ALPRAZolam (XANAX) 0.25 MG tablet Take 0.25 mg by mouth at bedtime as needed for anxiety.     Marland Kitchen aspirin EC 81 MG tablet Take 81 mg by mouth daily.    Marland Kitchen atorvastatin (LIPITOR) 80 MG tablet Take 1 tablet by mouth  daily 90 tablet 3  . clopidogrel (PLAVIX) 75 MG tablet Take 1 tablet by mouth  daily 90 tablet 3  . docusate sodium (COLACE) 100 MG capsule Take 100 mg by mouth 2 (two) times daily as needed for mild constipation.    . furosemide (LASIX) 40 MG tablet Take 1 tablet by mouth  daily 90 tablet 3  . gabapentin (NEURONTIN)  300 MG capsule Take 2 capsules by mouth 3  times daily 540 capsule 0  . ipratropium-albuterol (DUONEB) 0.5-2.5 (3) MG/3ML SOLN Take 3 mLs by nebulization every 6 (six) hours as needed. (Patient taking differently: Take 3 mLs by nebulization every 6 (six) hours as needed (for SOB). ) 360 mL 2  . isosorbide mononitrate (IMDUR) 30 MG 24 hr tablet Take 1 tablet by mouth  daily 30 tablet 2  . metoprolol succinate (TOPROL-XL) 50 MG 24 hr tablet Take 25 mg by mouth daily. Take with or immediately following a meal.    . Multiple Vitamins-Minerals (EYE VITAMINS) CAPS Take 1 capsule by mouth daily.      Marland Kitchen neomycin-bacitracin-polymyxin (NEOSPORIN) ointment Apply 1 application topically as needed for wound care.    .  nitroGLYCERIN (NITROSTAT) 0.4 MG SL tablet Place 1 tablet (0.4 mg total) under the tongue every 5 (five) minutes as needed. 25 tablet prn  . OXYGEN Inhale 2-4 L into the lungs continuous.    . polyethylene glycol (MIRALAX / GLYCOLAX) packet Take 17 g by mouth 2 (two) times daily.    Marland Kitchen Propylene Glycol (SYSTANE BALANCE) 0.6 % SOLN Place 1-2 drops into both eyes at bedtime as needed (for dry eyes).    . Tamsulosin HCl (FLOMAX) 0.4 MG CAPS Take 0.4 mg by mouth daily.    . Vitamins A & D (VITAMIN A & D) ointment Apply 1 application topically as needed for dry skin.    Marland Kitchen ZETIA 10 MG tablet Take 1 tablet by mouth  daily (Patient taking differently: Take 1 tablet by mouth  daily in evening) 90 tablet 3  . metoprolol succinate (TOPROL XL) 25 MG 24 hr tablet Take 1 tablet (25 mg total) by mouth daily. 30 tablet 2  . pantoprazole (PROTONIX) 40 MG tablet Take 1 tablet by mouth  daily 90 tablet 0    Allergies: Allergies as of 04/15/2015 - Review Complete 04/15/2015  Allergen Reaction Noted  . Niacin and related Other (See Comments) 06/21/2014   Past Medical History  Diagnosis Date  . Hypertension   . Seizure disorder ([place])   . Hyperlipidemia   . Bradycardia     secondary to Atenolol  . Gout   . Obesity   . DVT (deep venous thrombosis) ([place]) 2003    Right lower extremity  . Bladder calculi     status cystoscopy  . BPH (benign prostatic hypertrophy)     status post TURP  . History of stroke   . CAD (coronary artery disease)     a. NSTEMI (8/12). LHC with total occlusion LAD and collaterals from RCA, 90% serial CFX stenoses. Patient was evaluated for CABG but was turned down by CVTS given obesity and poor functional status. He had DES to CFX placed instead.   . Chronic diastolic heart failure ([place])     a. Echo (8/12) with EF 45-50%, anteroseptal hypokinesis, mild LV hypertrophy, normal RV, aortic sclerosis;  b. Echo (3/13) with EF 50-55%, mild AS.   Marland Kitchen Aortic stenosis     a. mild by echo 04/2011    . GERD (gastroesophageal reflux disease)   . Neuropathy ([place])   . Stroke ([place])   . Stage III chronic kidney disease 01/01/2015   Past Surgical History  Procedure Laterality Date  . Transurethral resection of prostate    . Cataract surgery     Family History  Problem Relation Age of Onset  . Lung cancer Father   . Suicidality Son   .  Stroke Son   . Heart attack Son   . Heart attack Son   . Fainting Brother    Social History   Social History  . Marital Status: Married    Spouse Name: Barbaraann Share   . Number of Children: 4  . Years of Education: 12   Occupational History  . Not on file.   Social History Main Topics  . Smoking status: Never Smoker   . Smokeless tobacco: Former Systems developer    Types: [place] date: 02/26/1989  . Alcohol Use: No     Comment: Pt reports he use to drink alcohol  . Drug Use: No  . Sexual Activity: Not on file   Other Topics Concern  . Not on file   Social History Narrative   Patient is married and has four sons, Only one still living.  Lives in [place]   Patient is right handed.   Patient has high school degree   Patient drinks 4 cups of caffeine daily    Review of Systems: Review of Systems  Constitutional: Negative for fever and chills.  HENT: Negative for congestion.   Eyes: Negative for pain and discharge.  Respiratory: Negative for cough, sputum production, shortness of breath and wheezing.   Cardiovascular: Negative for chest pain and leg swelling.  Gastrointestinal: Positive for abdominal pain and constipation. Negative for nausea, vomiting and blood in stool.  Genitourinary: Negative for dysuria.  Musculoskeletal: Negative for myalgias.  Skin: Positive for rash.       Sacral ulcers Growth on L forearm   Neurological: Negative for headaches.       Left leg weakness (chronic) Numbness of feet (chronic)    Physical Exam: Blood pressure 99/59, pulse 57, temperature 97.6 F (36.4 C), temperature source Rectal, resp. rate 16, SpO2  99 %. Physical Exam  Constitutional: He appears well-developed and well-nourished.  HENT:  Head: Normocephalic and atraumatic.  Eyes: EOM are normal. Pupils are equal, round, and reactive to light.  Neck: Neck supple. No tracheal deviation present.  JVD difficult to appreciate due to patient's large body habitus.   Cardiovascular:  Difficult to appreciate the nature of heart sounds due to large body habitus.   Pulmonary/Chest:  On Bipap CTAB. No wheezing, rales, or rhonchi appreciated.   Abdominal: Soft. There is no rebound and no guarding.  Obese  Hypoactive bowel sounds  Mild LLQ abdominal pain  Musculoskeletal:  Trace pitting edema of b/l LEs  Neurological: He is alert.  Oriented to person and place only.  Unable to do neurological exam due to lack of patient cooperation. However, wife stated patient has mild residual L lower extremity deficit from prior strokes. Patient was moving his upper extremities spontaneously.   Skin: Rash noted.  Rash noted in skin folds in the inguinal area.  6x6 cm ulcerating mass on the dorsum of left forearm  2 superficial sacral ulcers     Lab results: Basic Metabolic Panel:  Recent Labs  04/15/15 2145  NA 143  K 3.7  CL 92*  CO2 39*  GLUCOSE 110*  BUN 13  CREATININE 1.06  CALCIUM 8.9   Creatinine improved from baseline 1.3-1.4.   Liver Function Tests:  Recent Labs  04/15/15 2145  AST 14*  ALT 10*  ALKPHOS 60  BILITOT 0.7  PROT 6.5  ALBUMIN 2.6*    Recent Labs  04/15/15 2145  LIPASE 51    Recent Labs  04/15/15 2145  AMMONIA 22   CBC:  Recent  Labs  04/15/15 2145  WBC 11.2*  NEUTROABS 8.9*  HGB 12.9*  HCT 42.4  MCV 91.0  PLT 213  WBC count is chronically elevated and close to baseline.  Hgb was 12.7 in 12/2014.   Urine Drug Screen: Drugs of Abuse     Component Value Date/Time   LABOPIA NONE DETECTED 04/15/2015 2338   COCAINSCRNUR NONE DETECTED 04/15/2015 2338   LABBENZ POSITIVE* 04/15/2015 2338     AMPHETMU NONE DETECTED 04/15/2015 2338   THCU NONE DETECTED 04/15/2015 2338   LABBARB NONE DETECTED 04/15/2015 2338    Alcohol Level:  Recent Labs  04/15/15 2145  ETH <5   Urinalysis:  Recent Labs  04/15/15 2338  COLORURINE YELLOW  LABSPEC 1.012  PHURINE 6.0  GLUCOSEU NEGATIVE  HGBUR MODERATE*  BILIRUBINUR NEGATIVE  KETONESUR NEGATIVE  PROTEINUR NEGATIVE  NITRITE NEGATIVE  LEUKOCYTESUR LARGE*   Imaging results:  Ct Head Wo Contrast  04/15/2015  CLINICAL DATA:  Altered mental status, extreme confusion. History of hypertension, seizure disorder, stroke. EXAM: CT HEAD WITHOUT CONTRAST TECHNIQUE: Contiguous axial images were obtained from the base of the skull through the vertex without intravenous contrast. COMPARISON:  CT head December 30, 2014 FINDINGS: Moderate to severe ventriculomegaly on the basis of global parenchymal brain volume loss. Small area RIGHT frontoparietal encephalomalacia. No intraparenchymal hemorrhage, mass effect nor midline shift. Patchy supratentorial white matter hypodensities are within normal range for patient's age and though non-specific suggest sequelae of chronic small vessel ischemic disease. No acute large vascular territory infarcts. Scattered dural calcifications. No abnormal extra-axial fluid collections. Basal cisterns are patent. Moderate to severe calcific atherosclerosis of the carotid siphons and included vertebral arteries. No skull fracture. The included ocular globes and orbital contents are non-suspicious. Status post RIGHT ocular lens implant. Mild to moderate paranasal sinus mucosal thickening, old frontal sinus craniotomy. Moderate RIGHT temporomandibular osteoarthrosis of possible condylar fracture. IMPRESSION: No acute intracranial process. Moderate to severe global brain atrophy. RIGHT frontoparietal encephalomalacia suggesting old infarct. Moderate chronic small vessel ischemic disease. Electronically Signed   By: Elon Alas M.D.    On: 04/15/2015 22:37   Dg Abd Acute W/chest  04/15/2015  CLINICAL DATA:  Lower abdominal pain. Constipation with no bowel movement for the past month. EXAM: DG ABDOMEN ACUTE W/ 1V CHEST COMPARISON:  Chest radiographs dated 12/30/2014 and chest CT dated 12/30/2014. FINDINGS: Poor inspiration. No gross change in enlargement of the cardiac silhouette. Interval elevation of the right hemidiaphragm. Interval mild linear density at both lung bases. Thoracic spine degenerative changes. Normal bowel gas pattern without free peritoneal air. Normal amount of stool. Small caliber tubing overlying the inferior pelvis in the midline. IMPRESSION: 1. Poor inspiration with interval elevation of the right hemidiaphragm and mild bibasilar atelectasis or scarring. 2. Stable cardiomegaly. 3. No acute abdominal abnormality. 4. Normal amount of stool. Electronically Signed   By: Claudie Revering M.D.   On: 04/15/2015 21:24    Other results: EKG: no P waves,QRS interval 123, normal axis. EKG findings consistent with junctional rhythm (chronic). Findings similar to previous tracings. No acute ST/T wave abnormalities.   Assessment & Plan by Problem: Principal Problem:   Hypercapnic respiratory failure, chronic (HCC) Active Problems:   CAD (coronary artery disease)   Hypertension   Morbid obesity (HCC)   CVA (cerebral vascular accident) ([place])   Hereditary and idiopathic peripheral neuropathy   Pressure ulcer   Stage III chronic kidney disease   Unable to ambulate   Chronic indwelling Foley catheter   Respiratory  failure (HCC)  Acute encephalopathy 2/2 chronic hypoxic hypercapnic respiratory failure  Likely in the setting of OHS/OSA and possible contribution from R hemidiaphragm paralysis. Patient was recently discharged in 12/2014 with Cpap but was non-compliant. CXR showing elevation of R hemidiaphragm and mild bibasilar atelectasis/ scarring. ABG done in the ED revealed pH 7.38, pCO2 75.3. PO2 73 (pCO2 was 59 on  prior admission in November and 49 a few days prior to discharge). Bicarb on BMET elevated at 39. Results suggest a chronic respiratory acidosis with appropriate metabolic compensation. Echo findings and BMP on this admission do not suggest heart failure as the etiology of the patient's respiratory failure. He is compliant with his home Lasix making fluid overload less likely. CT head showing no acute intracranial process. Moderate to severe global brain atrophy, right frontoparietal encephalomalacia suggesting old infarct, and moderate chronic small vessel ischemic disease. His mental status improved after being placed on Bipap in the ED. Patient does have a chronic indwelling foley catheter with dirty UA but not convincing for ongoing infection at this time.  -Admit to stepdown -Continue on Bipap overnight. Repeat ABG in am to check respiratory status. -D/c IVF  -No antibiotics indicated at this time  -F/u urine culture  -PT eval  -Social work consult to obtain Bipap  Constipation Exam showing mild LLQ abdominal pain but no rebound, guarding, or rigidity. Abdominal x ray showing normal amount of stool. Patient has already tried Miralax and mag citate which did not help. -Fleet enema  -Senokot   Stage 2 Sacral ulcers Likely in the setting of patient being immobile.  -Wound care consult. Appreciate recs.   Intertriago  Rash noted in skin folds in the inguinal area. -Nystatin powder   Ulcerating mass of LUE -Follow up with PCP and outpatient dermatology -Consider general surgery consult in am   CAD S/p PCI to CFX. Also had chronic total occlusion of LAD with collaterals from the RCA. Patient was seen by CVTS and deemed a poor CABG candidate due to obesity and poor functional status.  -Aspirin 81 mg daily  -Continue Imdur 30 mg daily  -Continue Metoprolol 25 mg daily  -Continue SL Nitroglycerin 0.4 mg as needed   dCHF Echo from 12/2014 showing LVEF 55-60%, mild LVH, grade 1 diastolic  dysfunction, mild aortic stenosis, mild LA dilation, mild RV dilation, and mild RA dilation. -continue Lasix 40 mg daily  -Continue Metoprolol 25 mg daily   HTN -Continue Metoprolol 25 mg daily  -Continue Lasix 40 mg daily   HLD -cont Lipitor 80 mg daily  -cont Zetia 10 mg daily   History of CVA -cont Plavix 75 mg daily -cont Aspirin 81 mg daily  History of Seizure Longstanding use of Dilantin since the 90s following a solitary seizure. Last level of Dilantin in 04/2012 was 5.7. As per neurology note from 05/2014, patient was tapered off of this mediation because it can cause gait disorders.  -No acute intervention needed at this time   Stage III CKD SCr 1.06, improved from baseline 1.3-1.4. -Continue to monitor renal function   GERD  -cont Protonix 40 mg daily   BPH -HOLD Tamsulosin for now. Patient already has a foley in place.  -May need urology follow-up as outpatient   Hereditary and Idiopathic peripheral neuropathy As per neurology note from 05/2014, EMG nerve conduction study revealed severe peripheral neuropathy and serum protein electrophoresis was normal. B12 was normal in 04/2012. -Continue Gabapentin 600 mg TID  Anxiety Patient is taking Xanax 0.25 mg twice  daily at home. Xanax can cause blunt the respiratory drive.  -Decrease dose to Xanax 0.25 mg once daily -Started SSRI (Citalopram 20 mg daily)  DVT ppx: Lovenox  Diet: heart healthy   Dispo: Disposition is deferred at this time, awaiting improvement of current medical problems. Anticipated discharge in approximately 1-2 day(s).   The patient does have a current PCP [name], MD) and does need an [facility] hospital follow-up appointment after discharge.  The patient does have transportation limitations that hinder transportation to clinic appointments.  Signed: Shela Leff, MD 04/16/2015, 3:54 AM

## 2015-04-16 NOTE — ED Notes (Signed)
Per night shift RN, patient has been unwilling to keep Bipap mask on. Pt placed on 6L nasal cannula by night shift RN and patient O2 sats at 93%. Will notify admitting

## 2015-04-16 NOTE — Progress Notes (Signed)
Attempted to place BiPAP back on patient, but after wearing for a few moments, he ripped the mask off and would not let me place it back on. Placed patient back on 5L nasal cannula.

## 2015-04-16 NOTE — Progress Notes (Signed)
Subjective: [name] is breathing more comfortably this morning but is still confused, although improved from initial presentation per his wife. Currently satting low 90s on 4L, down to 91 on 2L. He still has not had a solid BM, and has some mild generalized cramping. He otherwise has no new complaints or other symptoms at this time.  Objective: Vital signs in last 24 hours: Filed Vitals:   04/16/15 0630 04/16/15 0731 04/16/15 0930 04/16/15 1020  BP: 109/72 106/59 126/73   Pulse: 57 60 59 62  Temp:      TempSrc:      Resp: 19  24   SpO2: 96% 96% 94%     Gen: Comfortable-appearing but confused; alert and oriented to person, but not place or time. HEENT: Oropharynx clear without erythema or exudate.  Neck: No cervical LAD, no thyromegaly or nodules, no JVD noted.  CV: Normal rate, regular rhythm, no murmurs, rubs, or gallops Pulmonary: Normal effort, CTA bilaterally, no crackles or wheezes Abdominal: Soft, mild diffuse tenderness to palpation, non-distended obese, without rebound, guarding, or masses Extremities: Distal pulses 2+ in upper and lower extremities bilaterally, no tenderness, erythema. Large ulcerating mass 6x6 cm in dorsum of L forearm. Trace edema in bilateral lower extremities, R>L. Neuro: CN II-XII grossly intact, no focal weakness or sensory deficits noted. Patient hard of hearing and distractible during exam.  Lab Results: Basic Metabolic Panel:  Recent Labs Lab 04/15/15 2145  NA 143  K 3.7  CL 92*  CO2 39*  GLUCOSE 110*  BUN 13  CREATININE 1.06  CALCIUM 8.9   Liver Function Tests:  Recent Labs Lab 04/15/15 2145  AST 14*  ALT 10*  ALKPHOS 60  BILITOT 0.7  PROT 6.5  ALBUMIN 2.6*    Recent Labs Lab 04/15/15 2145  LIPASE 51    Recent Labs Lab 04/15/15 2145  AMMONIA 22   CBC:  Recent Labs Lab 04/15/15 2145  WBC 11.2*  NEUTROABS 8.9*  HGB 12.9*  HCT 42.4  MCV 91.0  PLT 213   Urinalysis:  Recent Labs Lab 04/15/15 2338    COLORURINE YELLOW  LABSPEC 1.012  PHURINE 6.0  GLUCOSEU NEGATIVE  HGBUR MODERATE*  BILIRUBINUR NEGATIVE  KETONESUR NEGATIVE  PROTEINUR NEGATIVE  NITRITE NEGATIVE  LEUKOCYTESUR LARGE*   Assessment/Plan: 1. Acute encephalopathy 2/2 hypercarbea - slowly improving; pt with underlying chronic OHS/OSA (no PFTs on file or sleep study), noncompliant with CPAP at home after being discharged with it 12/2014. Gradually increased oxygen at home from 1.5 L up to 3-6 L, likely worsening his metabolically-compensated chronic hypoxic hypercapnic respiratory failure and causing his increased confusion. Patient does have h/o CVA with residual weakness and 'idiopathic' peripheral neuropathy, so neuromuscular weakness may have a contributing role and, if so, would respond to BiPAP at home. For now, he has no overt s/s infection, however he does have a chronic Foley in place so treatment for UTI is justified if his confusion does worsen. The most important issue currently is ensuring he is not over-oxygenated which would likely worsen his CO2 and mental status. -Maintain pt O2 sats between 88-90% -Repeat 2-view CXR once on floor; initial CXR poor quality, no abx at this time -Re-check ABG tomorrow -Obtain MIP on Monday to e/f neuromuscular dz -On chronic benzos for 'anxiety', takes ~2 times per day PRN. Will try to slowly titrate off of benzos outpatient; started on citalopram here as more safe and effective alternative  2. Constipation - 'has not pooped in 3 weeks' and  may certainly be contributing to his increased confusion over recent weeks. Has tried miralax and mag citrate at home. Rectal exam shows no obvious impaction. -Fleet enemas -Senokot  3. Ulcerating mass of LUE - per wife, was told it was 'cancer but not the kind that is life-threatening' - likely SCCa in appearance -Follow-up with PCP, outpatient derm -Surgical consult not indicated at this time given above acute medical issues mentioned  above  Dispo: Disposition is deferred at this time, awaiting improvement of current medical problems.  Anticipated discharge in approximately 2-3 day(s).   The patient does have a current PCP Benjamin Pulling Arna Medici, MD) and does need an [facility] hospital follow-up appointment after discharge.  The patient does have transportation limitations that hinder transportation to clinic appointments.  LOS: 1 day   Benjamin Gable, MD 04/16/2015, 10:47 AM

## 2015-04-17 ENCOUNTER — Inpatient Hospital Stay (HOSPITAL_COMMUNITY): Payer: Medicare Other

## 2015-04-17 DIAGNOSIS — E874 Mixed disorder of acid-base balance: Secondary | ICD-10-CM

## 2015-04-17 DIAGNOSIS — E876 Hypokalemia: Secondary | ICD-10-CM

## 2015-04-17 LAB — BASIC METABOLIC PANEL
ANION GAP: 10 (ref 5–15)
BUN: 13 mg/dL (ref 6–20)
CALCIUM: 8.6 mg/dL — AB (ref 8.9–10.3)
CO2: 37 mmol/L — AB (ref 22–32)
CREATININE: 1.06 mg/dL (ref 0.61–1.24)
Chloride: 91 mmol/L — ABNORMAL LOW (ref 101–111)
GFR calc Af Amer: >60 mL/min (ref >60)
GLUCOSE: 115 mg/dL — AB (ref 65–99)
Potassium: 3.3 mmol/L — ABNORMAL LOW (ref 3.5–5.1)
Sodium: 138 mmol/L (ref 135–145)

## 2015-04-17 LAB — BLOOD GAS, ARTERIAL
ACID-BASE EXCESS: 14.9 mmol/L — AB (ref 0.0–2.0)
Bicarbonate: 40 mEq/L — ABNORMAL HIGH (ref 20.0–24.0)
DRAWN BY: 42624
O2 Content: 3 L/min
O2 SAT: 85.5 %
PATIENT TEMPERATURE: 98.6
PO2 ART: 53 mmHg — AB (ref 80.0–100.0)
TCO2: 41.8 mmol/L (ref 0–100)
pCO2 arterial: 59.6 mmHg (ref 35.0–45.0)
pH, Arterial: 7.442 (ref 7.350–7.450)

## 2015-04-17 LAB — OCCULT BLOOD X 1 CARD TO LAB, STOOL: FECAL OCCULT BLD: POSITIVE — AB

## 2015-04-17 MED ORDER — ENOXAPARIN SODIUM 80 MG/0.8ML ~~LOC~~ SOLN
0.5000 mg/kg | Freq: Every day | SUBCUTANEOUS | Status: DC
  Administered 2015-04-18 – 2015-04-21 (×4): 75 mg via SUBCUTANEOUS
  Filled 2015-04-17: qty 0.75
  Filled 2015-04-17: qty 0.8
  Filled 2015-04-17: qty 0.75
  Filled 2015-04-17: qty 0.8

## 2015-04-17 MED ORDER — POTASSIUM CHLORIDE CRYS ER 20 MEQ PO TBCR
40.0000 meq | EXTENDED_RELEASE_TABLET | Freq: Once | ORAL | Status: AC
  Administered 2015-04-17: 40 meq via ORAL
  Filled 2015-04-17: qty 2

## 2015-04-17 MED ORDER — SODIUM CHLORIDE 0.9 % IV SOLN
INTRAVENOUS | Status: AC
  Administered 2015-04-17 (×2): 100 mL/h via INTRAVENOUS

## 2015-04-17 NOTE — Progress Notes (Signed)
CRITICAL VALUE ALERT    Date of notification:  04/17/2015  Time of notification:  0352  Critical value read back:Yes.    Nurse who received alert:  Reynolds Bowl RN  MD notified (1st page):  Dr. Benjamine Mola  Time of first page:  0350  MD notified (2nd page):  Time of second page:  Responding MD:    Time MD responded:

## 2015-04-17 NOTE — Progress Notes (Signed)
PT Discharge Note  Patient Details Name: Benjamin Kaiser MRN: [id number] DOB: [birth year]   Cancelled Treatment:    Reason Eval/Treat Not Completed: Other (comment)  Had a long discussion with Mr. and Mrs. Gopal. He has essentially been bed bound for over a year now and does not tolerate their hoyer lift at home due to pain. Wife and son (primarily Mrs. Hemminger) perform total assist care for patient including rolling and daily hygienic care. She reports he received HHPT this past November but due to modest gains PT was discontinued. Unfortunately, we do not have much to offer from an acute physical therapy standpoint. I offered to discuss bed mobility assistance techniques for wife although she has been performing care for him for so long that she knows what he can and cannot tolerate due to pain with minimal mobility. I educated Mrs. Bowmen on the need for frequent pressure relief (at least every 2 hours) to prevent further pressure ulcers from developing. She reports that they may want to try HHPT again in the future so that he can walk again, but I believe, based on the information she has provided today, that this is an unrealistic expectation. No acute PT needs identified at this time as patient is at his functional baseline. PT is signing-off. Thank you for this referral.   Should we consider a palliative care consult to establish patient and family's expectations and goals of care?  Ellouise Newer 04/17/2015, 4:14 PM Elayne Snare, [place]

## 2015-04-17 NOTE — Progress Notes (Addendum)
Subjective: This morning, he was resting comfortably though noted to have several apneic episodes while I was in the room. O2 saturations were in the mid 90s on 1.5 L alone but dropped to the low 90s on 1 L alone.  Objective: Vital signs in last 24 hours: Filed Vitals:   04/16/15 2356 04/17/15 0000 04/17/15 0400 04/17/15 0500  BP:  110/63 103/65   Pulse:  64    Temp: 97.9 F (36.6 C)  98.4 F (36.9 C)   TempSrc: Oral  Axillary   Resp:  25    Height:      Weight:    330 lb 11 oz (150 kg)  SpO2:  94%      Gen: elderly, obese Caucasian male, asleep CV: Normal rate, regular rhythm, ?systolic murmur best appreciated in the L upper sternal border Pulmonary: Normal effort, CTA bilaterally, no crackles or wheezes Abdominal: Soft, non-distended obese, without rebound, guarding, or masses Extremities: Distal pulses 2+ in upper and lower extremities bilaterally, no tenderness, erythema. Large ulcerating mass 6x6 cm in dorsum of L forearm wrapped in bandage, Trace edema in bilateral lower extremities, R>L. Neuro: sleepy, somewhat arousable to sternal rub  Lab Results: Basic Metabolic Panel:  Recent Labs Lab 04/15/15 2145  NA 143  K 3.7  CL 92*  CO2 39*  GLUCOSE 110*  BUN 13  CREATININE 1.06  CALCIUM 8.9   Liver Function Tests:  Recent Labs Lab 04/15/15 2145  AST 14*  ALT 10*  ALKPHOS 60  BILITOT 0.7  PROT 6.5  ALBUMIN 2.6*    Recent Labs Lab 04/15/15 2145  LIPASE 51    Recent Labs Lab 04/15/15 2145  AMMONIA 22   CBC:  Recent Labs Lab 04/15/15 2145  WBC 11.2*  NEUTROABS 8.9*  HGB 12.9*  HCT 42.4  MCV 91.0  PLT 213   Urinalysis:  Recent Labs Lab 04/15/15 2338  COLORURINE YELLOW  LABSPEC 1.012  PHURINE 6.0  GLUCOSEU NEGATIVE  HGBUR MODERATE*  BILIRUBINUR NEGATIVE  KETONESUR NEGATIVE  PROTEINUR NEGATIVE  NITRITE NEGATIVE  LEUKOCYTESUR LARGE*   Assessment/Plan: 1. Acute encephalopathy 2/2 hypercarbea - Likely 2/2 OHS/OSA with reduced  ventilatory drive with increased oxygen at home and non-adherence to CPAP which resulted in worsening his metabolically-compensated chronic hypoxic hypercapnic respiratory failure and causing his increased confusion. Patient does have h/o CVA with residual weakness and 'idiopathic' peripheral neuropathy, so neuromuscular weakness may have a contributing role and, if so, would respond to BiPAP at home. ABG this morning with pH 7.442, CO2 59.6, O2 53.0, bicarbonate 37 [on BMET] which is consistent with mixed metabolic alkalosis and respiratory acidosis.  -Maintain patient O2 sats between 88-90% -Repeat 2-view CXR today -Give NS @ 100cc/hr x 8 hours as contraction alkalosis may be contributory to his acid-base disorder -Obtain MIP on Monday to e/f neuromuscular dz -Continue Celexa 20mg  with plan to transition off Xanax for anxiety which could further worsen his ventilatory effort -Follow-up urine culture [NGTD x 1 day] though would suspect UTI to be less likely in the absence of symptoms  2. Constipation - Bowel movements noted yesterday with FOBT+ but no diarrhea to suggest clinically significant GI bleed. Unclear why he is on dual antiplatelet therapy as PCI was in 2012.  -Continue Senokot-S 2 tablets daily -Discontinue ASA 81mg   3. Ulcerating mass of LUE - per wife, was told it was 'cancer but not the kind that is life-threatening' - likely SCCa in appearance -Consult Surgery pending improvement in other conditions noted  above  4. Intertrigo: Continue Nystatin power.  5. Stage 2 sacral decubitus ulcers: Follow-up WOCN recommendations.  6. Hypokalemia: K 3.3. Give Kdur 68mEq and follow BMET tomorrow.  Dispo: Disposition is deferred at this time, awaiting improvement of current medical problems.  Anticipated discharge in approximately 2-3 day(s).   The patient does have a current PCP [name], MD) and does need an [facility] hospital follow-up appointment after discharge.  The patient  does have transportation limitations that hinder transportation to clinic appointments.  LOS: 2 days   Riccardo Dubin, MD 04/17/2015, 7:29 AM

## 2015-04-17 NOTE — Progress Notes (Signed)
PT Cancellation Note  Patient Details Name: Benjamin Kaiser MRN: [id number] DOB: Aug 27, 1934   Cancelled Treatment:    Reason Eval/Treat Not Completed: Medical issues which prohibited therapy Patient remains on strict bed rest at this time. Resident on call paged. Will follow up when medically ready for PT evaluation.  Ellouise Newer 04/17/2015, 12:08 PM Elayne Snare, [place]

## 2015-04-17 NOTE — Progress Notes (Addendum)
Internal Medicine Attending  Date: 04/17/2015  Patient name: Benjamin Kaiser Medical record number: [id number] Date of birth: 01-Jan-1935 Age: 81 y.o. Gender: male  I saw and evaluated the patient. I reviewed the resident's note by Dr. Posey Pronto and I agree with the resident's findings and plans as documented in his progress note.  I saw Benjamin Kaiser on rounds earlier this morning. When I walked in the room he was on 1 L of oxygen per nasal cannula with an O2 sat of 93% (about an hour after Dr. Posey Pronto had lowerd the FIO2). He was much more awake and alert today and able to hold a conversation, much improved from yesterday. He was oriented to person but not to place and time. His only complaint was that his buttox was sore. His nurse informed me that he was doing well and he had 2 bowel movements.   An ABG this morning on an FiO2 of 3 L/min was 7.44/60/53. This is an improvement from the ABG on admission which was 7.38/75/73. The difference in the ABGs is likely related to a change in his hypoxic drive by lowering the FiO2 with a slight improvement in his ventilation and a 15 point lowering of his PaCO2. His chronic respiratory acidosis has improved before his renal function could compensate (18-24 hours later) for the improvement on lower oxygen therapy. Hence, we now have a metabolic alkalosis that has yet to adjust to the new level of a chronic respiratory acidosis on top of a likely contraction alkalosis as well.  What is disturbing is that we could only get the FIO2 down to 3 L/min overnight despite our attempts to educate on the importance of lowering the O2 to keep sats in the 88-90% range and no higher.  Clearly we failed in this regard.  I lowered his oxygen to room air and spoke with him for the next 5-10 minutes. He was in no respiratory distress and his O2 saturations ranged from 88% to 91%. Because I was concerned he would desaturate when asleep I placed him on half a liter of oxygen by nasal cannula per  minute. With this his O2 sat was 90% when I left the room.  I am hopeful that Benjamin Kaiser's mental status continues to improve as we carefully provide him the amount of oxygen he requires without inducing CO2 narcosis and confusion. If improved, it may be that window of opportunity to discuss goals of care with he and his wife as suggested by physical therapy. In the morning we will check a maximal inspiratory pressure to assess for respiratory muscle weakness to see if this may be playing a role in his hypoventilation. Ultimately, it is my believe that he will require BiPAP therapy at least at night to assist his hypoventilation. The real issue is if he will accept it and tolerate it as this has been a problem before. It may be prudent to try to titrate the BiPAP to a comfortable level while he is in the hospital and engaged in his care with the support of his wife if this is the direction they choose to pursue.  It seems that his bowels are now back in order to thanks to the work of the nursing staff and Drs. Posey Pronto and [employer]. It is now time to develop a chronic bowel regimen to prevent another episode that stimulated his wife to bring him to the hospital in the first place.

## 2015-04-18 DIAGNOSIS — K5900 Constipation, unspecified: Secondary | ICD-10-CM

## 2015-04-18 DIAGNOSIS — R410 Disorientation, unspecified: Secondary | ICD-10-CM

## 2015-04-18 DIAGNOSIS — B965 Pseudomonas (aeruginosa) (mallei) (pseudomallei) as the cause of diseases classified elsewhere: Secondary | ICD-10-CM

## 2015-04-18 DIAGNOSIS — N390 Urinary tract infection, site not specified: Secondary | ICD-10-CM

## 2015-04-18 LAB — BASIC METABOLIC PANEL
ANION GAP: 8 (ref 5–15)
BUN: 11 mg/dL (ref 6–20)
CALCIUM: 8.5 mg/dL — AB (ref 8.9–10.3)
CO2: 37 mmol/L — AB (ref 22–32)
CREATININE: 1.01 mg/dL (ref 0.61–1.24)
Chloride: 97 mmol/L — ABNORMAL LOW (ref 101–111)
GFR calc Af Amer: >60 mL/min (ref >60)
GLUCOSE: 110 mg/dL — AB (ref 65–99)
Potassium: 3.2 mmol/L — ABNORMAL LOW (ref 3.5–5.1)
Sodium: 142 mmol/L (ref 135–145)

## 2015-04-18 LAB — BLOOD GAS, ARTERIAL
Acid-Base Excess: 13.7 mmol/L — ABNORMAL HIGH (ref 0.0–2.0)
BICARBONATE: 38.7 meq/L — AB (ref 20.0–24.0)
Drawn by: 27407
O2 Content: 1 L/min
O2 Saturation: 87.2 %
PCO2 ART: 57.1 mmHg — AB (ref 35.0–45.0)
PH ART: 7.446 (ref 7.350–7.450)
PO2 ART: 56.3 mmHg — AB (ref 80.0–100.0)
Patient temperature: 98.6
TCO2: 40.4 mmol/L (ref 0–100)

## 2015-04-18 LAB — CBC
HCT: 36.6 % — ABNORMAL LOW (ref 39.0–52.0)
Hemoglobin: 11 g/dL — ABNORMAL LOW (ref 13.0–17.0)
MCH: 27 pg (ref 26.0–34.0)
MCHC: 30.1 g/dL (ref 30.0–36.0)
MCV: 89.9 fL (ref 78.0–100.0)
PLATELETS: 210 10*3/uL (ref 150–400)
RBC: 4.07 MIL/uL — ABNORMAL LOW (ref 4.22–5.81)
RDW: 16.2 % — AB (ref 11.5–15.5)
WBC: 8.5 10*3/uL (ref 4.0–10.5)

## 2015-04-18 MED ORDER — POTASSIUM CHLORIDE CRYS ER 20 MEQ PO TBCR
40.0000 meq | EXTENDED_RELEASE_TABLET | Freq: Two times a day (BID) | ORAL | Status: AC
  Administered 2015-04-18 (×2): 40 meq via ORAL
  Filled 2015-04-18 (×2): qty 2

## 2015-04-18 MED ORDER — COLLAGENASE 250 UNIT/GM EX OINT
TOPICAL_OINTMENT | Freq: Every day | CUTANEOUS | Status: DC
  Administered 2015-04-19 – 2015-04-21 (×3): via TOPICAL
  Filled 2015-04-18: qty 30

## 2015-04-18 MED ORDER — DEXTROSE 5 % IV SOLN
2.0000 g | Freq: Two times a day (BID) | INTRAVENOUS | Status: DC
  Administered 2015-04-18 – 2015-04-19 (×4): 2 g via INTRAVENOUS
  Filled 2015-04-18 (×6): qty 2

## 2015-04-18 NOTE — Clinical Documentation Improvement (Signed)
Internal Medicine  Can the diagnosis of acute encephalopathy be further specified?   Acute metabolic encephalopathy  Other  Clinically Undetermined  Document any associated diagnoses/conditions.   Supporting Information: 81 year old confused/altered mental status H&P Acute encephalopathy 2/2 chronic hypoxic hypercapnic respiratory failure  Likely in the setting of OHS/OSA and possible contribution from R hemidiaphragm paralysis. Patient was recently discharged in 12/2014 with Cpap but was non-compliant. CXR showing elevation of R hemidiaphragm and mild bibasilar atelectasis/ scarring. ABG done in the ED revealed pH 7.38, pCO2 75.3. PO2 73 (pCO2 was 59 on prior admission in November and 49 a few days prior to discharge). Bicarb on BMET elevated at 39. Results suggest a chronic respiratory acidosis with appropriate metabolic compensation. Echo findings and BMP on this admission do not suggest heart failure as the etiology of the patient's respiratory failure. He is compliant with his home Lasix making fluid overload less likely. CT head showing no acute intracranial process. Moderate to severe global brain atrophy, right frontoparietal encephalomalacia suggesting old infarct, and moderate chronic small vessel ischemic disease. His mental status improved after being placed on Bipap in the ED. Patient does have a chronic indwelling foley catheter with dirty UA but not convincing for ongoing infection at this time.  -Admit to stepdown -Continue on Bipap overnight. Repeat ABG in am to check respiratory status. -D/c IVF  -No antibiotics indicated at this time  -F/u urine culture  -PT eval  -Social work consult to obtain Bipap  2/18 progress note Assessment/Plan: 1. Acute encephalopathy 2/2 hypercarbea - slowly improving; pt with underlying chronic OHS/OSA (no PFTs on file or sleep study), noncompliant with CPAP at home after being discharged with it 12/2014. Gradually increased oxygen at  home from 1.5 L up to 3-6 L, likely worsening his metabolically-compensated chronic hypoxic hypercapnic respiratory failure and causing his increased confusion. Patient does have h/o CVA with residual weakness and 'idiopathic' peripheral neuropathy, so neuromuscular weakness may have a contributing role and, if so, would respond to BiPAP at home. For now, he has no overt s/s infection, however he does have a chronic Foley in place so treatment for UTI is justified if his confusion does worsen. The most important issue currently is ensuring he is not over-oxygenated which would likely worsen his CO2 and mental status. -Maintain pt O2 sats between 88-90% -Repeat 2-view CXR once on floor; initial CXR poor quality, no abx at this time -Re-check ABG tomorrow -Obtain MIP on Monday to e/f neuromuscular dz -On chronic benzos for 'anxiety', takes ~2 times per day PRN. Will try to slowly titrate off of benzos outpatient; started on citalopram here as more safe and effective alternative  2/19 progress note I am hopeful that Benjamin Kaiser's mental status continues to improve as we carefully provide him the amount of oxygen he requires without inducing CO2 narcosis and confusion.  Component     Latest Ref Rng 04/15/2015 04/17/2015  O2 Content       3.0  Delivery systems       NASAL CANNULA  pH, Arterial     7.350 - 7.450 7.383 7.442  pCO2 arterial     35.0 - 45.0 mmHg 75.3 (HH) 59.6 (HH)  pO2, Arterial     80.0 - 100.0 mmHg 73.0 (L) 53.0 (L)  Bicarbonate     20.0 - 24.0 mEq/L 45.1 (H) 40.0 (H)  TCO2     0 - 100 mmol/L 47 41.8  Acid-Base Excess     0.0 - 2.0 mmol/L  16.0 (H) 14.9 (H)  O2 Saturation      94.0 85.5  Patient temperature      97.7 F 98.6  Collection site      RADIAL, ALLEN'S TEST ACCEPTABLE RIGHT RADIAL  Drawn by      RT [phone]  Sample type      ARTERIAL ARTERIAL DRAW  Allens test (pass/fail)     PASS  PASS   Component     Latest Ref Rng 04/18/2015  O2 Content      1.0  Delivery  systems      NASAL CANNULA  pH, Arterial     7.350 - 7.450 7.446  pCO2 arterial     35.0 - 45.0 mmHg 57.1 (HH)  pO2, Arterial     80.0 - 100.0 mmHg 56.3 (L)  Bicarbonate     20.0 - 24.0 mEq/L 38.7 (H)  TCO2     0 - 100 mmol/L 40.4  Acid-Base Excess     0.0 - 2.0 mmol/L 13.7 (H)  O2 Saturation      87.2  Patient temperature      98.6  Collection site      RIGHT RADIAL  Drawn by      [id number]  Sample type      ARTERIAL DRAW  Allens test (pass/fail)     PASS PASS    Please exercise your independent, professional judgment when responding. A specific answer is not anticipated or expected.   Thank You,  [place] [phone]

## 2015-04-18 NOTE — Care Management Important Message (Signed)
Important Message  Patient Details  Name: Benjamin Kaiser MRN: [id number] Date of Birth: Jul 19, 1934   Medicare Important Message Given:  Yes    Loann Quill 04/18/2015, 10:48 AM

## 2015-04-18 NOTE — Progress Notes (Signed)
Subjective:  [name] had no acute events overnight. He is resting comfortably this morning. He is conversant but still confused and oriented to self only but not place or time. His only complaint was pain from his sacral wounds. O2 saturations were 90-93% on 0.5L.   Objective: Vital signs in last 24 hours: Filed Vitals:   04/18/15 0124 04/18/15 0400 04/18/15 0500 04/18/15 0800  BP:  120/58  159/144  Pulse: 65 58  54  Temp:  98.4 F (36.9 C)  97.5 F (36.4 C)  TempSrc:  Tympanic  Oral  Resp: 17 17  21   Height:      Weight:   332 lb 14.3 oz (151 kg)   SpO2: 92% 90%  92%   Gen: elderly, obese Caucasian male, asleep CV: Normal rate, regular rhythm, no murmurs, rubs, or gallops. Pulmonary: Normal effort, CTA bilaterally, no crackles or wheezes Abdominal: Soft, non-distended obese, without rebound, guarding, or masses Extremities: Distal pulses 2+ in upper and lower extremities bilaterally, no tenderness, erythema. Large ulcerating mass 6x6 cm in dorsum of L forearm wrapped in bandage, with edema distal to the wound. Trace edema in bilateral lower extremities, R>L. Neuro: awake and conversant but distractible and only oriented to self but not place or time  Lab Results: Basic Metabolic Panel:  Recent Labs Lab 04/17/15 0749 04/18/15 0218  NA 138 142  K 3.3* 3.2*  CL 91* 97*  CO2 37* 37*  GLUCOSE 115* 110*  BUN 13 11  CREATININE 1.06 1.01  CALCIUM 8.6* 8.5*   Liver Function Tests:  Recent Labs Lab 04/15/15 2145  AST 14*  ALT 10*  ALKPHOS 60  BILITOT 0.7  PROT 6.5  ALBUMIN 2.6*    Recent Labs Lab 04/15/15 2145  LIPASE 51    Recent Labs Lab 04/15/15 2145  AMMONIA 22   CBC:  Recent Labs Lab 04/15/15 2145 04/18/15 0218  WBC 11.2* 8.5  NEUTROABS 8.9*  --   HGB 12.9* 11.0*  HCT 42.4 36.6*  MCV 91.0 89.9  PLT 213 210   Urinalysis:  Recent Labs Lab 04/15/15 2338  COLORURINE YELLOW  LABSPEC 1.012  PHURINE 6.0  GLUCOSEU NEGATIVE  HGBUR  MODERATE*  BILIRUBINUR NEGATIVE  KETONESUR NEGATIVE  PROTEINUR NEGATIVE  NITRITE NEGATIVE  LEUKOCYTESUR LARGE*   Assessment/Plan: 1. Acute encephalopathy 2/2 hypercarbea - Likely 2/2 OHS/OSA with reduced ventilatory drive with increased oxygen at home and non-adherence to CPAP which resulted in worsening his metabolically-compensated chronic hypoxic hypercapnic respiratory failure and causing his increased confusion. Patient does have h/o CVA with residual weakness and 'idiopathic' peripheral neuropathy, so neuromuscular weakness may have a contributing role and, if so, would respond to BiPAP at home. ABG yesterday improved, now similar to older baseline ABGs. While he is improved from admission, his distractibility and confusion are concerning with delirium. It appears there is no previous history of underlying dementia, but I am concerned this may be contributing to his presentation as well. -Maintain patient O2 sats between 88-90% -Obtain MIP today to e/f neuromuscular dz; may need BiPAP at night -Continue Celexa 20mg ; d/c'ed Xanax today which may be making his respiratory issues worse -Follow-up urine culture [NGTD x 2 day] though would suspect UTI to be less likely in the absence of symptoms -Consult palliative care this morning for assistance with goals of care  2. Constipation - Bowel movements noted yesterday with FOBT+ but no diarrhea to suggest clinically significant GI bleed. Unclear why he is on dual antiplatelet therapy as PCI  was in 2012.  -Continue Senokot-S 2 tablets daily -Discontinued ASA 81mg  yesterday  3. Ulcerating mass of LUE - per wife, was told it was 'cancer but not the kind that is life-threatening' - likely SCCa in appearance -Consult Surgery pending improvement in other conditions noted above  4. Intertrigo: Continue Nystatin power.  5. Stage 2 sacral decubitus ulcers: Follow-up WOCN recommendations.  6. Hypokalemia: K 3.2 this morning -Kdur 75mEq x 2 this  morning  Dispo: Disposition is deferred at this time, awaiting improvement of current medical problems.  Anticipated discharge in approximately 2-3 day(s).   The patient does have a current PCP [name], MD) and does need an [facility] hospital follow-up appointment after discharge.  The patient does have transportation limitations that hinder transportation to clinic appointments.  LOS: 3 days   [name], MD 04/18/2015, 8:56 AM

## 2015-04-18 NOTE — Progress Notes (Signed)
Patient very very agitated with baseline confusion. Significant amount of RN time spent sitting with patient to redirect and distract patient from pulling off medical equipment. B/L mitens applied with no success. IMTS contacted for further advice and or orders.

## 2015-04-18 NOTE — Consult Note (Addendum)
WOC wound consult note Reason for Consult:  Consult requested for buttocks. Bedside nurse states pt also has a wound to left arm. Wound type: Left arm with a full thickness lesion; 4X4cm, reddish-white, atypical, raised above skin level. Friable tissue which could bleed easily when touched. This is NOT a wound; appearance is consistent with possible squamous cell growth. Topical treatment will not be effective to promote healing; recommend follow-up after discharge with a dermatologist for biopsy and removal; this service is not available as an inpatient. No family present to discuss plan of care.  Bilat buttocks with red macerated skin and patchy areas of partial thickness skin loss; appearance consistent with moisture associated skin damage.  Current dressing to buttocks has stool underneath and it will be difficult to keep wound from becoming soiled.   Left buttock with unstageable pressure injury to middle buttock; 4X1cm, 100% yellow, moist wound bed, no odor, small amt tan drainage. Left lower buttock has evolved into partial thickness wound; 3X1X.1cm, right buttock 2X2X.1cm; both sites pink and moist. Pressure Ulcer POA: Yes Dressing procedure/placement/frequency: Barrier cream to protect skin on buttocks and repel moisture.  Santyl ointment to chemically debride nonviable tissue to left buttock unstageable pressure injury. No family present to discuss plan of care.  Please re-consult if further assistance is needed.  Thank-you,  Julien Girt MSN, [place], [place], [place], [place]

## 2015-04-19 ENCOUNTER — Other Ambulatory Visit: Payer: Self-pay | Admitting: Cardiology

## 2015-04-19 DIAGNOSIS — B952 Enterococcus as the cause of diseases classified elsewhere: Secondary | ICD-10-CM

## 2015-04-19 DIAGNOSIS — N390 Urinary tract infection, site not specified: Secondary | ICD-10-CM | POA: Insufficient documentation

## 2015-04-19 LAB — BASIC METABOLIC PANEL
Anion gap: 8 (ref 5–15)
BUN: 10 mg/dL (ref 6–20)
CO2: 34 mmol/L — ABNORMAL HIGH (ref 22–32)
CREATININE: 1.02 mg/dL (ref 0.61–1.24)
Calcium: 8.7 mg/dL — ABNORMAL LOW (ref 8.9–10.3)
Chloride: 99 mmol/L — ABNORMAL LOW (ref 101–111)
GFR calc Af Amer: >60 mL/min (ref >60)
GLUCOSE: 106 mg/dL — AB (ref 65–99)
POTASSIUM: 3.6 mmol/L (ref 3.5–5.1)
SODIUM: 141 mmol/L (ref 135–145)

## 2015-04-19 LAB — MAGNESIUM: MAGNESIUM: 1.9 mg/dL (ref 1.7–2.4)

## 2015-04-19 MED ORDER — VANCOMYCIN HCL 10 G IV SOLR
2500.0000 mg | Freq: Once | INTRAVENOUS | Status: AC
  Administered 2015-04-19: 2500 mg via INTRAVENOUS
  Filled 2015-04-19: qty 2500

## 2015-04-19 MED ORDER — VANCOMYCIN HCL 10 G IV SOLR
1250.0000 mg | Freq: Two times a day (BID) | INTRAVENOUS | Status: DC
  Administered 2015-04-20: 1250 mg via INTRAVENOUS
  Filled 2015-04-19 (×2): qty 1250

## 2015-04-19 MED ORDER — ALPRAZOLAM 0.25 MG PO TABS
0.2500 mg | ORAL_TABLET | Freq: Once | ORAL | Status: AC
  Administered 2015-04-19: 0.25 mg via ORAL
  Filled 2015-04-19: qty 1

## 2015-04-19 MED ORDER — PRO-STAT SUGAR FREE PO LIQD
30.0000 mL | Freq: Two times a day (BID) | ORAL | Status: DC
  Administered 2015-04-19 – 2015-04-21 (×5): 30 mL via ORAL
  Filled 2015-04-19 (×5): qty 30

## 2015-04-19 NOTE — Progress Notes (Signed)
Palliative:   I have spoken with Mrs. Cassin this morning and she has doctor's appointments this afternoon but agrees to meet 2/22 1100 am to further discuss her husband's health. She shares that they have lost 3 sons since 2011 and this on top of his health issues has greatly changed his life as he used to be very active. His biggest request is that he wants to be at home "I was born in this house and I want to die in this house" but she is unsure how long she can continue caring for him with his needs. She also shares that he has always been against hospice care but she talks like she is open to this if this can provide help to them at home as well. Sounds like there has been some discussion and thoughts about his poor prognosis as I did not bring up hospice. Will speak further tomorrow.   Vinie Sill, NP Palliative Medicine Team Pager # [phone] (M-F 8a-5p) Team Phone # [phone] (Nights/Weekends)

## 2015-04-19 NOTE — Plan of Care (Signed)
Problem: Education: Goal: Knowledge of treatment and prevention of UTI/Pyleonephritis will improve Outcome: Not Progressing Variance: Physical/mental limitations     Problem: Urinary Elimination: Goal: Signs and symptoms of infection will decrease Outcome: Not Progressing Variance: Physical/mental limitations     Problem: Health Behavior/Discharge Planning: Goal: Ability to manage health-related needs will improve Outcome: Not Progressing Variance: Physical/mental limitations     Problem: Skin Integrity: Goal: Risk for impaired skin integrity will decrease Outcome: Not Progressing Variance: Physical/mental limitations

## 2015-04-19 NOTE — Progress Notes (Signed)
Subjective:  Benjamin Kaiser did have an episode of agitation overnight, confused and pulling his lines. He was given Xanax 0.25 to little effect, but when we saw him this morning he was comfortable, not agitated, but highly distractible.  Objective: Vital signs in last 24 hours: Filed Vitals:   04/19/15 0500 04/19/15 0600 04/19/15 0812 04/19/15 1200  BP:  150/92 160/76 131/97  Pulse:  61 58 59  Temp:   98.1 F (36.7 C) 98 F (36.7 C)  TempSrc:   Oral Oral  Resp:  15 20 19   Height:      Weight:      SpO2: 89% 90% 89% 91%   Gen: elderly, obese Caucasian male, asleep CV: Normal rate, regular rhythm, no murmurs, rubs, or gallops. Pulmonary: Normal effort, CTA bilaterally, no crackles or wheezes Abdominal: Soft, non-distended obese, without rebound, guarding, or masses Extremities: Distal pulses 2+ in upper and lower extremities bilaterally, no tenderness, erythema. Large ulcerating mass 6x6 cm in dorsum of L forearm wrapped in bandage, with edema distal to the wound. Trace edema in bilateral lower extremities, R>L. Neuro: awake and conversant but distractible and only oriented to self but not place or time  Lab Results: Basic Metabolic Panel:  Recent Labs Lab 04/18/15 0218 04/19/15 0351  NA 142 141  K 3.2* 3.6  CL 97* 99*  CO2 37* 34*  GLUCOSE 110* 106*  BUN 11 10  CREATININE 1.01 1.02  CALCIUM 8.5* 8.7*  MG  --  1.9   Liver Function Tests:  Recent Labs Lab 04/15/15 2145  AST 14*  ALT 10*  ALKPHOS 60  BILITOT 0.7  PROT 6.5  ALBUMIN 2.6*    Recent Labs Lab 04/15/15 2145  LIPASE 51    Recent Labs Lab 04/15/15 2145  AMMONIA 22   CBC:  Recent Labs Lab 04/15/15 2145 04/18/15 0218  WBC 11.2* 8.5  NEUTROABS 8.9*  --   HGB 12.9* 11.0*  HCT 42.4 36.6*  MCV 91.0 89.9  PLT 213 210   Urinalysis:  Recent Labs Lab 04/15/15 2338  COLORURINE YELLOW  LABSPEC 1.012  PHURINE 6.0  GLUCOSEU NEGATIVE  HGBUR MODERATE*  BILIRUBINUR NEGATIVE  KETONESUR  NEGATIVE  PROTEINUR NEGATIVE  NITRITE NEGATIVE  LEUKOCYTESUR LARGE*   Assessment/Plan: 1. Acute delirium - Multifactorial, likely due to UTI, hypercarbia, ABG improved, now similar to older baseline ABGs. While he is improved from admission, his distractibility and confusion are concerning with delirium. It appears there is no previously documented history of underlying dementia, but from discussions with his wife, he exhibits confusion and increased agitation/irritability at night for at least several months. -Maintain patient O2 sats between 88-90%; requires PAP at night to [facility] appropriate sats which should help prevent worsening confusion -Minimize distractions if at all possible. Will try to avoid benzos. If further agitation, use haloperidol or an atypical antipsychotic for pharmaceutical measures.  -Continue Celexa 20mg ; d/c'ed Xanax yesterday which may be making his respiratory issues worse -Family meeting tomorrow to discuss goals of care as patient wishes to be taken care of at home and family has increasing needs for his care; greatly appreciate the assistance of palliative care in helping coordinate this  2. Acute complicated cystitis 2/2 Pseudomonas - UA on admission with pyuria, bactiuria, with chronic Foley in place for BPH. UCx grew back Pseudomonas. This acute UTI is likely contributing to his acute change in mental status as well, alongside above mentioned factors. -Cefepime, day 2 out of 14 -Follow-up susceptibilities for oral abx  3. Constipation - Improved. Bowel movements noted yesterday with FOBT+ but no diarrhea to suggest clinically significant GI bleed. Unclear why he is on dual antiplatelet therapy as PCI was in 2012. FOBT+ likely from enema manipulation. -Continue Senokot-S 2 tablets daily -Discontinued ASA 81mg   4. Ulcerating mass of LUE - per wife, was told it was 'cancer but not the kind that is life-threatening' - likely SCCa in appearance -Consult Surgery  pending improvement in other conditions noted above  5. Intertrigo: Continue Nystatin power.  6. Stage 2 sacral decubitus ulcers: Follow-up WOCN recommendations.  Dispo: Disposition is deferred at this time, awaiting improvement of current medical problems.  Anticipated discharge in approximately 2-3 day(s).   The patient does have a current PCP [name], MD) and does need an [facility] hospital follow-up appointment after discharge.  The patient does have transportation limitations that hinder transportation to clinic appointments.  LOS: 4 days   [name], MD 04/19/2015, 1:50 PM

## 2015-04-19 NOTE — Consult Note (Signed)
WOC wound consult note Reason for Consult: new consult for buttocks and left axilla Actually my [place] partner saw this patient just yesterday for areas on the buttocks and new orders written Wound type: full thickness ulcer under the left axilla, unclear etiology Measurement:3cm x 2.5cm x 0.3cm  Wound bed:100% clean, pink, moist Drainage (amount, consistency, odor) no dressing in place not able to really assess drainage today.  Periwound: intact, dry skin Dressing procedure/placement/frequency: Add silicone foam dressing to protect and insulate for moist wound healing.  Noted patient is quite large (height and weight) with MASD noted yesterday and pressure injury will add bariatric low air loss mattress for moisture management and pressure redistribution.  Discussed POC with patient and bedside nurse.  Re consult if needed, will not follow at this time. Thanks  Loxley Cibrian Kellogg, [place] [phone])

## 2015-04-19 NOTE — Progress Notes (Addendum)
Initial Nutrition Assessment  DOCUMENTATION CODES:   Obesity unspecified  INTERVENTION:  -RD to continue to monitor for needs -Pro-Stat 71mL BID, each supplement provides 100 calories and 15g protein.   NUTRITION DIAGNOSIS:   Inadequate oral intake related to lethargy/confusion as evidenced by per patient/family report.  GOAL:   Patient will meet greater than or equal to 90% of their needs  MONITOR:   PO intake, Labs, Skin, I & O's  REASON FOR ASSESSMENT:   Low Braden    ASSESSMENT:   Patient is a 81 yo M with a PMHx of CAD, dCHF, HTN, HLD, morbid obesity, CVA, stage III CKD, seizure disorder, bradycardia, gout, DVT of RLE, bladder calculi, BPH s/p TURP, GERD, peripheral neuropathy presenting to the hospital for constipation and AMS.  Attempted to speak with pt but he is still suffering from some confusion. Per chart, PO intake 75-100% yesterday. No family at bedside during time of visit. AMSx3 PTA.  Nutrition-Focused physical exam completed. Findings are no fat depletion, no muscle depletion, and moderate edema.   Per chart, pt's weight is down from 368-322# over the course of 1 year. 12.5% weight loss is insignificant for that time span.  Pt has a mass at LUE. Per chart, wife was told it was benign. Unsure if this is related to pt's weight loss at this time.   Pt did not have a BM for 3 weeks PTA, did have one yesterday on Senokot-S.  Labs and medications reviewed.   Diet Order:  Diet Heart Room service appropriate?: Yes; Fluid consistency:: Thin  Skin:   Multiple Stg 2 Sacral decubitus ulcers.  Last BM:  2/20  Height:   Ht Readings from Last 1 Encounters:  04/16/15 6\' 2"  (1.88 m)    Weight:   Wt Readings from Last 1 Encounters:  04/19/15 322 lb 8.5 oz (146.3 kg)    Ideal Body Weight:  86.36 kg  BMI:  Body mass index is 41.39 kg/(m^2).  Estimated Nutritional Needs:   Kcal:  2100 - 2600 calories  Protein:  145-160 grams  Fluid:  >/=  2.1L  EDUCATION NEEDS:   No education needs identified at this time  Satira Anis. Kerstie Agent, MS, RD LDN After Hours/Weekend Pager [phone]

## 2015-04-19 NOTE — Progress Notes (Signed)
ANTIBIOTIC CONSULT NOTE - INITIAL  Pharmacy Consult for Vancomycin Indication: Enterococcus UTI   Allergies  Allergen Reactions  . Niacin And Related Other (See Comments)    Occasional flushing, itching    Patient Measurements: Height: 6\' 2"  (188 cm) Weight: (!) 322 lb 8.5 oz (146.3 kg) IBW/kg (Calculated) : 82.2   Vital Signs: Temp: 98.1 F (36.7 C) (02/21 0812) Temp Source: Oral (02/21 0812) BP: 160/76 mmHg (02/21 0812) Pulse Rate: 58 (02/21 0812) Intake/Output from previous day: 02/20 0701 - 02/21 0700 In: 1060 [P.O.:960; IV Piggyback:100] Out: 1050 [Urine:1050] Intake/Output from this shift: Total I/O In: 120 [P.O.:120] Out: 250 [Urine:250]  Labs:  Recent Labs  04/17/15 0749 04/18/15 0218 04/19/15 0351  WBC  --  8.5  --   HGB  --  11.0*  --   PLT  --  210  --   CREATININE 1.06 1.01 1.02   Estimated Creatinine Clearance: 91 mL/min (by C-G formula based on Cr of 1.02). No results for input(s): VANCOTROUGH, VANCOPEAK, VANCORANDOM, GENTTROUGH, GENTPEAK, GENTRANDOM, TOBRATROUGH, TOBRAPEAK, TOBRARND, AMIKACINPEAK, AMIKACINTROU, AMIKACIN in the last 72 hours.   Microbiology: Recent Results (from the past 720 hour(s))  Urine culture     Status: None (Preliminary result)   Collection Time: 04/15/15 11:39 PM  Result Value Ref Range Status   Specimen Description URINE, CATHETERIZED  Final   Special Requests NONE  Final   Culture   Final    >=100,000 COLONIES/mL PSEUDOMONAS AERUGINOSA >=100,000 COLONIES/mL ENTEROCOCCUS SPECIES SUSCEPTIBILITIES TO FOLLOW    Report Status PENDING  Incomplete  MRSA PCR Screening     Status: None   Collection Time: 04/16/15  2:27 PM  Result Value Ref Range Status   MRSA by PCR NEGATIVE NEGATIVE Final    Comment:        The GeneXpert MRSA Assay (FDA approved for NASAL specimens only), is one component of a comprehensive MRSA colonization surveillance program. It is not intended to diagnose MRSA infection nor to guide  or monitor treatment for MRSA infections.     Medical History: Past Medical History  Diagnosis Date  . Hypertension   . Seizure disorder ([place])   . Hyperlipidemia   . Bradycardia     secondary to Atenolol  . Gout   . Obesity   . DVT (deep venous thrombosis) ([place]) 2003    Right lower extremity  . Bladder calculi     status cystoscopy  . BPH (benign prostatic hypertrophy)     status post TURP  . History of stroke   . CAD (coronary artery disease)     a. NSTEMI (8/12). LHC with total occlusion LAD and collaterals from RCA, 90% serial CFX stenoses. Patient was evaluated for CABG but was turned down by CVTS given obesity and poor functional status. He had DES to CFX placed instead.   . Chronic diastolic heart failure ([place])     a. Echo (8/12) with EF 45-50%, anteroseptal hypokinesis, mild LV hypertrophy, normal RV, aortic sclerosis;  b. Echo (3/13) with EF 50-55%, mild AS.   Marland Kitchen Aortic stenosis     a. mild by echo 04/2011  . GERD (gastroesophageal reflux disease)   . Neuropathy ([place])   . Stroke ([place])   . Stage III chronic kidney disease 01/01/2015    Medications:  Prescriptions prior to admission  Medication Sig Dispense Refill Last Dose  . ALPRAZolam (XANAX) 0.25 MG tablet Take 0.25 mg by mouth at bedtime as needed for anxiety.    04/14/2015 at Unknown  time  . aspirin EC 81 MG tablet Take 81 mg by mouth daily.   04/15/2015 at Unknown time  . atorvastatin (LIPITOR) 80 MG tablet Take 1 tablet by mouth  daily 90 tablet 3 04/15/2015 at Unknown time  . clopidogrel (PLAVIX) 75 MG tablet Take 1 tablet by mouth  daily 90 tablet 3 04/15/2015 at Unknown time  . docusate sodium (COLACE) 100 MG capsule Take 100 mg by mouth 2 (two) times daily as needed for mild constipation.   Past Week at Unknown time  . furosemide (LASIX) 40 MG tablet Take 1 tablet by mouth  daily 90 tablet 3 04/15/2015 at Unknown time  . gabapentin (NEURONTIN) 300 MG capsule Take 2 capsules by mouth 3  times daily 540 capsule 0  04/15/2015 at am  . ipratropium-albuterol (DUONEB) 0.5-2.5 (3) MG/3ML SOLN Take 3 mLs by nebulization every 6 (six) hours as needed. (Patient taking differently: Take 3 mLs by nebulization every 6 (six) hours as needed (for SOB). ) 360 mL 2 Past Week at Unknown time  . isosorbide mononitrate (IMDUR) 30 MG 24 hr tablet Take 1 tablet by mouth  daily 30 tablet 2 04/15/2015 at Unknown time  . metoprolol succinate (TOPROL-XL) 50 MG 24 hr tablet Take 25 mg by mouth daily. Take with or immediately following a meal.   04/15/2015 at 1000  . Multiple Vitamins-Minerals (EYE VITAMINS) CAPS Take 1 capsule by mouth daily.     04/15/2015 at Unknown time  . neomycin-bacitracin-polymyxin (NEOSPORIN) ointment Apply 1 application topically as needed for wound care.   Past Month at Unknown time  . nitroGLYCERIN (NITROSTAT) 0.4 MG SL tablet Place 1 tablet (0.4 mg total) under the tongue every 5 (five) minutes as needed. 25 tablet prn not used  . OXYGEN Inhale 2-4 L into the lungs continuous.   04/15/2015 at Unknown time  . polyethylene glycol (MIRALAX / GLYCOLAX) packet Take 17 g by mouth 2 (two) times daily.   04/15/2015 at am  . Propylene Glycol (SYSTANE BALANCE) 0.6 % SOLN Place 1-2 drops into both eyes at bedtime as needed (for dry eyes).   04/14/2015 at Unknown time  . Tamsulosin HCl (FLOMAX) 0.4 MG CAPS Take 0.4 mg by mouth daily.   04/15/2015 at Unknown time  . Vitamins A & D (VITAMIN A & D) ointment Apply 1 application topically as needed for dry skin.   Past Week at Unknown time  . ZETIA 10 MG tablet Take 1 tablet by mouth  daily (Patient taking differently: Take 1 tablet by mouth  daily in evening) 90 tablet 3 04/14/2015 at Unknown time  . metoprolol succinate (TOPROL XL) 25 MG 24 hr tablet Take 1 tablet (25 mg total) by mouth daily. 30 tablet 2   . pantoprazole (PROTONIX) 40 MG tablet Take 1 tablet by mouth  daily 90 tablet 0 not started   Assessment: 70 YOM admitted with acute encephalopathy and reduced ventilatory  drive. UCx now growing > 100K pseudomonas and >100K enterococcus. Currently on IV cefepime and pharmacy consulted to start IV Vancomycin for complicated UTI. WBC wnl. Afebrile.   Goal of Therapy:  Vancomycin trough level 10-15 mcg/ml  Plan:  -Give Vancomycin 2500 mg IV once followed by IV Vancomycin 1250 mg IV Q 12 hours -Continue Cefepime 2 gm IV Q 12 hours -F/u UCx speciation  -VT as necessary   Albertina Parr, PharmD., BCPS Clinical Pharmacist Pager [phone]

## 2015-04-19 NOTE — Progress Notes (Signed)
IMTS assessed patient at bedside. New orders received for x1 of xanax. Xanax was very minimally effective. Patient did not sleep any this night. Patient continues to pull of medicaical equipment. Frequent safety checks maintained. Bed alarm on. Call bell kept with in reach. Oral fluids offered and accepted often. Patient kept clean and dry.

## 2015-04-20 DIAGNOSIS — F4489 Other dissociative and conversion disorders: Secondary | ICD-10-CM

## 2015-04-20 DIAGNOSIS — Z515 Encounter for palliative care: Secondary | ICD-10-CM

## 2015-04-20 LAB — URINE CULTURE

## 2015-04-20 MED ORDER — FOSFOMYCIN TROMETHAMINE 3 G PO PACK
3.0000 g | PACK | ORAL | Status: DC
  Administered 2015-04-20: 3 g via ORAL
  Filled 2015-04-20: qty 3

## 2015-04-20 MED ORDER — PIPERACILLIN-TAZOBACTAM 3.375 G IVPB
3.3750 g | Freq: Three times a day (TID) | INTRAVENOUS | Status: DC
  Administered 2015-04-20: 3.375 g via INTRAVENOUS
  Filled 2015-04-20 (×2): qty 50

## 2015-04-20 MED ORDER — FOSFOMYCIN TROMETHAMINE 3 G PO PACK: 3.0000 g | PACK | Freq: Once | ORAL | Status: DC

## 2015-04-20 MED ORDER — FOSFOMYCIN TROMETHAMINE 3 G PO PACK: 3.0000 g | PACK | ORAL | Status: DC

## 2015-04-20 NOTE — Progress Notes (Signed)
Subjective:  Benjamin Kaiser had no repeat episodes of agitation overnight. He tolerated CPAP well. When we saw him this morning he was comfortable, sleeping.  Objective: Vital signs in last 24 hours: Filed Vitals:   04/20/15 0200 04/20/15 0400 04/20/15 0409 04/20/15 0741  BP: 164/69 157/68  157/71  Pulse: 57 55  52  Temp:  98 F (36.7 C)  98.7 F (37.1 C)  TempSrc:  Axillary  Axillary  Resp: 19 17  19   Height:      Weight:   336 lb (152.409 kg)   SpO2: 90% 90%  90%   Gen: elderly, obese Caucasian male, asleep CV: Normal rate, regular rhythm, no murmurs, rubs, or gallops. Pulmonary: Normal effort, CTA bilaterally, no crackles or wheezes Abdominal: Soft, non-distended obese, without rebound, guarding, or masses Extremities: Distal pulses 2+ in upper and lower extremities bilaterally, no tenderness, erythema. Large ulcerating mass 6x6 cm in dorsum of L forearm wrapped in bandage, with edema distal to the wound. Trace edema in bilateral lower extremities, R>L. Neuro: awake and conversant but distractible and only oriented to self but not place or time  Lab Results: Basic Metabolic Panel:  Recent Labs Lab 04/18/15 0218 04/19/15 0351  NA 142 141  K 3.2* 3.6  CL 97* 99*  CO2 37* 34*  GLUCOSE 110* 106*  BUN 11 10  CREATININE 1.01 1.02  CALCIUM 8.5* 8.7*  MG  --  1.9   Liver Function Tests:  Recent Labs Lab 04/15/15 2145  AST 14*  ALT 10*  ALKPHOS 60  BILITOT 0.7  PROT 6.5  ALBUMIN 2.6*    Recent Labs Lab 04/15/15 2145  LIPASE 51    Recent Labs Lab 04/15/15 2145  AMMONIA 22   CBC:  Recent Labs Lab 04/15/15 2145 04/18/15 0218  WBC 11.2* 8.5  NEUTROABS 8.9*  --   HGB 12.9* 11.0*  HCT 42.4 36.6*  MCV 91.0 89.9  PLT 213 210   Urinalysis:  Recent Labs Lab 04/15/15 2338  COLORURINE YELLOW  LABSPEC 1.012  PHURINE 6.0  GLUCOSEU NEGATIVE  HGBUR MODERATE*  BILIRUBINUR NEGATIVE  KETONESUR NEGATIVE  PROTEINUR NEGATIVE  NITRITE NEGATIVE    LEUKOCYTESUR LARGE*   Assessment/Plan: 1. Acute delirium - Multifactorial, likely due to UTI, hypercarbia, ABG improved, now similar to older baseline ABGs. While he is improved from admission, his distractibility and confusion are concerning with delirium. It appears there is no previously documented history of underlying dementia, but from discussions with his wife, he exhibits confusion and increased agitation/irritability at night for at least several months. -Maintain patient O2 sats between 88-90%; requires PAP at night to [facility] appropriate sats which should help prevent worsening confusion -Minimize distractions if at all possible. Will try to avoid benzos. If further agitation, use haloperidol or an atypical antipsychotic for pharmaceutical measures.  -Continue Celexa 20mg ; d/c'ed Xanax -Family meeting today to discuss goals of care as patient wishes to be taken care of at home and family has increasing needs for his care; greatly appreciate the assistance of palliative care in helping coordinate this  2. Acute complicated cystitis 2/2 Pseudomonas and Enterococcus - UA on admission with pyuria, bactiuria, with chronic Foley in place for BPH. UCx grew back Pseudomonas. This acute UTI is likely contributing to his acute change in mental status as well, alongside above mentioned factors. -Cefepime, day 3 out of 14; added on Vancomycin on 2/21 -Follow-up susceptibilities for further abx regimen  3. Constipation - Improved. Bowel movements noted yesterday with FOBT+  but no diarrhea to suggest clinically significant GI bleed. Unclear why he is on dual antiplatelet therapy as PCI was in 2012. FOBT+ likely from enema manipulation. -Continue Senokot-S 2 tablets daily -Discontinued ASA 81mg   4. Ulcerating mass of LUE - per wife, was told it was 'cancer but not the kind that is life-threatening' - likely SCCa in appearance -Consult Surgery pending improvement in other conditions noted above  5.  Intertrigo: Continue Nystatin power.  6. Stage 2 sacral decubitus ulcers: Follow-up WOCN recommendations.  Dispo: Disposition is deferred at this time, awaiting improvement of current medical problems.  Anticipated discharge in approximately 2-3 day(s).   The patient does have a current PCP [name], MD) and does need an [facility] hospital follow-up appointment after discharge.  The patient does have transportation limitations that hinder transportation to clinic appointments.  LOS: 5 days   [name], MD 04/20/2015, 9:04 AM

## 2015-04-20 NOTE — Consult Note (Signed)
Consultation Note Date: 04/20/2015   Patient Name: Benjamin Kaiser  DOB: [birth year]  MRN: [id number]  Age / Sex: 81 y.o., male  PCP: Leonard Downing, MD Referring Physician: Axel Filler, MD  Reason for Consultation: Establishing goals of care    Clinical Assessment/Narrative: I met today with Benjamin Kaiser (who is very lethargic and unable to participate), Benjamin Kaiser, and their son. They confirm that he has been bed-bound for ~1 year now and they have noticed more confusion over the past few months. Benjamin Kaiser says that she cared for her mother with Alzheimer's and recognizes similar signs of dementia in caring for her husband. We discussed overall decline and failure to thrive as well as his current medical state and diagnoses. Dr. Posey Pronto with Internal Medicine and Anderson Malta, medical student, were present and assisted with conversation - appreciate their input.   They would like to respect their father's wishes to keep him at home and not in a "nursing home" for as long as possible although this is becoming difficult. We discussed more how hospice can help them at home and they would like to try this. They will need maximum resources to assist them. We did discuss potential use of hospice facility in the future as well as hospice social workers that can assist them with options if home is no longer an option. Their goal is to minimize his suffering.   Contacts/Participants in Discussion: Primary Decision Maker: Wife    SUMMARY OF RECOMMENDATIONS - DNR confirmed - Home with hospice likely tomorrow (requesting bigger bed for home - air mattress if possible) - Minimize medications - Minimize suffering and keep him home as long as possible  Code Status/Advance Care Planning: DNR    Code Status Orders        Start     Ordered   04/20/15 1200  Do not attempt resuscitation (DNR)   Continuous    Question  Answer Comment  In the event of cardiac or respiratory ARREST Do not call a "code blue"   In the event of cardiac or respiratory ARREST Do not perform Intubation, CPR, defibrillation or ACLS   In the event of cardiac or respiratory ARREST Use medication by any route, position, wound care, and other measures to relive pain and suffering. May use oxygen, suction and manual treatment of airway obstruction as needed for comfort.      04/20/15 1159    Code Status History    Date Active Date Inactive Code Status Order ID Comments User Context   04/16/2015  5:03 AM 04/20/2015 11:59 AM Full Code [id number]  Bethena Roys, MD ED   12/30/2014  9:34 PM 01/07/2015  6:11 PM Full Code [id number]  Reubin Milan, MD Inpatient    Advance Directive Documentation        Most Recent Value   Type of Advance Directive  Living will   Pre-existing out of facility DNR order (yellow form or pink MOST form)     "MOST" Form in Place?        Symptom Management:   Sundowning/agitation: Recommend low dose risperidone 0.5 mg qhs.   Palliative Prophylaxis:   Aspiration, Bowel Regimen and Oral Care  Additional Recommendations (Limitations, Scope, Preferences):  Avoid Hospitalization and Minimize Medications  Psycho-social/Spiritual:  Support System: Strong Desire for further Chaplaincy support:no Additional Recommendations: Caregiving  Support/Resources and Education on Hospice  Prognosis: < 6 months with worsening likely dementia (bedbound) and obesity hypoventilation syndrome (noncompliant with CPAP).  Discharge Planning: Home with Home Health   Chief Complaint/ Primary Diagnoses: Present on Admission:  . Hypercapnic respiratory failure, chronic ([place]) . CAD (coronary artery disease) . Pressure ulcer . Unable to ambulate . (Resolved) Respiratory failure ([place]) . Obesity hypoventilation syndrome ([place]) . Confusion state . Constipation . Complicated UTI (urinary tract infection)  I have  reviewed the medical record, interviewed the patient and family, and examined the patient. The following aspects are pertinent.  Past Medical History  Diagnosis Date  . Hypertension   . Seizure disorder ([place])   . Hyperlipidemia   . Bradycardia     secondary to Atenolol  . Gout   . Obesity   . DVT (deep venous thrombosis) ([place]) 2003    Right lower extremity  . Bladder calculi     status cystoscopy  . BPH (benign prostatic hypertrophy)     status post TURP  . History of stroke   . CAD (coronary artery disease)     a. NSTEMI (8/12). LHC with total occlusion LAD and collaterals from RCA, 90% serial CFX stenoses. Patient was evaluated for CABG but was turned down by CVTS given obesity and poor functional status. He had DES to CFX placed instead.   . Chronic diastolic heart failure ([place])     a. Echo (8/12) with EF 45-50%, anteroseptal hypokinesis, mild LV hypertrophy, normal RV, aortic sclerosis;  b. Echo (3/13) with EF 50-55%, mild AS.   Marland Kitchen Aortic stenosis     a. mild by echo 04/2011  . GERD (gastroesophageal reflux disease)   . Neuropathy ([place])   . Stroke ([place])   . Stage III chronic kidney disease 01/01/2015   Social History   Social History  . Marital Status: Married    Spouse Name: Barbaraann Share   . Number of Children: 4  . Years of Education: 31   Social History Main Topics  . Smoking status: Never Smoker   . Smokeless tobacco: Former Systems developer    Types: [place] date: 02/26/1989  . Alcohol Use: No     Comment: Pt reports he use to drink alcohol  . Drug Use: No  . Sexual Activity: Not Asked   Other Topics Concern  . None   Social History Narrative   Patient is married and has four sons, Only one still living.  Lives in [place]   Patient is right handed.   Patient has high school degree   Patient drinks 4 cups of caffeine daily   Family History  Problem Relation Age of Onset  . Lung cancer Father   . Suicidality Son   . Stroke Son   . Heart attack Son   . Heart attack  Son   . Fainting Brother    Scheduled Meds: . antiseptic oral rinse  7 mL Mouth Rinse BID  . atorvastatin  80 mg Oral q1800  . clopidogrel  75 mg Oral Daily  . collagenase   Topical Daily  . enoxaparin (LOVENOX) injection  0.5 mg/kg Subcutaneous Daily  . ezetimibe  10 mg Oral Daily  . feeding supplement (PRO-STAT SUGAR FREE 64)  30 mL Oral BID  . [START ON 04/21/2015] fosfomycin  3 g Oral Once per day on Mon Wed Fri  . furosemide  40 mg Oral Daily  . gabapentin  600 mg Oral TID  . isosorbide mononitrate  30 mg Oral Daily  . metoprolol succinate  25 mg Oral Daily  . nystatin   Topical BID  . senna-docusate  2 tablet Oral BID   Continuous Infusions:  PRN Meds:.acetaminophen **OR** acetaminophen, nitroGLYCERIN, sodium phosphate Medications Prior to Admission:  Prior to Admission medications   Medication Sig Start Date End Date Taking? Authorizing Provider  ALPRAZolam Duanne Moron) 0.25 MG tablet Take 0.25 mg by mouth at bedtime as needed for anxiety.    Yes Historical Provider, MD  aspirin EC 81 MG tablet Take 81 mg by mouth daily.   Yes Historical Provider, MD  atorvastatin (LIPITOR) 80 MG tablet Take 1 tablet by mouth  daily 03/07/15  Yes Larey Dresser, MD  clopidogrel (PLAVIX) 75 MG tablet Take 1 tablet by mouth  daily 08/17/14  Yes Larey Dresser, MD  docusate sodium (COLACE) 100 MG capsule Take 100 mg by mouth 2 (two) times daily as needed for mild constipation.   Yes Historical Provider, MD  furosemide (LASIX) 40 MG tablet Take 1 tablet by mouth  daily 08/17/14  Yes Larey Dresser, MD  gabapentin (NEURONTIN) 300 MG capsule Take 2 capsules by mouth 3  times daily 03/05/15  Yes Garvin Fila, MD  ipratropium-albuterol (DUONEB) 0.5-2.5 (3) MG/3ML SOLN Take 3 mLs by nebulization every 6 (six) hours as needed. Patient taking differently: Take 3 mLs by nebulization every 6 (six) hours as needed (for SOB).  01/07/15  Yes Hosie Poisson, MD  metoprolol succinate (TOPROL-XL) 50 MG 24 hr tablet Take  25 mg by mouth daily. Take with or immediately following a meal.   Yes Historical Provider, MD  Multiple Vitamins-Minerals (EYE VITAMINS) CAPS Take 1 capsule by mouth daily.     Yes Historical Provider, MD  neomycin-bacitracin-polymyxin (NEOSPORIN) ointment Apply 1 application topically as needed for wound care.   Yes Historical Provider, MD  nitroGLYCERIN (NITROSTAT) 0.4 MG SL tablet Place 1 tablet (0.4 mg total) under the tongue every 5 (five) minutes as needed. 10/02/13  Yes Larey Dresser, MD  OXYGEN Inhale 2-4 L into the lungs continuous.   Yes Historical Provider, MD  polyethylene glycol (MIRALAX / GLYCOLAX) packet Take 17 g by mouth 2 (two) times daily.   Yes Historical Provider, MD  Propylene Glycol (SYSTANE BALANCE) 0.6 % SOLN Place 1-2 drops into both eyes at bedtime as needed (for dry eyes).   Yes Historical Provider, MD  Tamsulosin HCl (FLOMAX) 0.4 MG CAPS Take 0.4 mg by mouth daily.   Yes Historical Provider, MD  Vitamins A & D (VITAMIN A & D) ointment Apply 1 application topically as needed for dry skin.   Yes Historical Provider, MD  ZETIA 10 MG tablet Take 1 tablet by mouth  daily Patient taking differently: Take 1 tablet by mouth  daily in evening 08/17/14  Yes Larey Dresser, MD  isosorbide mononitrate (IMDUR) 30 MG 24 hr tablet Take 1 tablet by mouth  daily 04/19/15   Larey Dresser, MD  metoprolol succinate (TOPROL XL) 25 MG 24 hr tablet Take 1 tablet (25 mg total) by mouth daily. 03/01/15   Larey Dresser, MD  pantoprazole (PROTONIX) 40 MG tablet Take 1 tablet by mouth  daily 02/11/15   Larey Dresser, MD   Allergies  Allergen Reactions  . Niacin And Related Other (See Comments)    Occasional flushing, itching    Review of Systems  Unable to perform ROS   Physical Exam  Constitutional: He appears well-developed and well-nourished. He appears lethargic.  HENT:  Head: Normocephalic and atraumatic.  Cardiovascular: Bradycardia present.   Respiratory: Effort normal. No  accessory muscle usage. No  tachypnea. No respiratory distress.  GI: Normal appearance.  Neurological: He appears lethargic.    Vital Signs: BP 156/70 mmHg  Pulse 50  Temp(Src) 98.7 F (37.1 C) (Axillary)  Resp 15  Ht _0  (1.88 m)  Wt 152.409 kg (336 lb)  BMI 43.12 kg/m2  SpO2 93%  SpO2: SpO2: 93 % O2 Device:SpO2: 93 % O2 Flow Rate: .O2 Flow Rate (L/min): 6 L/min  IO: Intake/output summary:  Intake/Output Summary (Last 24 hours) at 04/20/15 1200 Last data filed at 04/20/15 0500  Gross per 24 hour  Intake    850 ml  Output    625 ml  Net    225 ml    LBM: Last BM Date: 04/19/15 Baseline Weight: Weight: (!) 151.3 kg (333 lb 8.9 oz) Most recent weight: Weight: (!) 152.409 kg (336 lb)      Palliative Assessment/Data:  Flowsheet Rows        Most Recent Value   Intake Tab    Referral Department  -- [internal medicine]   Unit at Time of Referral  Intermediate Care Unit   Palliative Care Primary Diagnosis  Pulmonary   Date Notified  04/18/15   Palliative Care Type  New Palliative care   Reason for referral  Clarify Goals of Care   Date of Admission  04/15/15   # of days IP prior to Palliative referral  3   Clinical Assessment    Psychosocial & Spiritual Assessment    Palliative Care Outcomes       Additional Data Reviewed:  CBC:    Component Value Date/Time   WBC 8.5 04/18/2015 0218   HGB 11.0* 04/18/2015 0218   HCT 36.6* 04/18/2015 0218   PLT 210 04/18/2015 0218   MCV 89.9 04/18/2015 0218   NEUTROABS 8.9* 04/15/2015 2145   LYMPHSABS 1.2 04/15/2015 2145   MONOABS 0.6 04/15/2015 2145   EOSABS 0.5 04/15/2015 2145   BASOSABS 0.0 04/15/2015 2145   Comprehensive Metabolic Panel:    Component Value Date/Time   NA 141 04/19/2015 0351   K 3.6 04/19/2015 0351   CL 99* 04/19/2015 0351   CO2 34* 04/19/2015 0351   BUN 10 04/19/2015 0351   CREATININE 1.02 04/19/2015 0351   CREATININE 1.49* 10/02/2013 1610   GLUCOSE 106* 04/19/2015 0351   CALCIUM 8.7*  04/19/2015 0351   AST 14* 04/15/2015 2145   ALT 10* 04/15/2015 2145   ALKPHOS 60 04/15/2015 2145   BILITOT 0.7 04/15/2015 2145   PROT 6.5 04/15/2015 2145   ALBUMIN 2.6* 04/15/2015 2145     Time In: 1050 Time Out: 1210 Time Total: 93mn Greater than 50%  of this time was spent counseling and coordinating care related to the above assessment and plan.  Signed by: PPershing Proud NP  APershing Proud NP  29/70/2637 12:00 PM  Please contact Palliative Medicine Team phone at [phone] questions and concerns.

## 2015-04-20 NOTE — Progress Notes (Signed)
ANTIBIOTIC CONSULT NOTE - INITIAL  Pharmacy Consult:  Zosyn Indication:  Complicated Pseudomonal and Enterococcal UTI  Allergies  Allergen Reactions  . Niacin And Related Other (See Comments)    Occasional flushing, itching    Patient Measurements: Height: 6\' 2"  (188 cm) Weight: (!) 336 lb (152.409 kg) IBW/kg (Calculated) : 82.2  Vital Signs: Temp: 98.7 F (37.1 C) (02/22 0741) Temp Source: Axillary (02/22 0741) BP: 157/71 mmHg (02/22 0741) Pulse Rate: 52 (02/22 0741) Intake/Output from previous day: 02/21 0701 - 02/22 0700 In: 970 [P.O.:120; IV Piggyback:850] Out: [id number] [Urine:875]  Labs:  Recent Labs  04/18/15 0218 04/19/15 0351  WBC 8.5  --   HGB 11.0*  --   PLT 210  --   CREATININE 1.01 1.02   Estimated Creatinine Clearance: 93.1 mL/min (by C-G formula based on Cr of 1.02). No results for input(s): VANCOTROUGH, VANCOPEAK, VANCORANDOM, GENTTROUGH, GENTPEAK, GENTRANDOM, TOBRATROUGH, TOBRAPEAK, TOBRARND, AMIKACINPEAK, AMIKACINTROU, AMIKACIN in the last 72 hours.   Microbiology: Recent Results (from the past 720 hour(s))  Urine culture     Status: None   Collection Time: 04/15/15 11:39 PM  Result Value Ref Range Status   Specimen Description URINE, CATHETERIZED  Final   Special Requests NONE  Final   Culture   Final    >=100,000 COLONIES/mL PSEUDOMONAS AERUGINOSA >=100,000 COLONIES/mL ENTEROCOCCUS SPECIES    Report Status 04/20/2015 FINAL  Final   Organism ID, Bacteria PSEUDOMONAS AERUGINOSA  Final   Organism ID, Bacteria ENTEROCOCCUS SPECIES  Final      Susceptibility   Pseudomonas aeruginosa - MIC*    CEFTAZIDIME <=1 SENSITIVE Sensitive     CIPROFLOXACIN 2 INTERMEDIATE Intermediate     GENTAMICIN <=1 SENSITIVE Sensitive     IMIPENEM 8 INTERMEDIATE Intermediate     PIP/TAZO <=4 SENSITIVE Sensitive     CEFEPIME <=1 SENSITIVE Sensitive     * >=100,000 COLONIES/mL PSEUDOMONAS AERUGINOSA   Enterococcus species - MIC*    AMPICILLIN <=2 SENSITIVE Sensitive      LEVOFLOXACIN >=8 RESISTANT Resistant     NITROFURANTOIN <=16 SENSITIVE Sensitive     VANCOMYCIN 1 SENSITIVE Sensitive     * >=100,000 COLONIES/mL ENTEROCOCCUS SPECIES  MRSA PCR Screening     Status: None   Collection Time: 04/16/15  2:27 PM  Result Value Ref Range Status   MRSA by PCR NEGATIVE NEGATIVE Final    Comment:        The GeneXpert MRSA Assay (FDA approved for NASAL specimens only), is one component of a comprehensive MRSA colonization surveillance program. It is not intended to diagnose MRSA infection nor to guide or monitor treatment for MRSA infections.     Medical History: Past Medical History  Diagnosis Date  . Hypertension   . Seizure disorder ([place])   . Hyperlipidemia   . Bradycardia     secondary to Atenolol  . Gout   . Obesity   . DVT (deep venous thrombosis) ([place]) 2003    Right lower extremity  . Bladder calculi     status cystoscopy  . BPH (benign prostatic hypertrophy)     status post TURP  . History of stroke   . CAD (coronary artery disease)     a. NSTEMI (8/12). LHC with total occlusion LAD and collaterals from RCA, 90% serial CFX stenoses. Patient was evaluated for CABG but was turned down by CVTS given obesity and poor functional status. He had DES to CFX placed instead.   . Chronic diastolic heart failure ([place])  a. Echo (8/12) with EF 45-50%, anteroseptal hypokinesis, mild LV hypertrophy, normal RV, aortic sclerosis;  b. Echo (3/13) with EF 50-55%, mild AS.   Marland Kitchen Aortic stenosis     a. mild by echo 04/2011  . GERD (gastroesophageal reflux disease)   . Neuropathy ([place])   . Stroke ([place])   . Stage III chronic kidney disease 01/01/2015     Assessment: 54 YOM with complicated UTI started on vancomycin and cefepime yesterday.  Culture result and sensitivity return and patient to switch to Zosyn monotherapy.  Patient's renal function has been stable.  Cefepime 2/20 >> 2/22 Vanc 2/21 >> 2/22 Zosyn 2/22 >>  2/17 UCx - 100K Pseudomonas and   >100K Enterococcus   Goal of Therapy:  Resolution of infection   Plan:  - Zosyn 3.375gm IV Q8H, 4 hr infusion - Pharmacy will sign off as dosage adjustment is likely unnecessary.  Thank you for the consult! - ID pharmacist recommended fostomycin 3gm PO Q48H x 3 doses outpatient   Sherrell Farish D. Mina Marble, PharmD, BCPS Pager:  [phone] 04/20/2015, 10:07 AM

## 2015-04-20 NOTE — Care Management Note (Signed)
Case Management Note  Patient Details  Name: Benjamin Kaiser MRN: [id number] Date of Birth: 1935/01/12  Subjective/Objective:   Pt lives with spouse and will discharge with home hospice.  Provided list of [facility] agencies and made referral to [facility] per choice, documentation faxed.                            Expected Discharge Plan:  Home w Hospice Care  Discharge planning Services  CM Consult  Post Acute Care Choice:  Hospice Choice offered to:  Spouse  HH Arranged:  RN, Nurse's Aide, Social Work [employer] Agency:  Other - See comment  Status of Service:  In process, will continue to follow  Medicare Important Message Given:  Yes  Girard Cooter, RN 04/20/2015, 1:03 PM

## 2015-04-20 NOTE — Progress Notes (Signed)
Pt has not been agitated/restless since 2300.  CPAP has been on since then, pt has not had any issues. Pt is more lethargic than the start of shift. VS's WNL. RN will continue to monitor.

## 2015-04-21 MED ORDER — FOSFOMYCIN TROMETHAMINE 3 G PO PACK: 3.0000 g | PACK | ORAL | Status: DC

## 2015-04-21 MED ORDER — RISPERIDONE 0.5 MG PO TBDP: 0.5000 mg | ORAL_TABLET | Freq: Every day | ORAL | Status: DC

## 2015-04-21 MED ORDER — RISPERIDONE 0.5 MG PO TABS: 0.5000 mg | ORAL_TABLET | Freq: Every day | ORAL | Status: DC

## 2015-04-21 MED ORDER — COLLAGENASE 250 UNIT/GM EX OINT: TOPICAL_OINTMENT | Freq: Every day | CUTANEOUS | Status: AC

## 2015-04-21 MED ORDER — NYSTATIN 100000 UNIT/GM EX POWD: CUTANEOUS | Status: AC

## 2015-04-21 MED ORDER — RISPERIDONE 0.5 MG PO TBDP
0.5000 mg | ORAL_TABLET | Freq: Once | ORAL | Status: AC
  Administered 2015-04-21: 0.5 mg via ORAL
  Filled 2015-04-21: qty 1

## 2015-04-21 MED ORDER — FOSFOMYCIN TROMETHAMINE 3 G PO PACK: 3.0000 g | PACK | ORAL | Status: AC

## 2015-04-21 MED ORDER — RISPERIDONE 0.5 MG PO TBDP: 0.5000 mg | ORAL_TABLET | Freq: Every day | ORAL | Status: AC

## 2015-04-21 MED ORDER — HALOPERIDOL LACTATE 5 MG/ML IJ SOLN
0.5000 mg | Freq: Once | INTRAMUSCULAR | Status: AC
  Administered 2015-04-21: 0.5 mg via INTRAMUSCULAR
  Filled 2015-04-21: qty 1

## 2015-04-21 MED ORDER — SENNOSIDES-DOCUSATE SODIUM 8.6-50 MG PO TABS: 2.0000 | ORAL_TABLET | Freq: Two times a day (BID) | ORAL | Status: AC

## 2015-04-21 MED ORDER — RISPERIDONE 0.5 MG PO TABS: 0.5000 mg | ORAL_TABLET | Freq: Once | ORAL | Status: DC

## 2015-04-21 MED ORDER — COLLAGENASE 250 UNIT/GM EX OINT: TOPICAL_OINTMENT | Freq: Every day | CUTANEOUS | Status: DC

## 2015-04-21 NOTE — Discharge Summary (Signed)
Name: Benjamin Kaiser MRN: [id number] DOB: Mar 12, 1934 81 y.o. PCP: Leonard Downing, MD  Date of Admission: 04/15/2015  7:39 PM Date of Discharge: 04/21/2015 Attending Physician: Axel Filler, MD  Discharge Diagnosis: 1. Acute encephalopathy due to hypercarbia and UTI 2. Complicated UTI due to Pseudomonas and Enterococcus  Principal Problem:   Complicated UTI (urinary tract infection) Active Problems:   CAD (coronary artery disease)   Pressure ulcer   Hypercapnic respiratory failure, chronic (HCC)   Unable to ambulate   Chronic indwelling Foley catheter   Obesity hypoventilation syndrome (HCC)   Confusion state   Constipation   Urinary tract infectious disease   Palliative care encounter  Discharge Medications:   Medication List    ASK your doctor about these medications        ALPRAZolam 0.25 MG tablet  Commonly known as:  XANAX  Take 0.25 mg by mouth at bedtime as needed for anxiety.     aspirin EC 81 MG tablet  Take 81 mg by mouth daily.     atorvastatin 80 MG tablet  Commonly known as:  LIPITOR  Take 1 tablet by mouth  daily     clopidogrel 75 MG tablet  Commonly known as:  PLAVIX  Take 1 tablet by mouth  daily     docusate sodium 100 MG capsule  Commonly known as:  COLACE  Take 100 mg by mouth 2 (two) times daily as needed for mild constipation.     EYE VITAMINS Caps  Take 1 capsule by mouth daily.     furosemide 40 MG tablet  Commonly known as:  LASIX  Take 1 tablet by mouth  daily     gabapentin 300 MG capsule  Commonly known as:  NEURONTIN  Take 2 capsules by mouth 3  times daily     ipratropium-albuterol 0.5-2.5 (3) MG/3ML Soln  Commonly known as:  DUONEB  Take 3 mLs by nebulization every 6 (six) hours as needed.     isosorbide mononitrate 30 MG 24 hr tablet  Commonly known as:  IMDUR  Take 1 tablet by mouth  daily     metoprolol succinate 50 MG 24 hr tablet  Commonly known as:  TOPROL-XL  Take 25 mg by mouth daily. Take with  or immediately following a meal.     metoprolol succinate 25 MG 24 hr tablet  Commonly known as:  TOPROL XL  Take 1 tablet (25 mg total) by mouth daily.     neomycin-bacitracin-polymyxin ointment  Commonly known as:  NEOSPORIN  Apply 1 application topically as needed for wound care.     nitroGLYCERIN 0.4 MG SL tablet  Commonly known as:  NITROSTAT  Place 1 tablet (0.4 mg total) under the tongue every 5 (five) minutes as needed.     OXYGEN  Inhale 2-4 L into the lungs continuous.     pantoprazole 40 MG tablet  Commonly known as:  PROTONIX  Take 1 tablet by mouth  daily     polyethylene glycol packet  Commonly known as:  MIRALAX / GLYCOLAX  Take 17 g by mouth 2 (two) times daily.     SYSTANE BALANCE 0.6 % Soln  Generic drug:  Propylene Glycol  Place 1-2 drops into both eyes at bedtime as needed (for dry eyes).     tamsulosin 0.4 MG Caps capsule  Commonly known as:  FLOMAX  Take 0.4 mg by mouth daily.     vitamin A & D ointment  Apply 1  application topically as needed for dry skin.     ZETIA 10 MG tablet  Generic drug:  ezetimibe  Take 1 tablet by mouth  daily        Disposition and follow-up:   Benjamin Kaiser was discharged from [facility] in Stable condition.  At the hospital follow up visit please address:  1.  Continuing Fosfamycin for UTI, comfort care (focus on respiratory symptoms and bowel function), keep O2 sats between 88-92%, agitation (particularly at night)  2.  Labs / imaging needed at time of follow-up: None  3.  Pending labs/ test needing follow-up: None  Consultations: Treatment Team:  Palliative [facility]  Procedures Performed:  Dg Chest 1 View  04/17/2015  CLINICAL DATA:  Hypoxia. EXAM: CHEST 1 VIEW COMPARISON:  Yesterday. FINDINGS: Poor inspiration. Stable enlarged cardiac silhouette. Mildly prominent interstitial markings. Normal vascularity. No pleural fluid. No acute bony abnormality. IMPRESSION: No acute abnormality.  Mild cardiomegaly and mild chronic interstitial lung disease. Electronically Signed   By: Claudie Revering M.D.   On: 04/17/2015 11:24   Dg Chest 2 View  04/16/2015  CLINICAL DATA:  Respiratory failure.  Hypoxia.  Hypercapnia. EXAM: CHEST  2 VIEW COMPARISON:  12/30/2014. FINDINGS: The current examination is limited bowel motion blurring and patient rotation to the right. Grossly stable enlarged cardiac silhouette. Grossly clear lungs. Thoracic spine degenerative changes. IMPRESSION: Limited examination with grossly stable cardiomegaly and no gross acute abnormality. Electronically Signed   By: Claudie Revering M.D.   On: 04/16/2015 12:42   Ct Head Wo Contrast  04/15/2015  CLINICAL DATA:  Altered mental status, extreme confusion. History of hypertension, seizure disorder, stroke. EXAM: CT HEAD WITHOUT CONTRAST TECHNIQUE: Contiguous axial images were obtained from the base of the skull through the vertex without intravenous contrast. COMPARISON:  CT head December 30, 2014 FINDINGS: Moderate to severe ventriculomegaly on the basis of global parenchymal brain volume loss. Small area RIGHT frontoparietal encephalomalacia. No intraparenchymal hemorrhage, mass effect nor midline shift. Patchy supratentorial white matter hypodensities are within normal range for patient's age and though non-specific suggest sequelae of chronic small vessel ischemic disease. No acute large vascular territory infarcts. Scattered dural calcifications. No abnormal extra-axial fluid collections. Basal cisterns are patent. Moderate to severe calcific atherosclerosis of the carotid siphons and included vertebral arteries. No skull fracture. The included ocular globes and orbital contents are non-suspicious. Status post RIGHT ocular lens implant. Mild to moderate paranasal sinus mucosal thickening, old frontal sinus craniotomy. Moderate RIGHT temporomandibular osteoarthrosis of possible condylar fracture. IMPRESSION: No acute intracranial process.  Moderate to severe global brain atrophy. RIGHT frontoparietal encephalomalacia suggesting old infarct. Moderate chronic small vessel ischemic disease. Electronically Signed   By: Elon Alas M.D.   On: 04/15/2015 22:37   Dg Abd Acute W/chest  04/15/2015  CLINICAL DATA:  Lower abdominal pain. Constipation with no bowel movement for the past month. EXAM: DG ABDOMEN ACUTE W/ 1V CHEST COMPARISON:  Chest radiographs dated 12/30/2014 and chest CT dated 12/30/2014. FINDINGS: Poor inspiration. No gross change in enlargement of the cardiac silhouette. Interval elevation of the right hemidiaphragm. Interval mild linear density at both lung bases. Thoracic spine degenerative changes. Normal bowel gas pattern without free peritoneal air. Normal amount of stool. Small caliber tubing overlying the inferior pelvis in the midline. IMPRESSION: 1. Poor inspiration with interval elevation of the right hemidiaphragm and mild bibasilar atelectasis or scarring. 2. Stable cardiomegaly. 3. No acute abdominal abnormality. 4. Normal amount of stool. Electronically Signed  By: Claudie Revering M.D.   On: 04/15/2015 21:24   Admission HPI: Patient is a 81 yo M with a PMHx of CAD, dCHF, HTN, HLD, morbid obesity, CVA, stage III CKD, seizure disorder, bradycardia, gout, DVT of RLE, bladder calculi, BPH s/p TURP, GERD, peripheral neuropathy presenting to the hospital for constipation and AMS. He was on Bipap making it difficult for him to speak, as such, history was provided by wife at bedside. Wife states patient has been forgetful and confused for the past 2 weeks. States he had a UTI a few months ago and was treated at that time. Patient does have an indwelling foley catheter which is chronic. Foley was placed for convenience because patient is not able to move due to his large body habitus. Catheter is changed once a month, last changed in the beginning of this month. Patient's wife denied overdose of any medications or alcohol use. Not  on any opioids. No recent falls. States he had seizure activity several years ago, now not on medication anymore. No recent seizure activity. No fevers, headaches, photophobia, or neck stiffness. Previous admission in 12/2014 for similar presentation which was thought to be due to acute on chronic resp failure 2/2 CO2 narcosis. Patient was discharged with CPAP which he did not tolerate and did not use. He is currently on 2L home O2 via [place].   Wife also saying patient has failed to have a bowel movement in 3 weeks despite eating normally. Miralax and Magnesium citrate have been tried but to no avail. Today she noticed streaks of fecal matter on his bedsheet but no BM yet. Patient has been complaining of lower abdominal pain. No nausea, vomiting, or blood per rectum. No opioid use. No past abdominal surgeries.   Wife called EMS because patient did not have a bowel movement in 3 weeks. EMS found the patient to be hypoxic (O2 saturation 85% on 4L home oxygen). O2 saturation improved to 94% on 6L. In the ED, patient's blood work was done, imaging studies CT head and abd/CXR done, and he was placed on Bipap.   Hospital Course by problem list: Principal Problem:   Complicated UTI (urinary tract infection) Active Problems:   CAD (coronary artery disease)   Pressure ulcer   Hypercapnic respiratory failure, chronic (HCC)   Unable to ambulate   Chronic indwelling Foley catheter   Obesity hypoventilation syndrome (HCC)   Confusion state   Constipation   Urinary tract infectious disease   Palliative care encounter   1. Acute encephalopathy - patient came in with increased somnolence in the setting of OSA/OHS, underlying dementia, h/o of CVA and complicated UTI. ABG showed acute hypercarbia superimposed on his baseline chronic hypoxic hypercarbic respiratory failure, which was thought to be precipitated by increasing his O2 at home from 1.5L to as high as 6L as well as CPAP noncompliance. His UTI was treated  and his O2 sats were well kept between 88-92%. Long-term Xanax use was also thought to be contributory to his agitation and hypoxia, so they were slowly tapered off and he was placed on celexa for anxiety with antipsychotics. The family, who is taking care of him at home, identified his desire to continue his care at home despite increasing demands given his needs, so palliative care was involved in coordinating home hospice for the patient.  2. Complicated UTI due to Enterococcus and Pseudomonas - with a chronic foley in place for BPH and his gradual decline in mental status leading to his hospitalization,  the patient was known to be at high risk of developing a complicated UTI. Urine cultures grew Pseudomonas and vancomycin-sensitive enterococcus. He did not exhibit any signs of sepsis, with normal vitals and no leukocytosis or fevers. His old Foley was exchanged with a new Foley, and he was initially started on cefepime and vancomycin until sensitivities returned. Given fluoroquinoline resistance in this case, fosfamycin was the most appropriate oral antibiotic for him, and this was confirmed with the pharmacists. He received his first dose on 2/22, and will need 2 more doses, first on 2/24 and then again two days later on 2/26.  Discharge Vitals:   BP 169/89 mmHg  Pulse 75  Temp(Src) 98.3 F (36.8 C) (Oral)  Resp 19  Ht 6\' 2"  (1.88 m)  Wt 344 lb (156.037 kg)  BMI 44.15 kg/m2  SpO2 81%  Discharge Labs:  No results found for this or any previous visit (from the past 24 hour(s)).  Signed: [name], MD 04/21/2015, 9:54 AM

## 2015-04-21 NOTE — Progress Notes (Signed)
04/21/2015 patient temperature was 100.5 orally, notify internal medicine teaching service and they were made aware. Benjamin Kaiser

## 2015-04-21 NOTE — Progress Notes (Signed)
04/21/2015 Rn went over discharge sheet with  Wife at bedside and she was told what medication patient receive and what she needs to give patient when she gets home. [facility] RN.

## 2015-04-21 NOTE — Progress Notes (Signed)
Patient did not tolerated CPAP. RN place patient back on nasal cannula

## 2015-04-21 NOTE — Progress Notes (Signed)
Foley was discontinued at 1300. Patient tolerated the procedure. Output was 700 ml, urine clear, yellow in color.

## 2015-04-21 NOTE — Progress Notes (Signed)
Subjective:  [name] did not tolerate CPAP as well last night. He was given 0.5mg  haldol overnight for agitation. When we saw him this morning he was comfortable, sleeping.  Objective: Vital signs in last 24 hours: Filed Vitals:   04/21/15 0300 04/21/15 0316 04/21/15 0400 04/21/15 0500  BP: 112/80  134/81   Pulse: 65  63 63  Temp: 98.2 F (36.8 C)     TempSrc: Oral     Resp: 25  17 21   Height:      Weight:   344 lb (156.037 kg)   SpO2: 96% 94% 94% 93%   Gen: elderly, obese Caucasian male, asleep CV: Normal rate, regular rhythm, no murmurs, rubs, or gallops. Pulmonary: Normal effort, CTA bilaterally, no crackles or wheezes Abdominal: Soft, non-distended obese, without rebound, guarding, or masses Extremities: Distal pulses 2+ in upper and lower extremities bilaterally, no tenderness, erythema. Large ulcerating mass 6x6 cm in dorsum of L forearm wrapped in bandage, with edema distal to the wound. Trace edema in bilateral lower extremities, R>L. Neuro: awake and conversant but distractible and only oriented to self but not place or time  Lab Results: Basic Metabolic Panel:  Recent Labs Lab 04/18/15 0218 04/19/15 0351  NA 142 141  K 3.2* 3.6  CL 97* 99*  CO2 37* 34*  GLUCOSE 110* 106*  BUN 11 10  CREATININE 1.01 1.02  CALCIUM 8.5* 8.7*  MG  --  1.9   Liver Function Tests:  Recent Labs Lab 04/15/15 2145  AST 14*  ALT 10*  ALKPHOS 60  BILITOT 0.7  PROT 6.5  ALBUMIN 2.6*    Recent Labs Lab 04/15/15 2145  LIPASE 51    Recent Labs Lab 04/15/15 2145  AMMONIA 22   CBC:  Recent Labs Lab 04/15/15 2145 04/18/15 0218  WBC 11.2* 8.5  NEUTROABS 8.9*  --   HGB 12.9* 11.0*  HCT 42.4 36.6*  MCV 91.0 89.9  PLT 213 210   Urinalysis:  Recent Labs Lab 04/15/15 2338  COLORURINE YELLOW  LABSPEC 1.012  PHURINE 6.0  GLUCOSEU NEGATIVE  HGBUR MODERATE*  BILIRUBINUR NEGATIVE  KETONESUR NEGATIVE  PROTEINUR NEGATIVE  NITRITE NEGATIVE  LEUKOCYTESUR  LARGE*   Assessment/Plan: 1. Acute delirium - Multifactorial, likely due to UTI, hypercarbia, ABG improved, now similar to older baseline ABGs. While he is improved from admission, his distractibility and confusion are concerning with delirium. It appears there is no previously documented history of underlying dementia, but from discussions with his wife, he exhibits confusion and increased agitation/irritability at night for at least several months. -Maintain patient O2 sats between 88-90%; requires PAP at night to [facility] appropriate sats which should help prevent worsening confusion -Minimize distractions if at all possible. Will try to avoid benzos. If further agitation, use haloperidol or an atypical antipsychotic for pharmaceutical measures.  -Continue Celexa 20mg ; d/c'ed Xanax -Family meeting yesterday to discuss goals of care - family opts for home hospice which is congruent with patient's wishes although he  greatly appreciate the assistance of palliative care in helping coordinate this -Minimizing unnecessary meds (statin, ezetimibe, etc)  2. Acute complicated cystitis 2/2 Pseudomonas and Enterococcus - UA on admission with pyuria, bactiuria, with chronic Foley in place for BPH. UCx grew back Pseudomonas. This acute UTI is likely contributing to his acute change in mental status as well, alongside above mentioned factors. -S/p cefepime and vancomycin ---> zosyn x 1 ---> transitioned to oral fosfomycin 3mg  every other day x 3 doses. Appreciate pharmacy's assistance with  optimizing antibiotic plan  3. Constipation - Improved. Bowel movements noted yesterday with FOBT+ but no diarrhea to suggest clinically significant GI bleed. Unclear why he is on dual antiplatelet therapy as PCI was in 2012. FOBT+ likely from enema manipulation. -Continue Senokot-S 2 tablets daily -Discontinued ASA 81mg   4. 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 Intertrigo: Continue Nystatin power.  6. Stage 2 sacral decubitus ulcers: Follow-up [facility] recommendations.  Dispo: Disposition is deferred at this time, awaiting improvement of current medical problems.  Anticipated discharge in approximately 2-3 day(s).   The patient does have a current PCP [name], MD) and does need an [facility] hospital follow-up appointment after discharge.  The patient does have transportation limitations that hinder transportation to clinic appointments.  LOS: 6 days   [name], MD 04/21/2015, 7:08 AM

## 2015-04-21 NOTE — Progress Notes (Signed)
New 16 fr. foley was inserted at 1345, out flow was a bit bloody, but clear urine was observed in the tube. Patient tolerated the procedure well.Marland Kitchen

## 2015-04-21 NOTE — Progress Notes (Addendum)
Pt refusing CPAP for tonight. Explained to patient importance and was willing to try. Patient tolerating well at this time.

## 2015-05-02 ENCOUNTER — Other Ambulatory Visit: Payer: Self-pay | Admitting: Cardiology

## 2015-06-20 ENCOUNTER — Other Ambulatory Visit: Payer: Self-pay | Admitting: Cardiology

## 2015-07-11 ENCOUNTER — Other Ambulatory Visit: Payer: Self-pay | Admitting: Cardiology

## 2015-07-11 NOTE — Telephone Encounter (Signed)
REFILL 

## 2015-08-01 ENCOUNTER — Other Ambulatory Visit: Payer: Self-pay | Admitting: Cardiology

## 2015-08-02 NOTE — Telephone Encounter (Signed)
ASK your doctor about these medications   This note was on the discharge summary for plavix so I wasn't sure if the patient should still be on this medication. Please advise.

## 2015-08-02 NOTE — Telephone Encounter (Signed)
He should continue plavix. He is overdue for an office visit.

## 2015-08-08 NOTE — Telephone Encounter (Signed)
Dr Aundra Dubin said he should continue Plavix.  He is overdue for an office visit.

## 2015-08-20 ENCOUNTER — Encounter (HOSPITAL_COMMUNITY): Payer: Self-pay | Admitting: Emergency Medicine

## 2015-08-20 ENCOUNTER — Emergency Department (HOSPITAL_COMMUNITY): Payer: Medicare Other

## 2015-08-20 ENCOUNTER — Inpatient Hospital Stay (HOSPITAL_COMMUNITY)
Admission: EM | Admit: 2015-08-20 | Discharge: 2015-08-25 | DRG: 698 | Disposition: A | Discharge status: Hospice | Payer: Medicare Other | Attending: Internal Medicine | Admitting: Internal Medicine

## 2015-08-20 DIAGNOSIS — R4182 Altered mental status, unspecified: Secondary | ICD-10-CM

## 2015-08-20 DIAGNOSIS — T83518A Infection and inflammatory reaction due to other urinary catheter, initial encounter: Principal | ICD-10-CM | POA: Diagnosis present

## 2015-08-20 DIAGNOSIS — E873 Alkalosis: Secondary | ICD-10-CM

## 2015-08-20 DIAGNOSIS — I69354 Hemiplegia and hemiparesis following cerebral infarction affecting left non-dominant side: Secondary | ICD-10-CM

## 2015-08-20 DIAGNOSIS — Z955 Presence of coronary angioplasty implant and graft: Secondary | ICD-10-CM

## 2015-08-20 DIAGNOSIS — N400 Enlarged prostate without lower urinary tract symptoms: Secondary | ICD-10-CM | POA: Diagnosis present

## 2015-08-20 DIAGNOSIS — G629 Polyneuropathy, unspecified: Secondary | ICD-10-CM | POA: Diagnosis present

## 2015-08-20 DIAGNOSIS — E876 Hypokalemia: Secondary | ICD-10-CM | POA: Diagnosis present

## 2015-08-20 DIAGNOSIS — N183 Chronic kidney disease, stage 3 (moderate): Secondary | ICD-10-CM | POA: Diagnosis present

## 2015-08-20 DIAGNOSIS — G40909 Epilepsy, unspecified, not intractable, without status epilepticus: Secondary | ICD-10-CM | POA: Diagnosis present

## 2015-08-20 DIAGNOSIS — M109 Gout, unspecified: Secondary | ICD-10-CM | POA: Diagnosis present

## 2015-08-20 DIAGNOSIS — E785 Hyperlipidemia, unspecified: Secondary | ICD-10-CM | POA: Diagnosis present

## 2015-08-20 DIAGNOSIS — K219 Gastro-esophageal reflux disease without esophagitis: Secondary | ICD-10-CM | POA: Diagnosis present

## 2015-08-20 DIAGNOSIS — C44609 Unspecified malignant neoplasm of skin of left upper limb, including shoulder: Secondary | ICD-10-CM | POA: Diagnosis present

## 2015-08-20 DIAGNOSIS — Z87891 Personal history of nicotine dependence: Secondary | ICD-10-CM

## 2015-08-20 DIAGNOSIS — N401 Enlarged prostate with lower urinary tract symptoms: Secondary | ICD-10-CM | POA: Diagnosis present

## 2015-08-20 DIAGNOSIS — N390 Urinary tract infection, site not specified: Secondary | ICD-10-CM | POA: Diagnosis present

## 2015-08-20 DIAGNOSIS — Z7951 Long term (current) use of inhaled steroids: Secondary | ICD-10-CM

## 2015-08-20 DIAGNOSIS — I130 Hypertensive heart and chronic kidney disease with heart failure and stage 1 through stage 4 chronic kidney disease, or unspecified chronic kidney disease: Secondary | ICD-10-CM | POA: Diagnosis present

## 2015-08-20 DIAGNOSIS — R532 Functional quadriplegia: Secondary | ICD-10-CM | POA: Diagnosis present

## 2015-08-20 DIAGNOSIS — Z9981 Dependence on supplemental oxygen: Secondary | ICD-10-CM

## 2015-08-20 DIAGNOSIS — G9340 Encephalopathy, unspecified: Secondary | ICD-10-CM | POA: Diagnosis present

## 2015-08-20 DIAGNOSIS — I350 Nonrheumatic aortic (valve) stenosis: Secondary | ICD-10-CM | POA: Diagnosis present

## 2015-08-20 DIAGNOSIS — E878 Other disorders of electrolyte and fluid balance, not elsewhere classified: Secondary | ICD-10-CM | POA: Diagnosis present

## 2015-08-20 DIAGNOSIS — Z66 Do not resuscitate: Secondary | ICD-10-CM | POA: Diagnosis present

## 2015-08-20 DIAGNOSIS — I5032 Chronic diastolic (congestive) heart failure: Secondary | ICD-10-CM | POA: Diagnosis present

## 2015-08-20 DIAGNOSIS — I10 Essential (primary) hypertension: Secondary | ICD-10-CM | POA: Diagnosis present

## 2015-08-20 DIAGNOSIS — Z960 Presence of urogenital implants: Secondary | ICD-10-CM

## 2015-08-20 DIAGNOSIS — Z978 Presence of other specified devices: Secondary | ICD-10-CM

## 2015-08-20 DIAGNOSIS — Z515 Encounter for palliative care: Secondary | ICD-10-CM | POA: Diagnosis present

## 2015-08-20 DIAGNOSIS — Y846 Urinary catheterization as the cause of abnormal reaction of the patient, or of later complication, without mention of misadventure at the time of the procedure: Secondary | ICD-10-CM | POA: Diagnosis present

## 2015-08-20 DIAGNOSIS — I440 Atrioventricular block, first degree: Secondary | ICD-10-CM | POA: Diagnosis present

## 2015-08-20 DIAGNOSIS — Z6841 Body Mass Index (BMI) 40.0 and over, adult: Secondary | ICD-10-CM

## 2015-08-20 DIAGNOSIS — I4510 Unspecified right bundle-branch block: Secondary | ICD-10-CM | POA: Diagnosis present

## 2015-08-20 DIAGNOSIS — Z7189 Other specified counseling: Secondary | ICD-10-CM | POA: Insufficient documentation

## 2015-08-20 DIAGNOSIS — I2510 Atherosclerotic heart disease of native coronary artery without angina pectoris: Secondary | ICD-10-CM | POA: Diagnosis present

## 2015-08-20 DIAGNOSIS — R2230 Localized swelling, mass and lump, unspecified upper limb: Secondary | ICD-10-CM | POA: Diagnosis present

## 2015-08-20 DIAGNOSIS — Z7401 Bed confinement status: Secondary | ICD-10-CM

## 2015-08-20 DIAGNOSIS — F0390 Unspecified dementia without behavioral disturbance: Secondary | ICD-10-CM | POA: Diagnosis present

## 2015-08-20 DIAGNOSIS — R319 Hematuria, unspecified: Secondary | ICD-10-CM

## 2015-08-20 DIAGNOSIS — Z7902 Long term (current) use of antithrombotics/antiplatelets: Secondary | ICD-10-CM

## 2015-08-20 DIAGNOSIS — R627 Adult failure to thrive: Secondary | ICD-10-CM | POA: Diagnosis present

## 2015-08-20 DIAGNOSIS — I252 Old myocardial infarction: Secondary | ICD-10-CM

## 2015-08-20 DIAGNOSIS — Z79899 Other long term (current) drug therapy: Secondary | ICD-10-CM

## 2015-08-20 MED ORDER — SODIUM CHLORIDE 0.9 % IV SOLN
INTRAVENOUS | Status: DC
  Administered 2015-08-21: 03:00:00 via INTRAVENOUS

## 2015-08-20 NOTE — ED Notes (Signed)
Per [facility]  Pt has a previous CVA that his left sided deficits. Over the last 2 weeks pt has had increasing ALOC. Pt does have a foley.   Per wife 2 weeks ago his urine started looking different. Wife to sample to Drs office and said that he had in infection that would have to be treated with injections. Thursday the doctor came and gave him a shot. Wife took another specimen on Friday New catheter was supposed to be put in the 17th but it was not replaced until this past Friday.  "2 UTI"  Pt has been bed ridden since October.

## 2015-08-21 ENCOUNTER — Other Ambulatory Visit (HOSPITAL_COMMUNITY): Payer: Self-pay

## 2015-08-21 ENCOUNTER — Other Ambulatory Visit: Payer: Self-pay

## 2015-08-21 ENCOUNTER — Encounter (HOSPITAL_COMMUNITY): Payer: Self-pay | Admitting: Family Medicine

## 2015-08-21 DIAGNOSIS — N390 Urinary tract infection, site not specified: Secondary | ICD-10-CM

## 2015-08-21 DIAGNOSIS — R4182 Altered mental status, unspecified: Secondary | ICD-10-CM | POA: Diagnosis present

## 2015-08-21 DIAGNOSIS — I350 Nonrheumatic aortic (valve) stenosis: Secondary | ICD-10-CM | POA: Diagnosis present

## 2015-08-21 DIAGNOSIS — I2510 Atherosclerotic heart disease of native coronary artery without angina pectoris: Secondary | ICD-10-CM

## 2015-08-21 DIAGNOSIS — N183 Chronic kidney disease, stage 3 (moderate): Secondary | ICD-10-CM | POA: Diagnosis present

## 2015-08-21 DIAGNOSIS — G9340 Encephalopathy, unspecified: Secondary | ICD-10-CM

## 2015-08-21 DIAGNOSIS — G40909 Epilepsy, unspecified, not intractable, without status epilepticus: Secondary | ICD-10-CM | POA: Diagnosis present

## 2015-08-21 DIAGNOSIS — F0390 Unspecified dementia without behavioral disturbance: Secondary | ICD-10-CM | POA: Diagnosis present

## 2015-08-21 DIAGNOSIS — E785 Hyperlipidemia, unspecified: Secondary | ICD-10-CM | POA: Diagnosis present

## 2015-08-21 DIAGNOSIS — Z9981 Dependence on supplemental oxygen: Secondary | ICD-10-CM | POA: Diagnosis not present

## 2015-08-21 DIAGNOSIS — E876 Hypokalemia: Secondary | ICD-10-CM

## 2015-08-21 DIAGNOSIS — G629 Polyneuropathy, unspecified: Secondary | ICD-10-CM | POA: Diagnosis present

## 2015-08-21 DIAGNOSIS — E878 Other disorders of electrolyte and fluid balance, not elsewhere classified: Secondary | ICD-10-CM | POA: Diagnosis present

## 2015-08-21 DIAGNOSIS — Z7189 Other specified counseling: Secondary | ICD-10-CM | POA: Diagnosis not present

## 2015-08-21 DIAGNOSIS — N401 Enlarged prostate with lower urinary tract symptoms: Secondary | ICD-10-CM | POA: Diagnosis present

## 2015-08-21 DIAGNOSIS — M109 Gout, unspecified: Secondary | ICD-10-CM | POA: Diagnosis present

## 2015-08-21 DIAGNOSIS — Z955 Presence of coronary angioplasty implant and graft: Secondary | ICD-10-CM | POA: Diagnosis not present

## 2015-08-21 DIAGNOSIS — R319 Hematuria, unspecified: Secondary | ICD-10-CM

## 2015-08-21 DIAGNOSIS — I10 Essential (primary) hypertension: Secondary | ICD-10-CM

## 2015-08-21 DIAGNOSIS — Z515 Encounter for palliative care: Secondary | ICD-10-CM | POA: Diagnosis not present

## 2015-08-21 DIAGNOSIS — Z6841 Body Mass Index (BMI) 40.0 and over, adult: Secondary | ICD-10-CM | POA: Diagnosis not present

## 2015-08-21 DIAGNOSIS — I130 Hypertensive heart and chronic kidney disease with heart failure and stage 1 through stage 4 chronic kidney disease, or unspecified chronic kidney disease: Secondary | ICD-10-CM | POA: Diagnosis present

## 2015-08-21 DIAGNOSIS — R532 Functional quadriplegia: Secondary | ICD-10-CM | POA: Diagnosis present

## 2015-08-21 DIAGNOSIS — T83518A Infection and inflammatory reaction due to other urinary catheter, initial encounter: Secondary | ICD-10-CM | POA: Diagnosis present

## 2015-08-21 DIAGNOSIS — R2232 Localized swelling, mass and lump, left upper limb: Secondary | ICD-10-CM | POA: Diagnosis not present

## 2015-08-21 DIAGNOSIS — K219 Gastro-esophageal reflux disease without esophagitis: Secondary | ICD-10-CM | POA: Diagnosis present

## 2015-08-21 DIAGNOSIS — Z9289 Personal history of other medical treatment: Secondary | ICD-10-CM | POA: Diagnosis not present

## 2015-08-21 DIAGNOSIS — Z7401 Bed confinement status: Secondary | ICD-10-CM | POA: Diagnosis not present

## 2015-08-21 DIAGNOSIS — I69354 Hemiplegia and hemiparesis following cerebral infarction affecting left non-dominant side: Secondary | ICD-10-CM | POA: Diagnosis not present

## 2015-08-21 DIAGNOSIS — C44609 Unspecified malignant neoplasm of skin of left upper limb, including shoulder: Secondary | ICD-10-CM | POA: Diagnosis present

## 2015-08-21 DIAGNOSIS — I5032 Chronic diastolic (congestive) heart failure: Secondary | ICD-10-CM

## 2015-08-21 DIAGNOSIS — Y846 Urinary catheterization as the cause of abnormal reaction of the patient, or of later complication, without mention of misadventure at the time of the procedure: Secondary | ICD-10-CM | POA: Diagnosis present

## 2015-08-21 DIAGNOSIS — R2230 Localized swelling, mass and lump, unspecified upper limb: Secondary | ICD-10-CM | POA: Diagnosis present

## 2015-08-21 DIAGNOSIS — Z7902 Long term (current) use of antithrombotics/antiplatelets: Secondary | ICD-10-CM | POA: Diagnosis not present

## 2015-08-21 LAB — PROTIME-INR
INR: 1.15 (ref 0.00–1.49)
PROTHROMBIN TIME: 14.9 s (ref 11.6–15.2)

## 2015-08-21 LAB — URINE MICROSCOPIC-ADD ON

## 2015-08-21 LAB — COMPREHENSIVE METABOLIC PANEL
ALT: 10 U/L — AB (ref 17–63)
AST: 15 U/L (ref 15–41)
Albumin: 2.6 g/dL — ABNORMAL LOW (ref 3.5–5.0)
Alkaline Phosphatase: 45 U/L (ref 38–126)
Anion gap: 10 (ref 5–15)
BUN: 11 mg/dL (ref 6–20)
CHLORIDE: 95 mmol/L — AB (ref 101–111)
CO2: 33 mmol/L — AB (ref 22–32)
CREATININE: 1.08 mg/dL (ref 0.61–1.24)
Calcium: 8.7 mg/dL — ABNORMAL LOW (ref 8.9–10.3)
GFR calc Af Amer: >60 mL/min (ref >60)
GFR calc non Af Amer: >60 mL/min (ref >60)
Glucose, Bld: 120 mg/dL — ABNORMAL HIGH (ref 65–99)
POTASSIUM: 3.3 mmol/L — AB (ref 3.5–5.1)
SODIUM: 138 mmol/L (ref 135–145)
Total Bilirubin: 0.8 mg/dL (ref 0.3–1.2)
Total Protein: 6.1 g/dL — ABNORMAL LOW (ref 6.5–8.1)

## 2015-08-21 LAB — URINALYSIS, ROUTINE W REFLEX MICROSCOPIC
Bilirubin Urine: NEGATIVE
Glucose, UA: NEGATIVE mg/dL
Ketones, ur: NEGATIVE mg/dL
Nitrite: NEGATIVE
PROTEIN: NEGATIVE mg/dL
SPECIFIC GRAVITY, URINE: 1.013 (ref 1.005–1.030)
pH: 6.5 (ref 5.0–8.0)

## 2015-08-21 LAB — BASIC METABOLIC PANEL
ANION GAP: 7 (ref 5–15)
BUN: 9 mg/dL (ref 6–20)
CALCIUM: 8.5 mg/dL — AB (ref 8.9–10.3)
CO2: 35 mmol/L — ABNORMAL HIGH (ref 22–32)
Chloride: 94 mmol/L — ABNORMAL LOW (ref 101–111)
Creatinine, Ser: 1.08 mg/dL (ref 0.61–1.24)
Glucose, Bld: 118 mg/dL — ABNORMAL HIGH (ref 65–99)
POTASSIUM: 3.1 mmol/L — AB (ref 3.5–5.1)
SODIUM: 136 mmol/L (ref 135–145)

## 2015-08-21 LAB — BLOOD GAS, ARTERIAL
ACID-BASE EXCESS: 11 mmol/L — AB (ref 0.0–2.0)
Bicarbonate: 36 mEq/L — ABNORMAL HIGH (ref 20.0–24.0)
Drawn by: 40415
O2 Content: 3 L/min
O2 Saturation: 96.9 %
PCO2 ART: 56.7 mmHg — AB (ref 35.0–45.0)
Patient temperature: 98.6
TCO2: 37.7 mmol/L (ref 0–100)
pH, Arterial: 7.419 (ref 7.350–7.450)
pO2, Arterial: 92.6 mmHg (ref 80.0–100.0)

## 2015-08-21 LAB — I-STAT CG4 LACTIC ACID, ED: Lactic Acid, Venous: 1.39 mmol/L (ref 0.5–2.0)

## 2015-08-21 LAB — LIPASE, BLOOD: Lipase: 14 U/L (ref 11–51)

## 2015-08-21 LAB — CBC WITH DIFFERENTIAL/PLATELET
BASOS PCT: 1 %
Basophils Absolute: 0.1 10*3/uL (ref 0.0–0.1)
Eosinophils Absolute: 0.1 10*3/uL (ref 0.0–0.7)
Eosinophils Relative: 1 %
HEMATOCRIT: 40.4 % (ref 39.0–52.0)
HEMOGLOBIN: 12.9 g/dL — AB (ref 13.0–17.0)
LYMPHS ABS: 1.5 10*3/uL (ref 0.7–4.0)
Lymphocytes Relative: 14 %
MCH: 28.9 pg (ref 26.0–34.0)
MCHC: 31.9 g/dL (ref 30.0–36.0)
MCV: 90.4 fL (ref 78.0–100.0)
MONOS PCT: 7 %
Monocytes Absolute: 0.7 10*3/uL (ref 0.1–1.0)
NEUTROS ABS: 7.7 10*3/uL (ref 1.7–7.7)
NEUTROS PCT: 77 %
Platelets: 244 10*3/uL (ref 150–400)
RBC: 4.47 MIL/uL (ref 4.22–5.81)
RDW: 15.1 % (ref 11.5–15.5)
WBC: 10.1 10*3/uL (ref 4.0–10.5)

## 2015-08-21 LAB — TSH: TSH: 1.098 u[IU]/mL (ref 0.350–4.500)

## 2015-08-21 LAB — MAGNESIUM: MAGNESIUM: 1.7 mg/dL (ref 1.7–2.4)

## 2015-08-21 LAB — HIV ANTIBODY (ROUTINE TESTING W REFLEX): HIV Screen 4th Generation wRfx: NONREACTIVE

## 2015-08-21 LAB — AMMONIA: Ammonia: 32 umol/L (ref 9–35)

## 2015-08-21 LAB — RPR: RPR Ser Ql: NONREACTIVE

## 2015-08-21 LAB — GLUCOSE, CAPILLARY: Glucose-Capillary: 99 mg/dL (ref 65–99)

## 2015-08-21 LAB — VITAMIN B12: VITAMIN B 12: 517 pg/mL (ref 180–914)

## 2015-08-21 LAB — PROCALCITONIN

## 2015-08-21 MED ORDER — HYDROCODONE-ACETAMINOPHEN 5-325 MG PO TABS
1.0000 | ORAL_TABLET | ORAL | Status: DC | PRN
  Administered 2015-08-21: 1 via ORAL
  Administered 2015-08-22: 2 via ORAL
  Filled 2015-08-21 (×2): qty 2
  Filled 2015-08-21: qty 1

## 2015-08-21 MED ORDER — POLYVINYL ALCOHOL 1.4 % OP SOLN: 1.0000 [drp] | OPHTHALMIC | Status: DC | PRN

## 2015-08-21 MED ORDER — ONDANSETRON HCL 4 MG PO TABS: 4.0000 mg | ORAL_TABLET | Freq: Four times a day (QID) | ORAL | Status: DC | PRN

## 2015-08-21 MED ORDER — SODIUM CHLORIDE 0.9% FLUSH: 3.0000 mL | INTRAVENOUS | Status: DC | PRN

## 2015-08-21 MED ORDER — NITROGLYCERIN 0.4 MG SL SUBL: 0.4000 mg | SUBLINGUAL_TABLET | SUBLINGUAL | Status: DC | PRN

## 2015-08-21 MED ORDER — SODIUM CHLORIDE 0.9 % IV SOLN: 250.0000 mL | INTRAVENOUS | Status: DC | PRN

## 2015-08-21 MED ORDER — AMPICILLIN SODIUM 2 G IJ SOLR
2.0000 g | INTRAMUSCULAR | Status: DC
  Administered 2015-08-21 – 2015-08-23 (×13): 2 g via INTRAVENOUS
  Filled 2015-08-21 (×17): qty 2000

## 2015-08-21 MED ORDER — DEXTROSE 5 % IV SOLN
1.0000 g | Freq: Once | INTRAVENOUS | Status: AC
  Administered 2015-08-21: 1 g via INTRAVENOUS
  Filled 2015-08-21: qty 10

## 2015-08-21 MED ORDER — POLYETHYLENE GLYCOL 3350 17 G PO PACK: 17.0000 g | PACK | Freq: Every day | ORAL | Status: DC | PRN

## 2015-08-21 MED ORDER — EZETIMIBE 10 MG PO TABS
10.0000 mg | ORAL_TABLET | Freq: Every evening | ORAL | Status: DC
  Administered 2015-08-21 – 2015-08-24 (×4): 10 mg via ORAL
  Filled 2015-08-21 (×4): qty 1

## 2015-08-21 MED ORDER — POTASSIUM CHLORIDE 10 MEQ/100ML IV SOLN
INTRAVENOUS | Status: AC
  Administered 2015-08-21: 10 meq
  Filled 2015-08-21: qty 100

## 2015-08-21 MED ORDER — CLOPIDOGREL BISULFATE 75 MG PO TABS
75.0000 mg | ORAL_TABLET | Freq: Every day | ORAL | Status: DC
  Administered 2015-08-21 – 2015-08-25 (×5): 75 mg via ORAL
  Filled 2015-08-21 (×5): qty 1

## 2015-08-21 MED ORDER — BISACODYL 5 MG PO TBEC: 5.0000 mg | DELAYED_RELEASE_TABLET | Freq: Every day | ORAL | Status: DC | PRN

## 2015-08-21 MED ORDER — POTASSIUM CHLORIDE 10 MEQ/100ML IV SOLN
10.0000 meq | INTRAVENOUS | Status: AC
Start: 2015-08-21 — End: 2015-08-21
  Administered 2015-08-21 (×3): 10 meq via INTRAVENOUS
  Filled 2015-08-21 (×3): qty 100

## 2015-08-21 MED ORDER — IPRATROPIUM-ALBUTEROL 0.5-2.5 (3) MG/3ML IN SOLN
3.0000 mL | Freq: Four times a day (QID) | RESPIRATORY_TRACT | Status: DC | PRN
  Filled 2015-08-21: qty 3

## 2015-08-21 MED ORDER — ISOSORBIDE MONONITRATE ER 30 MG PO TB24
30.0000 mg | ORAL_TABLET | Freq: Every day | ORAL | Status: DC
  Administered 2015-08-21 – 2015-08-25 (×5): 30 mg via ORAL
  Filled 2015-08-21 (×5): qty 1

## 2015-08-21 MED ORDER — ENOXAPARIN SODIUM 40 MG/0.4ML ~~LOC~~ SOLN: 40.0000 mg | SUBCUTANEOUS | Status: DC

## 2015-08-21 MED ORDER — TAMSULOSIN HCL 0.4 MG PO CAPS
0.4000 mg | ORAL_CAPSULE | Freq: Every day | ORAL | Status: DC
  Administered 2015-08-21 – 2015-08-25 (×5): 0.4 mg via ORAL
  Filled 2015-08-21 (×5): qty 1

## 2015-08-21 MED ORDER — DEXTROSE 5 % IV SOLN
2.0000 g | Freq: Three times a day (TID) | INTRAVENOUS | Status: DC
  Administered 2015-08-21 – 2015-08-23 (×7): 2 g via INTRAVENOUS
  Filled 2015-08-21 (×9): qty 2

## 2015-08-21 MED ORDER — ONDANSETRON HCL 4 MG/2ML IJ SOLN: 4.0000 mg | Freq: Four times a day (QID) | INTRAMUSCULAR | Status: DC | PRN

## 2015-08-21 MED ORDER — MAGNESIUM CITRATE PO SOLN: 1.0000 | Freq: Once | ORAL | Status: DC | PRN

## 2015-08-21 MED ORDER — FUROSEMIDE 20 MG PO TABS
40.0000 mg | ORAL_TABLET | Freq: Every day | ORAL | Status: DC
  Administered 2015-08-21 – 2015-08-22 (×2): 40 mg via ORAL
  Filled 2015-08-21 (×2): qty 2

## 2015-08-21 MED ORDER — ALPRAZOLAM 0.25 MG PO TABS
0.2500 mg | ORAL_TABLET | Freq: Two times a day (BID) | ORAL | Status: DC
  Administered 2015-08-21 – 2015-08-25 (×8): 0.25 mg via ORAL
  Filled 2015-08-21 (×9): qty 1

## 2015-08-21 MED ORDER — POTASSIUM CHLORIDE 10 MEQ/100ML IV SOLN: 10.0000 meq | INTRAVENOUS | Status: DC

## 2015-08-21 MED ORDER — ENOXAPARIN SODIUM 80 MG/0.8ML ~~LOC~~ SOLN
65.0000 mg | SUBCUTANEOUS | Status: DC
  Administered 2015-08-22 – 2015-08-25 (×4): 65 mg via SUBCUTANEOUS
  Filled 2015-08-21 (×4): qty 0.8

## 2015-08-21 MED ORDER — ACETAMINOPHEN 650 MG RE SUPP: 650.0000 mg | Freq: Four times a day (QID) | RECTAL | Status: DC | PRN

## 2015-08-21 MED ORDER — ENOXAPARIN SODIUM 80 MG/0.8ML ~~LOC~~ SOLN
80.0000 mg | SUBCUTANEOUS | Status: DC
  Administered 2015-08-21: 80 mg via SUBCUTANEOUS
  Filled 2015-08-21: qty 0.8

## 2015-08-21 MED ORDER — PANTOPRAZOLE SODIUM 40 MG PO TBEC
40.0000 mg | DELAYED_RELEASE_TABLET | Freq: Every day | ORAL | Status: DC
  Administered 2015-08-21 – 2015-08-25 (×5): 40 mg via ORAL
  Filled 2015-08-21 (×5): qty 1

## 2015-08-21 MED ORDER — ACETAMINOPHEN 325 MG PO TABS
650.0000 mg | ORAL_TABLET | Freq: Four times a day (QID) | ORAL | Status: DC | PRN
  Administered 2015-08-23 – 2015-08-24 (×2): 650 mg via ORAL
  Filled 2015-08-21 (×3): qty 2

## 2015-08-21 MED ORDER — SODIUM CHLORIDE 0.9% FLUSH
3.0000 mL | Freq: Two times a day (BID) | INTRAVENOUS | Status: DC
  Administered 2015-08-21 – 2015-08-25 (×8): 3 mL via INTRAVENOUS

## 2015-08-21 MED ORDER — GABAPENTIN 100 MG PO CAPS
200.0000 mg | ORAL_CAPSULE | Freq: Two times a day (BID) | ORAL | Status: DC
Start: 2015-08-21 — End: 2015-08-25
  Administered 2015-08-21 – 2015-08-25 (×9): 200 mg via ORAL
  Filled 2015-08-21 (×9): qty 2

## 2015-08-21 MED ORDER — PROPYLENE GLYCOL 0.6 % OP SOLN: 1.0000 [drp] | Freq: Every evening | OPHTHALMIC | Status: DC | PRN

## 2015-08-21 MED ORDER — DOCUSATE SODIUM 100 MG PO CAPS
100.0000 mg | ORAL_CAPSULE | Freq: Two times a day (BID) | ORAL | Status: DC
  Administered 2015-08-21 – 2015-08-25 (×9): 100 mg via ORAL
  Filled 2015-08-21 (×9): qty 1

## 2015-08-21 MED ORDER — POTASSIUM CHLORIDE CRYS ER 20 MEQ PO TBCR
40.0000 meq | EXTENDED_RELEASE_TABLET | Freq: Once | ORAL | Status: AC
  Administered 2015-08-21: 40 meq via ORAL
  Filled 2015-08-21: qty 2

## 2015-08-21 NOTE — Progress Notes (Signed)
Attempted to arrange meeting to assist with hospice services and [place] if needed. Wife and son not coming to hospital today. Wife to call PMT with times that son can be available for meeting, hopefully Monday. Pt currently resides at home. Totally bed bound and under [facility]. She reports that scheduling of services as not gone well as most of the nurses and CNA's they have sent she reports live over an hour away. Wife also reaching the point where she cannot continue to care for him alone even with hospice support given his weight, and clinical condition. Romona Curls, ANP-ACHPN

## 2015-08-21 NOTE — H&P (Signed)
History and Physical    Benjamin Kaiser [id number] DOB: 06-Feb-1935 DOA: 08/20/2015  PCP: Leonard Downing, MD   Patient coming from: Nursing home  Chief Complaint: Acute encephalopathy   HPI: Benjamin Kaiser is a 81 y.o. male with medical history significant for coronary artery disease with stents, chronic diastolic CHF, hypertension, hyperlipidemia, GERD, history of CVA with left-sided deficits, and urinary retention with chronic indwelling Foley catheter who presents from his nursing facility for evaluation of acute encephalopathy. History is obtained through discussion with the ED personnel, the patient's wife and son, and review of the EMR. Patient has reportedly experienced a dramatic decline in his health over the last several months and has been bedbound since October 2016. Over the past 2 weeks, patient has been noted to become increasingly confused to the point where he now is not recognizing his family. He has been evaluated for this by his primary care physician and home health nurse. The suspicion is been that he has had a recurrent Foley catheter associated UTI. Urinalysis was obtained sometime last week and patient was diagnosed with a UTI that could only be treated with injections per report. Patient's PCP made a house call and administered an IM antibiotic on 08/16/2015 and asked that another urine sample be brought to the lab on 08/19/2015. Patient has not made any appreciable improvement with this antibiotic treatment and has continued to deteriorate. His wife states that he has not been febrile or made any specific complaints. Patient is also suffering from a mass at the left forearm which per report of the patient's wife, was diagnosed as a skin cancer and surgical referral was placed. Surgeon evaluated the patient and deemed him not to be a surgical candidate. A dermatologist provided a ointment which the patient's wife has been applying twice daily. She cannot recall the name of  the ointment and will bring it in in the morning.  ED Course: Upon arrival to the ED, patient is found to be afebrile, saturating adequately on room air, bradycardic to the mid 50s, and vitals otherwise stable. EKG demonstrates a sinus rhythm with first degree AV nodal block and RBBB. Chest x-ray is notable for a tiny right pleural effusion but no other acute abnormalities. Head CT is negative for acute intracranial abnormality. BMP features a mild hypokalemia and hypochloremia, albumin of 2.6, and chronically elevated sodium bicarbonate 33. CBC is unremarkable, lactic acid is reassuring at 1.39, and INR is normal. Urinalysis features few bacteria, large leukocytes, negative nitrites, too numerous to count WBCs and large hemoglobin. Blood and urine cultures were obtained, 40 mEq oral potassium was given, and empiric treatment with Rocephin was administered in the emergency department. Patient remained hemodynamically stable in the ED and will be observed on the medical-surgical unit for ongoing evaluation and management of acute encephalopathy, possibly secondary to UTI.  Review of Systems:  All other systems reviewed and apart from HPI, are negative.  Past Medical History  Diagnosis Date  . Hypertension   . Seizure disorder ([place])   . Hyperlipidemia   . Bradycardia     secondary to Atenolol  . Gout   . Obesity   . DVT (deep venous thrombosis) ([place]) 2003    Right lower extremity  . Bladder calculi     status cystoscopy  . BPH (benign prostatic hypertrophy)     status post TURP  . History of stroke   . CAD (coronary artery disease)     a. NSTEMI (8/12). LHC with total  occlusion LAD and collaterals from RCA, 90% serial CFX stenoses. Patient was evaluated for CABG but was turned down by CVTS given obesity and poor functional status. He had DES to CFX placed instead.   . Chronic diastolic heart failure ([place])     a. Echo (8/12) with EF 45-50%, anteroseptal hypokinesis, mild LV hypertrophy, normal  RV, aortic sclerosis;  b. Echo (3/13) with EF 50-55%, mild AS.   [place] Aortic stenosis     a. mild by echo 04/2011  . GERD (gastroesophageal reflux disease)   . Neuropathy ([place])   . Stroke ([place])   . Stage III chronic kidney disease 01/01/2015    Past Surgical History  Procedure Laterality Date  . Transurethral resection of prostate    . Cataract surgery       reports that he has never smoked. He quit smokeless tobacco use about 26 years ago. His smokeless tobacco use included Chew. He reports that he does not drink alcohol or use illicit drugs.  Allergies  Allergen Reactions  . Niacin And Related Other (See Comments)    Occasional flushing, itching    Family History  Problem Relation Age of Onset  . Lung cancer Father   . Suicidality Son   . Stroke Son   . Heart attack Son   . Heart attack Son   . Fainting Brother      Prior to Admission medications   Medication Sig Start Date End Date Taking? Authorizing Provider  ALPRAZolam (XANAX) 0.25 MG tablet Take 0.25 mg by mouth 2 (two) times daily.   Yes Historical Provider, MD  clopidogrel (PLAVIX) 75 MG tablet Take 1 tablet by mouth  daily 08/05/15  Yes Larey Dresser, MD  docusate sodium (COLACE) 100 MG capsule Take 100 mg by mouth 2 (two) times daily.   Yes Historical Provider, MD  furosemide (LASIX) 40 MG tablet Take 1 tablet by mouth  daily 08/02/15  Yes Larey Dresser, MD  gabapentin (NEURONTIN) 300 MG capsule Take 2 capsules by mouth 3  times daily Patient taking differently: takes 1 capsule by mouth twice daily 03/05/15  Yes Garvin Fila, MD  ipratropium-albuterol (DUONEB) 0.5-2.5 (3) MG/3ML SOLN Take 3 mLs by nebulization every 6 (six) hours as needed. Patient taking differently: Take 3 mLs by nebulization every 6 (six) hours as needed (for SOB).  01/07/15  Yes Hosie Poisson, MD  isosorbide mononitrate (IMDUR) 30 MG 24 hr tablet Take 1 tablet by mouth  daily 07/11/15  Yes Larey Dresser, MD  neomycin-bacitracin-polymyxin  (NEOSPORIN) ointment Apply 1 application topically as needed for wound care.   Yes Historical Provider, MD  OXYGEN Inhale 4 L into the lungs continuous.    Yes Historical Provider, MD  pantoprazole (PROTONIX) 40 MG tablet Take 1 tablet (40 mg total) by mouth daily. NEED OV. Patient taking differently: Take 40 mg by mouth daily.  07/11/15  Yes Larey Dresser, MD  Propylene Glycol (SYSTANE BALANCE) 0.6 % SOLN Place 1-2 drops into both eyes at bedtime as needed (for dry eyes).   Yes Historical Provider, MD  Tamsulosin HCl (FLOMAX) 0.4 MG CAPS Take 0.4 mg by mouth daily.   Yes Historical Provider, MD  Vitamins A & D (VITAMIN A & D) ointment Apply 1 application topically as needed for dry skin.   Yes Historical Provider, MD  ZETIA 10 MG tablet Take 10 mg by mouth every evening. 05/17/15  Yes Historical Provider, MD  collagenase (SANTYL) ointment Apply topically daily. 04/21/15  Riccardo Dubin, MD  nitroGLYCERIN (NITROSTAT) 0.4 MG SL tablet Place 1 tablet (0.4 mg total) under the tongue every 5 (five) minutes as needed. 10/02/13   Larey Dresser, MD  nystatin (MYCOSTATIN/NYSTOP) 100000 UNIT/GM POWD Apply 2-3 times daily to affected areas. 04/21/15   Riccardo Dubin, MD  risperiDONE (RISPERDAL M-TABS) 0.5 MG disintegrating tablet Take 1 tablet (0.5 mg total) by mouth at bedtime. 04/21/15   Riccardo Dubin, MD  senna-docusate (SENOKOT-S) 8.6-50 MG tablet Take 2 tablets by mouth 2 (two) times daily. 04/21/15   [name], MD    Physical Exam: Filed Vitals:   08/21/15 0145 08/21/15 0200 08/21/15 0215 08/21/15 0230  BP: 129/79 116/76 136/89 120/82  Pulse: 57 57 56 56  Temp:      TempSrc:      Resp: 19 16 17 16   Height:      Weight:      SpO2: 99% 99% 98% 99%      Constitutional: NAD, calm, comfortable Eyes: PERTLA, lids and conjunctivae normal ENMT: Mucous membranes are dry. Posterior pharynx clear of any exudate or lesions.   Neck: normal, supple, no masses, no thyromegaly Respiratory: Coarse  upper airway noise transmitted throughout. Normal respiratory effort. No accessory muscle use.  Cardiovascular: Rate ~60 and regular, S1, S2, no significant m/r/g. No carotid bruits. No significant JVD. Abdomen: No distension, no tenderness, no masses palpated. Bowel sounds normal.  Musculoskeletal: no clubbing / cyanosis. Soft tissue mass ~7cm at dorsal aspect left forearm with ulceration, proteinaceous exudate and bloody drainage.   Skin: Aside from left arm findings described above, no significant rashes, lesions, ulcers. Warm, dry, well-perfused. Neurologic: PERRL, EOMI, no gross facial asymmetry. Makes eye-contact, verbal responses unintelligible, Babinski down-going b/l.  Psychiatric: Difficult to assess given the clinical situation.     Labs on Admission: I have personally reviewed following labs and imaging studies  CBC:  Recent Labs Lab 08/21/15 0045  WBC 10.1  NEUTROABS 7.7  HGB 12.9*  HCT 40.4  MCV 90.4  PLT [id number]   Basic Metabolic Panel:  Recent Labs Lab 08/21/15 0045  NA 138  K 3.3*  CL 95*  CO2 33*  GLUCOSE 120*  BUN 11  CREATININE 1.08  CALCIUM 8.7*   GFR: Estimated Creatinine Clearance: 87.6 mL/min (by C-G formula based on Cr of 1.08). Liver Function Tests:  Recent Labs Lab 08/21/15 0045  AST 15  ALT 10*  ALKPHOS 45  BILITOT 0.8  PROT 6.1*  ALBUMIN 2.6*    Recent Labs Lab 08/21/15 0045  LIPASE 14   No results for input(s): AMMONIA in the last 168 hours. Coagulation Profile:  Recent Labs Lab 08/21/15 0045  INR 1.15   Cardiac Enzymes: No results for input(s): CKTOTAL, CKMB, CKMBINDEX, TROPONINI in the last 168 hours. BNP (last 3 results) No results for input(s): PROBNP in the last 8760 hours. HbA1C: No results for input(s): HGBA1C in the last 72 hours. CBG: No results for input(s): GLUCAP in the last 168 hours. Lipid Profile: No results for input(s): CHOL, HDL, LDLCALC, TRIG, CHOLHDL, LDLDIRECT in the last 72 hours. Thyroid  Function Tests: No results for input(s): TSH, T4TOTAL, FREET4, T3FREE, THYROIDAB in the last 72 hours. Anemia Panel: No results for input(s): VITAMINB12, FOLATE, FERRITIN, TIBC, IRON, RETICCTPCT in the last 72 hours. Urine analysis:    Component Value Date/Time   COLORURINE YELLOW 08/21/2015 0013   APPEARANCEUR CLOUDY* 08/21/2015 0013   LABSPEC 1.013 08/21/2015 0013   PHURINE 6.5 08/21/2015 0013  GLUCOSEU NEGATIVE 08/21/2015 0013   HGBUR LARGE* 08/21/2015 0013   BILIRUBINUR NEGATIVE 08/21/2015 0013   KETONESUR NEGATIVE 08/21/2015 0013   PROTEINUR NEGATIVE 08/21/2015 0013   UROBILINOGEN 1.0 12/30/2014 1714   NITRITE NEGATIVE 08/21/2015 0013   LEUKOCYTESUR LARGE* 08/21/2015 0013   Sepsis Labs: @LABRCNTIP (procalcitonin:4,lacticidven:4) )No results found for this or any previous visit (from the past 240 hour(s)).   Radiological Exams on Admission: Dg Chest 1 View  08/20/2015  CLINICAL DATA:  Acute mental status change EXAM: CHEST 1 VIEW COMPARISON:  April 17, 2015 FINDINGS: The study is limited by patient positioning. Within this limitation, the heart, hila, mediastinum, lungs, and pleura are without definitive change. Minimal blunting the right costophrenic angle raises the possibility of a small right pleural effusion. IMPRESSION: Possible tiny right pleural effusion. The study is limited by positioning but otherwise unremarkable. Electronically Signed   By: Dorise Bullion III M.D   On: 08/20/2015 23:51   Ct Head Wo Contrast  08/20/2015  CLINICAL DATA:  81 year old male with altered mental status EXAM: CT HEAD WITHOUT CONTRAST TECHNIQUE: Contiguous axial images were obtained from the base of the skull through the vertex without intravenous contrast. COMPARISON:  Head CT dated 04/15/2015 FINDINGS: There is moderate age-related atrophy and chronic microvascular ischemic changes. Right frontal old infarct and encephalomalacia. There is a focal area of hypodensity in the right posterior  parietal convexity with loss of gray-white matter discrimination of likely within the twin old infarct. There is stable mild asymmetric prominence of the right lateral ventricle. There is no acute intracranial hemorrhage. No mass effect or midline shift noted. There is diffuse mucoperiosteal thickening of the paranasal sinuses with postsurgical changes of the right frontal calvarium. No air-fluid level. The mastoid air cells are clear. Hyperostosis frontalis. No acute calvarial fracture IMPRESSION: No acute intracranial hemorrhage. Moderate age-related atrophy and chronic microvascular ischemic changes. Right frontal and right posterior parietal old infarcts and encephalomalacia. Stable appearing postsurgical changes of right frontal sinus with chronic appearing mucoperiosteal thickening. No air-fluid level. If symptoms persist and there are no contraindications, MRI may provide better evaluation if clinically indicated. Electronically Signed   By: Anner Crete M.D.   On: 08/20/2015 23:34    EKG: Independently reviewed. Sinus rhythm, 1st degree AV block, RBBB   Assessment/Plan  1. Acute encephalopathy  - Uncertain etiology, UTI is likely contributing  - Head CT with no acute findings to explain presentation  - ABG with mild hypercarbia, but unlikely to explain presentation  - Ammonia, RPR, TSH, B12, folate pending  - Treating UTI empirically as below  - Palliative consultation requested as this may represent part of pt's continued overall decline   2. UTI with chronic Foley catheter - UA suggestive of infection, culture incubating  - Catheter last changed out on 6/23  - Recent history of Enterococcus and Pseudomonas UTI - Received a dose of Rocephin in ED; had an IM abx administered by PCP on 6/20  - Treat with ampicillin and cefepime for presumed Enterococcus or Pseudomonas while awaiting culture data   3. Hypokalemia  - Serum potassium 3.3 on admission  - Supplemented with 40 mEq PO in  ED, but fell further to 3.1  - 30 mEq IV potassium given am of 6/25  - Mag level wnl  - Repeat chem panel tomorrow    4. Ulcerated mass, left arm - Pt has seen dermatology and per wife's report, told it is cancerous - Saw surgeon in consultation and was advised that he  is not a surgical candidate  - Has been treated with an ointment BID; wife will call with the name of it in the am  - There is bleeding from the site and copious proteinaceous exudate, but no pus or foul odor; faint surrounding erythema   - Wound care consultation requested for dressing recs   5. CAD - Had been referred for CABG but felt to be poor candidate and had  - Has DES to Cfx  - No acute ischemic features on EKG  - Continue current management with Plavix, Imdur   6. Chronic diastolic CHF  - Difficult to determine fluid status d/t body habitus; appears roughly euvolemic at time of admission  - Continue current management with Lasix  - SLIV, daily wts, strict I/O's    7. Debility and decline  - Hx of CVA with left-sided deficits  - Wife reports he has been bed bound since October 2016  - Wife has pursued home hospice services, but has had difficulty establishing this   - Palliative consultation requested    DVT prophylaxis: sq Lovenox  Code Status: DNR  Family Communication: Wife and son updated at bedside   Disposition Plan: Observe on med-surg   Consults called: None   Admission status: Observation     Vianne Bulls, MD [facility] Pager [phone]  If 7PM-7AM, please contact night-coverage [email or web address] Password [email or web address]  08/21/2015, 3:24 AM

## 2015-08-21 NOTE — Consult Note (Signed)
WOC wound consult note Reason for Consult: Left forearm mass/growth Wound type:neoplastic Pressure Ulcer POA: No Measurement: 5cm x 6cm with 1.5cm elevation  Wound bed: white, friable wound bed, vascular, but not bleeding Drainage (amount, consistency, odor) small to moderate amount of serous to light yellow exudate on old dressing, musty odor Periwound: intact with mild periwound erythema, moisture associated skin damage Dressing procedure/placement/frequency: Noted are the impressions from other providers and the palliative approach desired for this wound. Topical goals are best addressed by antimicrobial and astringent dressing, xeroform and I have provided Nursing with guidance for twice daily care using this product via the orders. [place] nursing team will not follow, but will remain available to this patient, the nursing and medical teams.  Please re-consult if needed. Thanks, Maudie Flakes, MSN, RN, [place], [place] Abbott  Pager# [phone]

## 2015-08-21 NOTE — Progress Notes (Signed)
New Admission Note:   Arrival Method: Via stretcher from the ED Mental Orientation:  A & O x 1 Telemetry: N/A Assessment: Completed Skin:  See documentation IV:  Right Hand and LUA PIV Pain: None Tubes: Chronic Foley Catheter changed by [facility] RN 08/19/2015 Safety Measures: Safety Fall Prevention Plan has been given, discussed and signed Admission: Completed [address] [place] Orientation: Patient has been orientated to the room, unit and staff.  Family:  Wife, Barbaraann Share, and son and daughter-in-law at the bedside  Patient is from home with his wife, Barbaraann Share.  She is his primary caregiver with some help from her son, Jordaan Curet, at night.  Her husband has been bed bound since October 2016 secondary to a CVA which left him with left sided weakness.  He has a chronic Foley Catheter which was changed by the [facility] RN on Friday, 08/19/2015.  He has a home MD which make house calls.  He receives home health services from [facility].  They are out of [facility].  The patient lives in [place] which is in [place].  The family are not happy with their care and want to change [facility] providers.  The currently receive [facility] RN weekly and HH Aide three times a week.  They also get the following medical equipment through [facility], Trapeze, Nebulizer, Oxygen Tanks, Clinical research associate, and [employer].  They are interested in [place] but do not want Hospice Care at this point.  The son has promised his dad that he would never have to go to a nursing home.  I explained that the criteria for [facility] is strict.  The wife, Barbaraann Share, is receptive to a [place] with family to discuss their options.  However, she is adamant that she wants to take him home and actively treat his illness.  The wife admitted that a lot of times she takes care of patient by herself.  The patient is total care and is 6'2" and weighs 133.675 kg.  We discussed the concern for potential caregiver strain.  The  daughter-in-law was also concerned.  A Case Manager consult has been requested.  Also, a Palliative Care Consult and Wound Care Consult have been ordered by the admitting physician.  Orders have been reviewed and implemented. Will continue to monitor the patient. Call light has been placed within reach and bed alarm has been activated.   Earleen Reaper RN- [place], [place] Phone number: [phone]

## 2015-08-21 NOTE — Progress Notes (Signed)
[facility] PLAN OF CARE NOTE Patient: Benjamin Kaiser [id number]   PCP: Leonard Downing, MD DOB: Feb 25, 1935   DOA: 08/20/2015   DOS: 08/21/2015    Patient was admitted by my colleague Dr.Opyd earlier on 08/21/2015. I have reviewed the H&P as well as assessment and plan and agree with the same. Important changes in the plan are listed below.  Plan of care: Principal Problem:   Acute encephalopathy Unclear regarding patient's baseline at present. Patient is following command occasionally. Continue current management but continue to engage with family regarding discussion for hospice.  Author: Berle Mull, MD [facility] Pager: [phone] 08/21/2015 4:41 PM   If 7PM-7AM, please contact night-coverage at [email or web address], password [facility]

## 2015-08-21 NOTE — ED Provider Notes (Signed)
Patient initially seen and evaluated by Dr. Rogene Houston, presented with altered mental status and report of urinary tract infection diagnosed earlier in the week and having received a single dose of parenteral antibiotics several days ago. Workup is significant for evidence of urinary tract infection with too numerous to count RBCs and too numerous to count the RBCs. Urine is sent for culture and he is started on ceftriaxone. Case is discussed with Dr. Myna Hidalgo of [facility] who agrees to admit the patient.  Results for orders placed or performed during the hospital encounter of 08/20/15  Comprehensive metabolic panel  Result Value Ref Range   Sodium 138 135 - 145 mmol/L   Potassium 3.3 (L) 3.5 - 5.1 mmol/L   Chloride 95 (L) 101 - 111 mmol/L   CO2 33 (H) 22 - 32 mmol/L   Glucose, Bld 120 (H) 65 - 99 mg/dL   BUN 11 6 - 20 mg/dL   Creatinine, Ser 1.08 0.61 - 1.24 mg/dL   Calcium 8.7 (L) 8.9 - 10.3 mg/dL   Total Protein 6.1 (L) 6.5 - 8.1 g/dL   Albumin 2.6 (L) 3.5 - 5.0 g/dL   AST 15 15 - 41 U/L   ALT 10 (L) 17 - 63 U/L   Alkaline Phosphatase 45 38 - 126 U/L   Total Bilirubin 0.8 0.3 - 1.2 mg/dL   GFR calc non Af Amer >60 >60 mL/min   GFR calc Af Amer >60 >60 mL/min   Anion gap 10 5 - 15  Lipase, blood  Result Value Ref Range   Lipase 14 11 - 51 U/L  Protime-INR  Result Value Ref Range   Prothrombin Time 14.9 11.6 - 15.2 seconds   INR 1.15 0.00 - 1.49  CBC with Differential/Platelet  Result Value Ref Range   WBC 10.1 4.0 - 10.5 K/uL   RBC 4.47 4.22 - 5.81 MIL/uL   Hemoglobin 12.9 (L) 13.0 - 17.0 g/dL   HCT 40.4 39.0 - 52.0 %   MCV 90.4 78.0 - 100.0 fL   MCH 28.9 26.0 - 34.0 pg   MCHC 31.9 30.0 - 36.0 g/dL   RDW 15.1 11.5 - 15.5 %   Platelets 244 150 - 400 K/uL   Neutrophils Relative % 77 %   Neutro Abs 7.7 1.7 - 7.7 K/uL   Lymphocytes Relative 14 %   Lymphs Abs 1.5 0.7 - 4.0 K/uL   Monocytes Relative 7 %   Monocytes Absolute 0.7 0.1 - 1.0 K/uL   Eosinophils Relative 1 %    Eosinophils Absolute 0.1 0.0 - 0.7 K/uL   Basophils Relative 1 %   Basophils Absolute 0.1 0.0 - 0.1 K/uL  Urinalysis, Routine w reflex microscopic (not at [facility])  Result Value Ref Range   Color, Urine YELLOW YELLOW   APPearance CLOUDY (A) CLEAR   Specific Gravity, Urine 1.013 1.005 - 1.030   pH 6.5 5.0 - 8.0   Glucose, UA NEGATIVE NEGATIVE mg/dL   Hgb urine dipstick LARGE (A) NEGATIVE   Bilirubin Urine NEGATIVE NEGATIVE   Ketones, ur NEGATIVE NEGATIVE mg/dL   Protein, ur NEGATIVE NEGATIVE mg/dL   Nitrite NEGATIVE NEGATIVE   Leukocytes, UA LARGE (A) NEGATIVE  Urine microscopic-add on  Result Value Ref Range   Squamous Epithelial / LPF 0-5 (A) NONE SEEN   WBC, UA TOO NUMEROUS TO COUNT 0 - 5 WBC/hpf   RBC / HPF TOO NUMEROUS TO COUNT 0 - 5 RBC/hpf   Bacteria, UA FEW (A) NONE SEEN   Casts  HYALINE CASTS (A) NEGATIVE  I-Stat CG4 Lactic Acid, ED  Result Value Ref Range   Lactic Acid, Venous 1.39 0.5 - 2.0 mmol/L   Dg Chest 1 View  08/20/2015  CLINICAL DATA:  Acute mental status change EXAM: CHEST 1 VIEW COMPARISON:  April 17, 2015 FINDINGS: The study is limited by patient positioning. Within this limitation, the heart, hila, mediastinum, lungs, and pleura are without definitive change. Minimal blunting the right costophrenic angle raises the possibility of a small right pleural effusion. IMPRESSION: Possible tiny right pleural effusion. The study is limited by positioning but otherwise unremarkable. Electronically Signed   By: Dorise Bullion III M.D   On: 08/20/2015 23:51   Ct Head Wo Contrast  08/20/2015  CLINICAL DATA:  81 year old male with altered mental status EXAM: CT HEAD WITHOUT CONTRAST TECHNIQUE: Contiguous axial images were obtained from the base of the skull through the vertex without intravenous contrast. COMPARISON:  Head CT dated 04/15/2015 FINDINGS: There is moderate age-related atrophy and chronic microvascular ischemic changes. Right frontal old infarct and  encephalomalacia. There is a focal area of hypodensity in the right posterior parietal convexity with loss of gray-white matter discrimination of likely within the twin old infarct. There is stable mild asymmetric prominence of the right lateral ventricle. There is no acute intracranial hemorrhage. No mass effect or midline shift noted. There is diffuse mucoperiosteal thickening of the paranasal sinuses with postsurgical changes of the right frontal calvarium. No air-fluid level. The mastoid air cells are clear. Hyperostosis frontalis. No acute calvarial fracture IMPRESSION: No acute intracranial hemorrhage. Moderate age-related atrophy and chronic microvascular ischemic changes. Right frontal and right posterior parietal old infarcts and encephalomalacia. Stable appearing postsurgical changes of right frontal sinus with chronic appearing mucoperiosteal thickening. No air-fluid level. If symptoms persist and there are no contraindications, MRI may provide better evaluation if clinically indicated. Electronically Signed   By: Anner Crete M.D.   On: 08/20/2015 0000000      Delora Fuel, MD [id number]

## 2015-08-21 NOTE — ED Provider Notes (Signed)
CSN: [id number]     Arrival date & time 08/20/15  2037 History   First MD Initiated Contact with Patient 08/20/15 2137     Chief Complaint  Patient presents with  . Altered Mental Status     (Consider location/radiation/quality/duration/timing/severity/associated sxs/prior Treatment) Patient is a 81 y.o. male presenting with altered mental status. The history is provided by the patient, the spouse and the EMS personnel.  Altered Mental Status Presenting symptoms: confusion   Associated symptoms: weakness   Associated symptoms: no abdominal pain, no fever, no headaches, no nausea and no vomiting   Patient brought in by EMS. Patient's wife states that he's had worsening mental status decreased mental status over the past 2 weeks. Patient had been being treated by home doctor visits for urinary tract infection. On Tuesday he received an IM shot. Of some type of antibiotic. Patient's had a Foley catheter in place for some time. He's been bedridden. Patient's had strokes in the past with left-sided deficits. Patient's wife states hasn't been any fevers as been no nausea or vomiting no trouble breathing no cold symptoms. No complaint of belly pain no diarrhea. Catheter was changed over on Friday.  Patient normally on 4 L of oxygen at all times. Patient's past medical history is significant for hypertension prior strokes bradycardia coronary artery disease chronic diastolic heart failure neuropathy and stage III chronic kidney disease. Also history of aortic stenosis.  Past Medical History  Diagnosis Date  . Hypertension   . Seizure disorder ([place])   . Hyperlipidemia   . Bradycardia     secondary to Atenolol  . Gout   . Obesity   . DVT (deep venous thrombosis) ([place]) 2003    Right lower extremity  . Bladder calculi     status cystoscopy  . BPH (benign prostatic hypertrophy)     status post TURP  . History of stroke   . CAD (coronary artery disease)     a. NSTEMI (8/12). LHC with total  occlusion LAD and collaterals from RCA, 90% serial CFX stenoses. Patient was evaluated for CABG but was turned down by CVTS given obesity and poor functional status. He had DES to CFX placed instead.   . Chronic diastolic heart failure ([place])     a. Echo (8/12) with EF 45-50%, anteroseptal hypokinesis, mild LV hypertrophy, normal RV, aortic sclerosis;  b. Echo (3/13) with EF 50-55%, mild AS.   Marland Kitchen Aortic stenosis     a. mild by echo 04/2011  . GERD (gastroesophageal reflux disease)   . Neuropathy ([place])   . Stroke ([place])   . Stage III chronic kidney disease 01/01/2015   Past Surgical History  Procedure Laterality Date  . Transurethral resection of prostate    . Cataract surgery     Family History  Problem Relation Age of Onset  . Lung cancer Father   . Suicidality Son   . Stroke Son   . Heart attack Son   . Heart attack Son   . Fainting Brother    Social History  Substance Use Topics  . Smoking status: Never Smoker   . Smokeless tobacco: Former Systems developer    Types: [place] date: 02/26/1989  . Alcohol Use: No     Comment: Pt reports he use to drink alcohol    Review of Systems  Constitutional: Negative for fever.  HENT: Negative for congestion.   Respiratory: Negative for cough.   Cardiovascular: Negative for chest pain.  Gastrointestinal: Negative for nausea,  vomiting, abdominal pain and diarrhea.  Neurological: Positive for weakness. Negative for headaches.  Psychiatric/Behavioral: Positive for confusion.      Allergies  Niacin and related  Home Medications   Prior to Admission medications   Medication Sig Start Date End Date Taking? Authorizing Provider  ALPRAZolam (XANAX) 0.25 MG tablet Take 0.25 mg by mouth 2 (two) times daily.   Yes Historical Provider, MD  clopidogrel (PLAVIX) 75 MG tablet Take 1 tablet by mouth  daily 08/05/15  Yes Larey Dresser, MD  docusate sodium (COLACE) 100 MG capsule Take 100 mg by mouth 2 (two) times daily.   Yes Historical Provider, MD   furosemide (LASIX) 40 MG tablet Take 1 tablet by mouth  daily 08/02/15  Yes Larey Dresser, MD  gabapentin (NEURONTIN) 300 MG capsule Take 2 capsules by mouth 3  times daily Patient taking differently: takes 1 capsule by mouth twice daily 03/05/15  Yes Garvin Fila, MD  ipratropium-albuterol (DUONEB) 0.5-2.5 (3) MG/3ML SOLN Take 3 mLs by nebulization every 6 (six) hours as needed. Patient taking differently: Take 3 mLs by nebulization every 6 (six) hours as needed (for SOB).  01/07/15  Yes Hosie Poisson, MD  isosorbide mononitrate (IMDUR) 30 MG 24 hr tablet Take 1 tablet by mouth  daily 07/11/15  Yes Larey Dresser, MD  neomycin-bacitracin-polymyxin (NEOSPORIN) ointment Apply 1 application topically as needed for wound care.   Yes Historical Provider, MD  OXYGEN Inhale 4 L into the lungs continuous.    Yes Historical Provider, MD  pantoprazole (PROTONIX) 40 MG tablet Take 1 tablet (40 mg total) by mouth daily. NEED OV. Patient taking differently: Take 40 mg by mouth daily.  07/11/15  Yes Larey Dresser, MD  Propylene Glycol (SYSTANE BALANCE) 0.6 % SOLN Place 1-2 drops into both eyes at bedtime as needed (for dry eyes).   Yes Historical Provider, MD  Tamsulosin HCl (FLOMAX) 0.4 MG CAPS Take 0.4 mg by mouth daily.   Yes Historical Provider, MD  Vitamins A & D (VITAMIN A & D) ointment Apply 1 application topically as needed for dry skin.   Yes Historical Provider, MD  ZETIA 10 MG tablet Take 10 mg by mouth every evening. 05/17/15  Yes Historical Provider, MD  collagenase (SANTYL) ointment Apply topically daily. 04/21/15   Riccardo Dubin, MD  nitroGLYCERIN (NITROSTAT) 0.4 MG SL tablet Place 1 tablet (0.4 mg total) under the tongue every 5 (five) minutes as needed. 10/02/13   Larey Dresser, MD  nystatin (MYCOSTATIN/NYSTOP) 100000 UNIT/GM POWD Apply 2-3 times daily to affected areas. 04/21/15   Riccardo Dubin, MD  risperiDONE (RISPERDAL M-TABS) 0.5 MG disintegrating tablet Take 1 tablet (0.5 mg total) by  mouth at bedtime. 04/21/15   Riccardo Dubin, MD  senna-docusate (SENOKOT-S) 8.6-50 MG tablet Take 2 tablets by mouth 2 (two) times daily. 04/21/15   [name], MD   BP 115/78 mmHg  Pulse 56  Temp(Src) 97.7 F (36.5 C) (Oral)  Resp 16  Ht 6\' 2"  (1.88 m)  Wt 156.037 kg  BMI 44.15 kg/m2  SpO2 100% Physical Exam  Constitutional: He appears well-developed and well-nourished. No distress.  HENT:  Head: Normocephalic and atraumatic.  Mouth/Throat: Oropharynx is clear and moist.  Eyes: EOM are normal. Pupils are equal, round, and reactive to light.  Neck: Normal range of motion.  Cardiovascular: Normal rate and regular rhythm.   No murmur heard. Pulmonary/Chest: Effort normal and breath sounds normal.  Abdominal: Soft. Bowel sounds  are normal. There is no tenderness.  Musculoskeletal: He exhibits edema.  Bilateral leg edema. Cap refill to toes 2 seconds.  Neurological: No cranial nerve deficit.  Bilateral leg weakness. Left greater than right. Patient with the left-sided weakness of the upper extremity as well.  Skin: Skin is warm.  Nursing note and vitals reviewed.   ED Course  Procedures (including critical care time) Labs Review Labs Reviewed  URINE CULTURE  CULTURE, BLOOD (ROUTINE X 2)  CULTURE, BLOOD (ROUTINE X 2)  COMPREHENSIVE METABOLIC PANEL  LIPASE, BLOOD  PROTIME-INR  CBC WITH DIFFERENTIAL/PLATELET  URINALYSIS, ROUTINE W REFLEX MICROSCOPIC (NOT AT [facility])  I-STAT CG4 LACTIC ACID, ED    Imaging Review Dg Chest 1 View  08/20/2015  CLINICAL DATA:  Acute mental status change EXAM: CHEST 1 VIEW COMPARISON:  April 17, 2015 FINDINGS: The study is limited by patient positioning. Within this limitation, the heart, hila, mediastinum, lungs, and pleura are without definitive change. Minimal blunting the right costophrenic angle raises the possibility of a small right pleural effusion. IMPRESSION: Possible tiny right pleural effusion. The study is limited by positioning  but otherwise unremarkable. Electronically Signed   By: Dorise Bullion III M.D   On: 08/20/2015 23:51   Ct Head Wo Contrast  08/20/2015  CLINICAL DATA:  81 year old male with altered mental status EXAM: CT HEAD WITHOUT CONTRAST TECHNIQUE: Contiguous axial images were obtained from the base of the skull through the vertex without intravenous contrast. COMPARISON:  Head CT dated 04/15/2015 FINDINGS: There is moderate age-related atrophy and chronic microvascular ischemic changes. Right frontal old infarct and encephalomalacia. There is a focal area of hypodensity in the right posterior parietal convexity with loss of gray-white matter discrimination of likely within the twin old infarct. There is stable mild asymmetric prominence of the right lateral ventricle. There is no acute intracranial hemorrhage. No mass effect or midline shift noted. There is diffuse mucoperiosteal thickening of the paranasal sinuses with postsurgical changes of the right frontal calvarium. No air-fluid level. The mastoid air cells are clear. Hyperostosis frontalis. No acute calvarial fracture IMPRESSION: No acute intracranial hemorrhage. Moderate age-related atrophy and chronic microvascular ischemic changes. Right frontal and right posterior parietal old infarcts and encephalomalacia. Stable appearing postsurgical changes of right frontal sinus with chronic appearing mucoperiosteal thickening. No air-fluid level. If symptoms persist and there are no contraindications, MRI may provide better evaluation if clinically indicated. Electronically Signed   By: Anner Crete M.D.   On: 08/20/2015 23:34   I have personally reviewed and evaluated these images and lab results as part of my medical decision-making.   EKG Interpretation None      ED ECG REPORT   Date: 08/21/2015  Rate: 59  Rhythm: sinus bradycardia  QRS Axis: normal  Intervals: PR prolonged  ST/T Wave abnormalities: nonspecific T wave changes  Conduction  Disutrbances:right bundle branch block  Narrative Interpretation:   Old EKG Reviewed: none available  I have personally reviewed the EKG tracing and agree with the computerized printout as noted.      MDM   Final diagnoses:  Altered mental status    Patient with gradually worsening altered mental status for the past 2 weeks. Patient does have a indwelling Foley catheter. According to patient's spouse patient had a urinary tract infection and was receiving home IM injections of antibiotics. Last dose was Tuesday stay. Patient here alert and will follow commands. Patient has a history of being bedridden since October. Patient has no specific complaints here. Patient without  fever not tachycardic not hypotensive.  Labs are still pending. Which include a urinalysis.  Patient's chest x-ray without acute findings head CT with evidence of old stroke but no acute findings.  Patient is normally on 4 L of oxygen at all times.  Disposition will be based on the rest of the lab findings.    Fredia Sorrow, MD 08/21/15 [phone]

## 2015-08-21 NOTE — Progress Notes (Signed)
Meeting set with family to discuss hospice as well as [facility] for Tues., June 27 at 0900. Romona Curls, ANP

## 2015-08-22 LAB — GLUCOSE, CAPILLARY: Glucose-Capillary: 104 mg/dL — ABNORMAL HIGH (ref 65–99)

## 2015-08-22 LAB — URINE CULTURE

## 2015-08-22 LAB — FOLATE RBC
FOLATE, HEMOLYSATE: 402.2 ng/mL
Folate, RBC: 1039 ng/mL (ref >498)
HEMATOCRIT: 38.7 % (ref 37.5–51.0)

## 2015-08-22 MED ORDER — FUROSEMIDE 10 MG/ML IJ SOLN
40.0000 mg | Freq: Once | INTRAMUSCULAR | Status: AC
  Administered 2015-08-22: 40 mg via INTRAVENOUS
  Filled 2015-08-22: qty 4

## 2015-08-22 MED ORDER — ENSURE ENLIVE PO LIQD
237.0000 mL | Freq: Two times a day (BID) | ORAL | Status: DC
  Administered 2015-08-22 – 2015-08-25 (×5): 237 mL via ORAL

## 2015-08-22 NOTE — Care Management Important Message (Signed)
Important Message  Patient Details  Name: Benjamin Kaiser MRN: [id number] Date of Birth: [birth year]   Medicare Important Message Given:  Yes    Loann Quill 08/22/2015, 9:02 AM

## 2015-08-22 NOTE — Progress Notes (Signed)
[facility] Progress Note  Patient: Benjamin Kaiser [id number]   PCP: Leonard Downing, MD DOB: October 19, 1934   DOA: 08/20/2015   DOS: 08/22/2015   Date of Service: the patient was seen and examined on 08/22/2015  Subjective: Patient denies any acute complaint. No acute event identified. Appears stated volume overloaded mildly. Nutrition: Tolerating oral diet  Brief hospital course: Pt. with PMH of dementia, quadriplegia, chronic catheter; admitted on 08/20/2015, with complaint of confusion, was found to have suspected UTI versus progression of dementia. Currently further plan is palliative care meeting 08/23/2015.  Assessment and Plan: 1. Acute encephalopathy  - Uncertain etiology, UTI is likely contributing  - Head CT with no acute findings to explain presentation  - ABG with mild hypercarbia, but unlikely to explain presentation  - Ammonia, RPR, TSH, B12, folate unremarkable - Treating UTI empirically as below  - Palliative consultation requested as this may represent part of pt's continued overall decline   2. UTI with chronic Foley catheter - UA suggestive of infection, culture incubating  - Catheter last changed out on 6/23  - Recent history of Enterococcus and Pseudomonas UTI - Received a dose of Rocephin in ED; had an IM abx administered by PCP on 6/20  - Treat with ampicillin and cefepime for presumed Enterococcus or Pseudomonas while awaiting culture data   3. Hypokalemia  Resolved, we will recheck it tomorrow  4. Ulcerated mass, left arm - Pt has seen dermatology and per wife's report, told it is cancerous - Saw surgeon in consultation and was advised that he is not a surgical candidate  - Has been treated with an ointment BID; wife will call with the name of it in the am  - There is bleeding from the site and copious proteinaceous exudate, but no pus or foul odor; faint surrounding erythema  - Wound care consultation requested for dressing recs    5. CAD - Had been referred for CABG but felt to be poor candidate and had  - Has DES to Cfx  - No acute ischemic features on EKG  - Continue current management with Plavix, Imdur   6. Chronic diastolic CHF  - Difficult to determine fluid status d/t body habitus; appears roughly euvolemic at time of admission  - Continue current management with Lasix and give IV Lasix today. - SLIV, daily wts, strict I/O's   7. Debility and decline  - Hx of CVA with left-sided deficits  - Wife reports he has been bed bound since October 2016  - Wife has pursued home hospice services, but has had difficulty establishing this  - Palliative consultation requested   Pain management: When necessary Tylenol Activity: On hospice Bowel regimen: last BM prior to admission Diet: Regular diet DVT Prophylaxis: subcutaneous Heparin  Advance goals of care discussion: DNR/DNI, on hospice  Family Communication: no family was present at bedside, at the time of interview.   Disposition:  Discharge to home with hospice versus SNF with palliative care. Expected discharge date: 08/24/2015, pending family meeting  Consultants: Palliative care Procedures: None  Antibiotics: Anti-infectives    Start     Dose/Rate Route Frequency Ordered Stop   08/21/15 0600  ceFEPIme (MAXIPIME) 2 g in dextrose 5 % 50 mL IVPB     2 g 100 mL/hr over 30 Minutes Intravenous Every 8 hours 08/21/15 0333     08/21/15 0400  ampicillin (OMNIPEN) 2 g in sodium chloride 0.9 % 50 mL IVPB     2 g 150 mL/hr over  20 Minutes Intravenous Every 4 hours 08/21/15 0333     08/21/15 0200  cefTRIAXone (ROCEPHIN) 1 g in dextrose 5 % 50 mL IVPB     1 g 100 mL/hr over 30 Minutes Intravenous  Once 08/21/15 0157 08/21/15 0300        Intake/Output Summary (Last 24 hours) at 08/22/15 2003 Last data filed at 08/22/15 1838  Gross per 24 hour  Intake    910 ml  Output   2201 ml  Net  -1291 ml   Filed Weights   08/20/15 2046 08/21/15  0337 08/21/15 2011  Weight: 156.037 kg (344 lb) 133.675 kg (294 lb 11.2 oz) 137.304 kg (302 lb 11.2 oz)    Objective: Physical Exam: Filed Vitals:   08/21/15 1654 08/21/15 2011 08/22/15 0534 08/22/15 0747  BP: 134/71 145/80 116/74 101/83  Pulse: 55 58 78 66  Temp: 98.2 F (36.8 C) 98.6 F (37 C) 98.1 F (36.7 C) 97.6 F (36.4 C)  TempSrc: Oral Oral Oral Oral  Resp: 16 17 17 18   Height:  6\' 2"  (1.88 m)    Weight:  137.304 kg (302 lb 11.2 oz)    SpO2: 100% 100% 95% 90%    General: Alert, Awake . Appear in mild distress Eyes: PERRL, Conjunctiva normal ENT: Oral Mucosa clear moist. Neck: difficult to assess JVD, no Abnormal Mass Or lumps Cardiovascular: S1 and S2 Present, aortic systolic Murmur, Respiratory: Bilateral Air entry equal and Decreased, Clear to Auscultation, no Crackles, no wheezes Abdomen: Bowel Sound present, Soft and no tenderness Skin: no redness, no Rash  Extremities: bl Pedal edema, no calf tenderness Neurologic: Grossly no focal neuro deficit. Bilaterally Equal motor strength  Data Reviewed: CBC:  Recent Labs Lab 08/21/15 0045 08/21/15 0357  WBC 10.1  --   NEUTROABS 7.7  --   HGB 12.9*  --   HCT 40.4 38.7  MCV 90.4  --   PLT 244  --    Basic Metabolic Panel:  Recent Labs Lab 08/21/15 0045 08/21/15 0357  NA 138 136  K 3.3* 3.1*  CL 95* 94*  CO2 33* 35*  GLUCOSE 120* 118*  BUN 11 9  CREATININE 1.08 1.08  CALCIUM 8.7* 8.5*  MG  --  1.7    Liver Function Tests:  Recent Labs Lab 08/21/15 0045  AST 15  ALT 10*  ALKPHOS 45  BILITOT 0.8  PROT 6.1*  ALBUMIN 2.6*    Recent Labs Lab 08/21/15 0045  LIPASE 14    Recent Labs Lab 08/21/15 0357  AMMONIA 32   Coagulation Profile:  Recent Labs Lab 08/21/15 0045  INR 1.15   Cardiac Enzymes: No results for input(s): CKTOTAL, CKMB, CKMBINDEX, TROPONINI in the last 168 hours. BNP (last 3 results) No results for input(s): PROBNP in the last 8760 hours.  CBG:  Recent  Labs Lab 08/21/15 0729 08/22/15 0750  GLUCAP 99 104*    Studies: No results found.   Scheduled Meds: . ALPRAZolam  0.25 mg Oral BID  . ampicillin (OMNIPEN) IV  2 g Intravenous Q4H  . ceFEPime (MAXIPIME) IV  2 g Intravenous Q8H  . clopidogrel  75 mg Oral Daily  . docusate sodium  100 mg Oral BID  . enoxaparin (LOVENOX) injection  65 mg Subcutaneous Q24H  . ezetimibe  10 mg Oral QPM  . feeding supplement (ENSURE ENLIVE)  237 mL Oral BID BM  . gabapentin  200 mg Oral BID  . isosorbide mononitrate  30 mg Oral Daily  .  pantoprazole  40 mg Oral Daily  . sodium chloride flush  3 mL Intravenous Q12H  . tamsulosin  0.4 mg Oral Daily   Continuous Infusions:  PRN Meds: sodium chloride, acetaminophen **OR** acetaminophen, bisacodyl, HYDROcodone-acetaminophen, ipratropium-albuterol, magnesium citrate, nitroGLYCERIN, ondansetron **OR** ondansetron (ZOFRAN) IV, polyethylene glycol, polyvinyl alcohol, sodium chloride flush  Time spent: 30 minutes  Author: Berle Mull, MD [facility] Pager: [phone] 08/22/2015 8:03 PM  If 7PM-7AM, please contact night-coverage at [email or web address], password [email or web address] [email or web address]

## 2015-08-22 NOTE — Progress Notes (Signed)
Initial Nutrition Assessment  DOCUMENTATION CODES:   Obesity unspecified  INTERVENTION:  Provide Ensure Enlive po BID, each supplement provides 350 kcal and 20 grams of protein.  RD to continue to monitor for [place].   NUTRITION DIAGNOSIS:   Inadequate oral intake related to lethargy/confusion as evidenced by meal completion < 25%.  GOAL:   Patient will meet greater than or equal to 90% of their needs  MONITOR:   PO intake, Supplement acceptance, Weight trends, Labs, I & O's, Skin  REASON FOR ASSESSMENT:   Low [place]    ASSESSMENT:   81 y.o. male with medical history significant for coronary artery disease with stents, chronic diastolic CHF, hypertension, hyperlipidemia, GERD, history of CVA with left-sided deficits, and urinary retention with chronic indwelling Foley catheter who presents from his nursing facility for evaluation of acute encephalopathy.   Pt difficult to understand. Pt did not respond to most questions asked. Pt however did report appetite is fine with no abdominal discomfort. No family at bedside. RD unable to obtain nutrition history. Meal completion has been 0-5%. Question accuracy of most recent recorded weight. RD to order Ensure to aid in caloric and protein needs. Noted palliative care has been consulted. Plan for family meeting tomorrow to discuss hospice and [place].   Pt with no observed significant fat or muscle mass loss.   Labs and medications reviewed.   Diet Order:  DIET - DYS 1 Room service appropriate?: Yes; Fluid consistency:: Thin  Skin:   (L forearm mass/growth)  Last BM:  6/24  Height:   Ht Readings from Last 1 Encounters:  08/21/15 6\' 2"  (1.88 m)    Weight:   Wt Readings from Last 1 Encounters:  08/21/15 302 lb 11.2 oz (137.304 kg)    Ideal Body Weight:  86.36 kg  BMI:  Body mass index is 38.85 kg/(m^2).  Estimated Nutritional Needs:   Kcal:  2100-2300  Protein:  130-140 grams  Fluid:  Per MD  EDUCATION NEEDS:   No  education needs identified at this time  Corrin Parker, MS, RD, LDN Pager # [phone] After hours/ weekend pager # [phone]

## 2015-08-23 DIAGNOSIS — R4182 Altered mental status, unspecified: Secondary | ICD-10-CM

## 2015-08-23 DIAGNOSIS — R532 Functional quadriplegia: Secondary | ICD-10-CM

## 2015-08-23 LAB — CBC WITH DIFFERENTIAL/PLATELET
BASOS ABS: 0 10*3/uL (ref 0.0–0.1)
Basophils Relative: 0 %
EOS ABS: 0.3 10*3/uL (ref 0.0–0.7)
Eosinophils Relative: 4 %
HEMATOCRIT: 40.7 % (ref 39.0–52.0)
HEMOGLOBIN: 13.2 g/dL (ref 13.0–17.0)
LYMPHS PCT: 12 %
Lymphs Abs: 1 10*3/uL (ref 0.7–4.0)
MCH: 29.6 pg (ref 26.0–34.0)
MCHC: 32.4 g/dL (ref 30.0–36.0)
MCV: 91.3 fL (ref 78.0–100.0)
Monocytes Absolute: 0.7 10*3/uL (ref 0.1–1.0)
Monocytes Relative: 8 %
NEUTROS PCT: 76 %
Neutro Abs: 6.3 10*3/uL (ref 1.7–7.7)
Platelets: ADEQUATE 10*3/uL (ref 150–400)
RBC: 4.46 MIL/uL (ref 4.22–5.81)
RDW: 15.1 % (ref 11.5–15.5)
WBC: 8.3 10*3/uL (ref 4.0–10.5)

## 2015-08-23 LAB — COMPREHENSIVE METABOLIC PANEL
ALBUMIN: 2.4 g/dL — AB (ref 3.5–5.0)
ALK PHOS: 43 U/L (ref 38–126)
ALT: 8 U/L — AB (ref 17–63)
AST: 15 U/L (ref 15–41)
Anion gap: 8 (ref 5–15)
BILIRUBIN TOTAL: 0.7 mg/dL (ref 0.3–1.2)
BUN: 12 mg/dL (ref 6–20)
CALCIUM: 8.6 mg/dL — AB (ref 8.9–10.3)
CO2: 31 mmol/L (ref 22–32)
Chloride: 99 mmol/L — ABNORMAL LOW (ref 101–111)
Creatinine, Ser: 1.11 mg/dL (ref 0.61–1.24)
GFR calc Af Amer: >60 mL/min (ref >60)
GFR calc non Af Amer: >60 mL/min (ref >60)
GLUCOSE: 119 mg/dL — AB (ref 65–99)
POTASSIUM: 3.4 mmol/L — AB (ref 3.5–5.1)
Sodium: 138 mmol/L (ref 135–145)
TOTAL PROTEIN: 6.1 g/dL — AB (ref 6.5–8.1)

## 2015-08-23 LAB — PROTIME-INR
INR: 1.17 (ref 0.00–1.49)
Prothrombin Time: 15.1 seconds (ref 11.6–15.2)

## 2015-08-23 LAB — PROCALCITONIN: PROCALCITONIN: 0.11 ng/mL

## 2015-08-23 LAB — MAGNESIUM: Magnesium: 1.6 mg/dL — ABNORMAL LOW (ref 1.7–2.4)

## 2015-08-23 LAB — GLUCOSE, CAPILLARY: GLUCOSE-CAPILLARY: 102 mg/dL — AB (ref 65–99)

## 2015-08-23 MED ORDER — MAGNESIUM SULFATE 2 GM/50ML IV SOLN
2.0000 g | Freq: Once | INTRAVENOUS | Status: AC
  Administered 2015-08-23: 2 g via INTRAVENOUS
  Filled 2015-08-23: qty 50

## 2015-08-23 MED ORDER — POTASSIUM CHLORIDE 20 MEQ/15ML (10%) PO SOLN
40.0000 meq | ORAL | Status: AC
  Administered 2015-08-23 (×2): 40 meq via ORAL
  Filled 2015-08-23 (×2): qty 30

## 2015-08-23 MED ORDER — FOSFOMYCIN TROMETHAMINE 3 G PO PACK
3.0000 g | PACK | Freq: Once | ORAL | Status: AC
  Administered 2015-08-23: 3 g via ORAL
  Filled 2015-08-23: qty 3

## 2015-08-23 MED ORDER — FUROSEMIDE 10 MG/ML IJ SOLN
20.0000 mg | Freq: Once | INTRAMUSCULAR | Status: AC
  Administered 2015-08-23: 20 mg via INTRAVENOUS
  Filled 2015-08-23: qty 2

## 2015-08-23 NOTE — Progress Notes (Signed)
[facility] Progress Note  Patient: Benjamin Kaiser [id number]   PCP: Leonard Downing, MD DOB: 08/19/1934   DOA: 08/20/2015   DOS: 08/23/2015   Date of Service: the patient was seen and examined on 08/23/2015  Subjective: The patient complains of back pain. No nausea no vomiting. Progressive fatigue as per wife. Nutrition: Tolerating oral diet  Brief hospital course: Pt. with PMH of dementia, quadriplegia, chronic catheter; admitted on 08/20/2015, with complaint of confusion, was found to have suspected UTI versus progression of dementia. Currently further plan is continue IV diuresis and follow recommendation from PT consultation.  Assessment and Plan: 1. Acute encephalopathy  - Uncertain etiology, UTI less likely - Head CT with no acute findings to explain presentation  - ABG with mild hypercarbia, but unlikely to explain presentation  - Ammonia, RPR, TSH, B12, folate unremarkable -Patient finished3 days of IV antibiotics.  2. chronic Foley catheter, suspected UTI - Urine culture grows multiple species but no leukocytosis no fever here in the hospital. Less likely UTI. - Catheter last changed out on 6/23  - Recent history of Enterococcus and Pseudomonas UTI - Treated with ampicillin and cefepime for presumed Enterococcus or Pseudomonas.  3. Hypokalemia  - Replacing orally  4. Ulcerated mass, left arm - Pt has seen dermatology and per wife's report, told it is cancerous - Saw surgeon in consultation and was advised that he is not a surgical candidate  - Has been treated with an ointment BID; wife will call with the name of it in the am  - There is bleeding from the site and copious proteinaceous exudate, but no pus or foul odor; faint surrounding erythema  - Wound care consultation requested for dressing recs   5. CAD - Had been referred for CABG but felt to be poor candidate and has DES to Cfx  - No acute ischemic features on EKG  - Continue current  management with Plavix, Imdur    6. Chronic diastolic CHF  - After IV Lasix and responded adequately and we will continue IV 20 mg Lasix today. - daily wts, strict I/O's   7. Debility and physical deconditioning - Hx of CVA with left-sided deficits  - Wife reports he has been bed bound since October 2016  - Wife has pursued home hospice services, but has had difficulty establishing this  - Palliative consultation appreciated. Physical therapy will be consulted to assess the patient's ability to tolerate upper body therapy for short-term at SNF, to improve management at home with hospice.  Pain management: When necessary Tylenol Activity: On hospice, physical therapy consulted Bowel regimen: last BM 08/22/2015 Diet: Dysphagia type I diet DVT Prophylaxis: subcutaneous Heparin  Advance goals of care discussion: DNR/DNI, on hospice  Family Communication: family was present at bedside, at the time of interview.   Disposition:  Discharge to home with hospice versus SNF with palliative care. Expected discharge date: 08/24/2015, pending PT consultation  Consultants: Palliative care Procedures: None  Antibiotics: Anti-infectives    Start     Dose/Rate Route Frequency Ordered Stop   08/21/15 0600  ceFEPIme (MAXIPIME) 2 g in dextrose 5 % 50 mL IVPB  Status:  Discontinued     2 g 100 mL/hr over 30 Minutes Intravenous Every 8 hours 08/21/15 0333 08/23/15 1219   08/21/15 0400  ampicillin (OMNIPEN) 2 g in sodium chloride 0.9 % 50 mL IVPB  Status:  Discontinued     2 g 150 mL/hr over 20 Minutes Intravenous Every 4 hours 08/21/15 0333  08/23/15 1040   08/21/15 0200  cefTRIAXone (ROCEPHIN) 1 g in dextrose 5 % 50 mL IVPB     1 g 100 mL/hr over 30 Minutes Intravenous  Once 08/21/15 0157 08/21/15 0300        Intake/Output Summary (Last 24 hours) at 08/23/15 1327 Last data filed at 08/23/15 1006  Gross per 24 hour  Intake    980 ml  Output   3325 ml  Net  -2345 ml   Filed Weights    08/20/15 2046 08/21/15 0337 08/21/15 2011  Weight: 156.037 kg (344 lb) 133.675 kg (294 lb 11.2 oz) 137.304 kg (302 lb 11.2 oz)    Objective: Physical Exam: Filed Vitals:   08/22/15 0747 08/22/15 2024 08/23/15 0420 08/23/15 0800  BP: 101/83 117/71 133/79 154/33  Pulse: 66 59 56 64  Temp: 97.6 F (36.4 C) 98 F (36.7 C) 97.5 F (36.4 C) 98 F (36.7 C)  TempSrc: Oral     Resp: 18 20 20    Height:      Weight:      SpO2: 90% 100% 98% 92%    General: Alert, Awake . Appear in mild distress Eyes: PERRL, Conjunctiva normal ENT: Oral Mucosa clear moist. Neck: difficult to assess JVD, no Abnormal Mass Or lumps Cardiovascular: S1 and S2 Present, aortic systolic Murmur, Respiratory: Bilateral Air entry equal and Decreased, Clear to Auscultation, no Crackles, no wheezes Abdomen: Bowel Sound present, Soft and no tenderness Extremities: bl Pedal edema, no calf tenderness Neurologic: Grossly no focal neuro deficit. Bilaterally Equal motor strength  Data Reviewed: CBC:  Recent Labs Lab 08/21/15 0045 08/21/15 0357 08/23/15 1019  WBC 10.1  --  8.3  NEUTROABS 7.7  --  6.3  HGB 12.9*  --  13.2  HCT 40.4 38.7 40.7  MCV 90.4  --  91.3  PLT 244  --  PLATELET CLUMPS NOTED ON SMEAR, COUNT APPEARS ADEQUATE   Basic Metabolic Panel:  Recent Labs Lab 08/21/15 0045 08/21/15 0357 08/23/15 1019  NA 138 136 138  K 3.3* 3.1* 3.4*  CL 95* 94* 99*  CO2 33* 35* 31  GLUCOSE 120* 118* 119*  BUN 11 9 12   CREATININE 1.08 1.08 1.11  CALCIUM 8.7* 8.5* 8.6*  MG  --  1.7 1.6*    Liver Function Tests:  Recent Labs Lab 08/21/15 0045 08/23/15 1019  AST 15 15  ALT 10* 8*  ALKPHOS 45 43  BILITOT 0.8 0.7  PROT 6.1* 6.1*  ALBUMIN 2.6* 2.4*    Recent Labs Lab 08/21/15 0045  LIPASE 14    Recent Labs Lab 08/21/15 0357  AMMONIA 32   Coagulation Profile:  Recent Labs Lab 08/21/15 0045 08/23/15 1019  INR 1.15 1.17   Cardiac Enzymes: No results for input(s): CKTOTAL, CKMB,  CKMBINDEX, TROPONINI in the last 168 hours. BNP (last 3 results) No results for input(s): PROBNP in the last 8760 hours.  CBG:  Recent Labs Lab 08/21/15 0729 08/22/15 0750 08/23/15 0731  GLUCAP 99 104* 102*    Studies: No results found.   Scheduled Meds: . ALPRAZolam  0.25 mg Oral BID  . clopidogrel  75 mg Oral Daily  . docusate sodium  100 mg Oral BID  . enoxaparin (LOVENOX) injection  65 mg Subcutaneous Q24H  . ezetimibe  10 mg Oral QPM  . feeding supplement (ENSURE ENLIVE)  237 mL Oral BID BM  . gabapentin  200 mg Oral BID  . isosorbide mononitrate  30 mg Oral Daily  . magnesium sulfate  1 - 4 g bolus IVPB  2 g Intravenous Once  . pantoprazole  40 mg Oral Daily  . potassium chloride  40 mEq Oral Q2H  . sodium chloride flush  3 mL Intravenous Q12H  . tamsulosin  0.4 mg Oral Daily   Continuous Infusions:  PRN Meds: sodium chloride, acetaminophen **OR** acetaminophen, bisacodyl, HYDROcodone-acetaminophen, ipratropium-albuterol, magnesium citrate, nitroGLYCERIN, ondansetron **OR** ondansetron (ZOFRAN) IV, polyethylene glycol, polyvinyl alcohol, sodium chloride flush  Time spent: 30 minutes  Author: Berle Mull, MD [facility] Pager: [phone] 08/23/2015 1:27 PM  If 7PM-7AM, please contact night-coverage at [email or web address], password [facility]

## 2015-08-23 NOTE — Consult Note (Signed)
Consultation Note Date: 08/23/2015   Patient Name: Benjamin Kaiser  DOB: 1935-01-13  MRN: [id number]  Age / Sex: 81 y.o., male  PCP: Leonard Downing, MD Referring Physician: Lavina Hamman, MD  Reason for Consultation: Establishing goals of care and Hospice Evaluation  HPI/Patient Profile: 81 y.o. male  with past medical history of Coronary artery disease status post stents, chronic diastolic heart failure, hypertension, hyperlipidemia, GERD, history of CVA with left-sided deficits, chronic urinary retention with indwelling Foley catheter, severe functional debility bed bound since October 2016 admitted on 08/20/2015 with acute encephalopathy. Patient had been seen in his home by primary care provider for treatment of a suspected urinary tract infection after developing altered mental status, and agitation. Per chart review he was given an IM antibiotic. Since admission to the hospital he has been afebrile, vital signs stable. Patient does have an albumin of 2.6 and current weight is 302 pounds. He also has a sizable mass approximately 7 cm to his left forearm that per chart review and wife's report is cancerous. Reportedly he has been seen by surgery and is not a surgical candidate.. Patient has been given Lasix in the hospital and he did diurese 3 L  Clinical Assessment and Goals of Care: Met with patient and later son as well as wife outside of the patient's room. Per family patient is adamantly opposed to hospice and they did not wish to carry on goals of care discussion in front of him. He is however under hospice care through [facility] home health/hospice services. We did discuss goals of seeing if we can improve his mobility and strength in the bed. Family adamantly opposed to patient being permanently discharged to a skilled nursing facility.  NEXT OF KIN wife, Satish Hammers.    SUMMARY OF RECOMMENDATIONS     Continue DNR/DNI Family hopeful for short-term rehabilitation in a skilled nursing facility to see if he can improve strengthening to assist with turning in the bed, use of a trapeze, etc. patient is bedbound and is anticipated that he will remain so. Goal is not to get him to where he can walk at this point. Depending upon whether he is eligible for physical therapy under the circumstances are not he will ultimately return home with [place] hospice services. We did workout additional services to help assist with patient's care in the home I did discuss the overall disease trajectory related to chronic urinary tract infections as seen in a bedbound individual with dementia. Reviewed with spouse as well as son that eventually these urinary tract infections will begin to happen more frequently and will become resistant to antibiotics and ultimately lead to his death  Code Status/Advance Care Planning:  DNR    Symptom Management:   Pain: Continue with Tylenol when necessary. Per conversation with hospice nurse patient is still being followed by his primary care physician and has been reluctant to prescribing stronger pain medicine in this particular case we'll defer ongoing management to his primary care provider as well as hospice services  Agitation: Family's goals are currently to evaluate for underlying medical comorbidities such as urinary tract infection. Does sound as though patient has his days and nights mixed up which further evaluation with hospice and his primary care provider after discharge to find something to enable him to sleep and have scheduled that aligns with his wife's might be beneficial in terms of caregiver burnout  Palliative Prophylaxis:   Aspiration, Bowel Regimen, Delirium Protocol, Frequent Pain Assessment, Oral Care and Turn Reposition  Additional Recommendations (Limitations, Scope, Preferences):  Initiate Comfort Feeding, No Artificial Feeding, No Chemotherapy,  No Hemodialysis, No Radiation, No Surgical Procedures and No Tracheostomy  Psycho-social/Spiritual:   Desire for further Chaplaincy support:no  Additional Recommendations: Medicaid/Financial Assistance  Prognosis:   Less than 6 months  Discharge Planning: Hopeful for short term skilled nursing for rehab and return home with hospice with [facility] services      Primary Diagnoses: Present on Admission:  . Urinary tract infectious disease . Acute encephalopathy . CAD (coronary artery disease) . Diastolic CHF, chronic ([place]) . Functional quadriplegia (HCC) . Hypertension . Arm mass . Hypokalemia  I have reviewed the medical record, interviewed the patient and family, and examined the patient. The following aspects are pertinent.  Past Medical History  Diagnosis Date  . Hypertension   . Seizure disorder ([place])   . Hyperlipidemia   . Bradycardia     secondary to Atenolol  . Gout   . Obesity   . DVT (deep venous thrombosis) ([place]) 2003    Right lower extremity  . Bladder calculi     status cystoscopy  . BPH (benign prostatic hypertrophy)     status post TURP  . History of stroke   . CAD (coronary artery disease)     a. NSTEMI (8/12). LHC with total occlusion LAD and collaterals from RCA, 90% serial CFX stenoses. Patient was evaluated for CABG but was turned down by CVTS given obesity and poor functional status. He had DES to CFX placed instead.   . Chronic diastolic heart failure ([place])     a. Echo (8/12) with EF 45-50%, anteroseptal hypokinesis, mild LV hypertrophy, normal RV, aortic sclerosis;  b. Echo (3/13) with EF 50-55%, mild AS.   Marland Kitchen Aortic stenosis     a. mild by echo 04/2011  . GERD (gastroesophageal reflux disease)   . Neuropathy ([place])   . Stroke ([place])   . Stage III chronic kidney disease 01/01/2015   Social History   Social History  . Marital Status: Married    Spouse Name: Barbaraann Share   . Number of Children: 4  . Years of Education: 75   Social History  Main Topics  . Smoking status: Never Smoker   . Smokeless tobacco: Former Systems developer    Types: [place] date: 02/26/1989  . Alcohol Use: No     Comment: Pt reports he use to drink alcohol  . Drug Use: No  . Sexual Activity: Not Asked   Other Topics Concern  . None   Social History Narrative   Patient is married and has four sons, Only one still living.  Lives in [place]   Patient is right handed.   Patient has high school degree   Patient drinks 4 cups of caffeine daily   Family History  Problem Relation Age of Onset  . Lung cancer Father   . Suicidality Son   . Stroke Son   . Heart attack Son   . Heart attack Son   .  Fainting Brother    Scheduled Meds: . ALPRAZolam  0.25 mg Oral BID  . ampicillin (OMNIPEN) IV  2 g Intravenous Q4H  . ceFEPime (MAXIPIME) IV  2 g Intravenous Q8H  . clopidogrel  75 mg Oral Daily  . docusate sodium  100 mg Oral BID  . enoxaparin (LOVENOX) injection  65 mg Subcutaneous Q24H  . ezetimibe  10 mg Oral QPM  . feeding supplement (ENSURE ENLIVE)  237 mL Oral BID BM  . gabapentin  200 mg Oral BID  . isosorbide mononitrate  30 mg Oral Daily  . pantoprazole  40 mg Oral Daily  . sodium chloride flush  3 mL Intravenous Q12H  . tamsulosin  0.4 mg Oral Daily   Continuous Infusions:  PRN Meds:.sodium chloride, acetaminophen **OR** acetaminophen, bisacodyl, HYDROcodone-acetaminophen, ipratropium-albuterol, magnesium citrate, nitroGLYCERIN, ondansetron **OR** ondansetron (ZOFRAN) IV, polyethylene glycol, polyvinyl alcohol, sodium chloride flush Medications Prior to Admission:  Prior to Admission medications   Medication Sig Start Date End Date Taking? Authorizing Provider  ALPRAZolam (XANAX) 0.25 MG tablet Take 0.25 mg by mouth 2 (two) times daily.   Yes Historical Provider, MD  clopidogrel (PLAVIX) 75 MG tablet Take 1 tablet by mouth  daily 08/05/15  Yes Larey Dresser, MD  docusate sodium (COLACE) 100 MG capsule Take 100 mg by mouth 2 (two) times daily.    Yes Historical Provider, MD  furosemide (LASIX) 40 MG tablet Take 1 tablet by mouth  daily 08/02/15  Yes Larey Dresser, MD  gabapentin (NEURONTIN) 300 MG capsule Take 2 capsules by mouth 3  times daily Patient taking differently: takes 1 capsule by mouth twice daily 03/05/15  Yes Garvin Fila, MD  ipratropium-albuterol (DUONEB) 0.5-2.5 (3) MG/3ML SOLN Take 3 mLs by nebulization every 6 (six) hours as needed. Patient taking differently: Take 3 mLs by nebulization every 6 (six) hours as needed (for SOB).  01/07/15  Yes Hosie Poisson, MD  isosorbide mononitrate (IMDUR) 30 MG 24 hr tablet Take 1 tablet by mouth  daily 07/11/15  Yes Larey Dresser, MD  neomycin-bacitracin-polymyxin (NEOSPORIN) ointment Apply 1 application topically as needed for wound care.   Yes Historical Provider, MD  OXYGEN Inhale 4 L into the lungs continuous.    Yes Historical Provider, MD  pantoprazole (PROTONIX) 40 MG tablet Take 1 tablet (40 mg total) by mouth daily. NEED OV. Patient taking differently: Take 40 mg by mouth daily.  07/11/15  Yes Larey Dresser, MD  Propylene Glycol (SYSTANE BALANCE) 0.6 % SOLN Place 1-2 drops into both eyes at bedtime as needed (for dry eyes).   Yes Historical Provider, MD  Tamsulosin HCl (FLOMAX) 0.4 MG CAPS Take 0.4 mg by mouth daily.   Yes Historical Provider, MD  Vitamins A & D (VITAMIN A & D) ointment Apply 1 application topically as needed for dry skin.   Yes Historical Provider, MD  ZETIA 10 MG tablet Take 10 mg by mouth every evening. 05/17/15  Yes Historical Provider, MD  collagenase (SANTYL) ointment Apply topically daily. 04/21/15   Riccardo Dubin, MD  nitroGLYCERIN (NITROSTAT) 0.4 MG SL tablet Place 1 tablet (0.4 mg total) under the tongue every 5 (five) minutes as needed. 10/02/13   Larey Dresser, MD  nystatin (MYCOSTATIN/NYSTOP) 100000 UNIT/GM POWD Apply 2-3 times daily to affected areas. 04/21/15   Riccardo Dubin, MD  risperiDONE (RISPERDAL M-TABS) 0.5 MG disintegrating tablet Take 1  tablet (0.5 mg total) by mouth at bedtime. 04/21/15   Riccardo Dubin, MD  senna-docusate (SENOKOT-S) 8.6-50 MG tablet Take 2 tablets by mouth 2 (two) times daily. 04/21/15   [name], MD   Allergies  Allergen Reactions  . Niacin And Related Other (See Comments)    Occasional flushing, itching   Review of Systems  Unable to perform ROS   Physical Exam  Constitutional:  obese  HENT:  Head: Normocephalic and atraumatic.  Cardiovascular: Normal rate.   Pulmonary/Chest: Effort normal.  Abdominal: He exhibits distension.  Genitourinary:  foley  Musculoskeletal:  Functionally very debilitated. Can no longer assist with turning in the bed or moving his extremities  Neurological: He is alert.  Oriented to self  Skin: Skin is warm.  Psychiatric: His behavior is normal.  Nursing note and vitals reviewed.   Vital Signs: BP 154/33 mmHg  Pulse 64  Temp(Src) 98 F (36.7 C) (Oral)  Resp 20  Ht 6' 2" (1.88 m)  Wt 137.304 kg (302 lb 11.2 oz)  BMI 38.85 kg/m2  SpO2 92% Pain Assessment: PAINAD       SpO2: SpO2: 92 % O2 Device:SpO2: 92 % O2 Flow Rate: .O2 Flow Rate (L/min): 4 L/min  IO: Intake/output summary:  Intake/Output Summary (Last 24 hours) at 08/23/15 1021 Last data filed at 08/23/15 1006  Gross per 24 hour  Intake   1100 ml  Output   4125 ml  Net  -3025 ml    LBM: Last BM Date: 08/22/15 Baseline Weight: Weight: (!) 156.037 kg (344 lb) Most recent weight: Weight: (!) 137.304 kg (302 lb 11.2 oz)     Palliative Assessment/Data:   Flowsheet Rows        Most Recent Value   Intake Tab    Referral Department  Hospitalist   Unit at Time of Referral  Med/Surg Unit   Palliative Care Primary Diagnosis  Sepsis/Infectious Disease   Date Notified  08/21/15   Palliative Care Type  Return patient Palliative Care   Reason for referral  Clarify Goals of Care, Counsel Regarding Hospice   Date of Admission  08/20/15   Date first seen by Palliative Care  08/23/15   #  of days Palliative referral response time  2 Day(s)   # of days IP prior to Palliative referral  1   Clinical Assessment    Palliative Performance Scale Score  40%   Pain Max last 24 hours  Not able to report   Pain Min Last 24 hours  Not able to report   Dyspnea Max Last 24 Hours  Not able to report   Dyspnea Min Last 24 hours  Not able to report   Nausea Max Last 24 Hours  Not able to report   Nausea Min Last 24 Hours  Not able to report   Anxiety Max Last 24 Hours  Not able to report   Anxiety Min Last 24 Hours  Not able to report   Other Max Last 24 Hours  Not able to report   Psychosocial & Spiritual Assessment    Palliative Care Outcomes    Who was at the meeting?  son, wife, [facility]    Palliative Care Outcomes  Counseled regarding hospice   Palliative Care follow-up planned  No      Time In: 0900 Time Out: 1015 Time Total: 75 min Greater than 50%  of this time was spent counseling and coordinating care related to the above assessment and plan. Staffed with Dr. Posey Pronto  Signed by: Dory Horn, NP   Please contact Palliative  Medicine Team phone at [phone] for questions and concerns.  For individual provider: See Shea Evans

## 2015-08-24 DIAGNOSIS — Z7189 Other specified counseling: Secondary | ICD-10-CM | POA: Insufficient documentation

## 2015-08-24 DIAGNOSIS — Z515 Encounter for palliative care: Secondary | ICD-10-CM | POA: Insufficient documentation

## 2015-08-24 LAB — BASIC METABOLIC PANEL
Anion gap: 11 (ref 5–15)
BUN: 14 mg/dL (ref 6–20)
CO2: 31 mmol/L (ref 22–32)
CREATININE: 1.21 mg/dL (ref 0.61–1.24)
Calcium: 8.6 mg/dL — ABNORMAL LOW (ref 8.9–10.3)
Chloride: 97 mmol/L — ABNORMAL LOW (ref 101–111)
GFR calc Af Amer: >60 mL/min (ref >60)
GFR, EST NON AFRICAN AMERICAN: 55 mL/min — AB (ref >60)
GLUCOSE: 105 mg/dL — AB (ref 65–99)
POTASSIUM: 3.5 mmol/L (ref 3.5–5.1)
Sodium: 139 mmol/L (ref 135–145)

## 2015-08-24 LAB — MAGNESIUM: Magnesium: 2.1 mg/dL (ref 1.7–2.4)

## 2015-08-24 LAB — GLUCOSE, CAPILLARY: Glucose-Capillary: 94 mg/dL (ref 65–99)

## 2015-08-24 NOTE — Care Management Important Message (Signed)
Important Message  Patient Details  Name: Benjamin Kaiser MRN: [id number] Date of Birth: 22-Nov-1934   Medicare Important Message Given:  Yes    Loann Quill 08/24/2015, 10:18 AM

## 2015-08-24 NOTE — Progress Notes (Signed)
[facility] Progress Note  Patient: Benjamin Kaiser [id number]   PCP: Leonard Downing, MD DOB: [birth year]   DOA: 08/20/2015   DOS: 08/24/2015   Date of Service: the patient was seen and examined on 08/24/2015  Subjective: The patient is more lethargic today. Minimal oral intake today. Nutrition: Tolerating oral diet minimal oral intake  Brief hospital course: Pt. with PMH of dementia, quadriplegia, chronic catheter; admitted on 08/20/2015, with complaint of confusion, was found to have suspected UTI versus progression of dementia. Currently further plan is continue arrange for family meeting on 08/25/2015 and arrange for inpatient hospice.  Assessment and Plan: 1. Acute encephalopathy  - Uncertain etiology, UTI less likely - Head CT with no acute findings to explain presentation  - ABG with mild hypercarbia, but unlikely to explain presentation  - Ammonia, RPR, TSH, B12, folate unremarkable -Patient finished3 days of IV antibiotics.  2. chronic Foley catheter, suspected UTI - Urine culture grows multiple species but no leukocytosis no fever here in the hospital. Less likely UTI. - Catheter last changed out on 6/23  - Recent history of Enterococcus and Pseudomonas UTI - Treated with ampicillin and cefepime for presumed Enterococcus or Pseudomonas., Given one dose of fosfomycin.  3. Hypokalemia  - Stable  4. Ulcerated mass, left arm - Pt has seen dermatology and per wife's report, told it is cancerous - Saw surgeon in consultation and was advised that he is not a surgical candidate  - Has been treated with an ointment BID; wife will call with the name of it in the am  - There is bleeding from the site and copious proteinaceous exudate, but no pus or foul odor; faint surrounding erythema  - Wound care consultation requested for dressing recs   5. CAD - Had been referred for CABG but felt to be poor candidate and has DES to Cfx  - No acute ischemic features on  EKG  - Continue current management with Plavix, Imdur    6. Chronic diastolic CHF  - Resume oral Lasix on 08/25/2015.  7. Debility and physical deconditioning - Hx of CVA with left-sided deficits  - Wife reports he has been bed bound since October 2016  - Wife has pursued home hospice services, but has had difficulty establishing this  - Palliative consultation appreciated. Physical therapy was consulted and the patient is unwilling to continue with therapy due to severe pain. Discussed with patient's family and recommended that the best place for patient would be inpatient hospice and with his worsening lethargy and mentation his prognosis as guarded.  Pain management: When necessary Tylenol Activity: On hospice, physical therapy consulted Bowel regimen: last BM 08/22/2015 Diet: Dysphagia type I diet DVT Prophylaxis: subcutaneous Heparin  Advance goals of care discussion: DNR/DNI, on hospice  Family Communication: family was present at bedside, at the time of interview.   Disposition:  Discharge to home with hospice versus SNF with palliative care. Expected discharge date: 08/25/2015, inpatient hospice  Consultants: Palliative care Procedures: None  Antibiotics: Anti-infectives    Start     Dose/Rate Route Frequency Ordered Stop   08/21/15 0600  ceFEPIme (MAXIPIME) 2 g in dextrose 5 % 50 mL IVPB  Status:  Discontinued     2 g 100 mL/hr over 30 Minutes Intravenous Every 8 hours 08/21/15 0333 08/23/15 1219   08/21/15 0400  ampicillin (OMNIPEN) 2 g in sodium chloride 0.9 % 50 mL IVPB  Status:  Discontinued     2 g 150 mL/hr over 20 Minutes  Intravenous Every 4 hours 08/21/15 0333 08/23/15 1040   08/21/15 0200  cefTRIAXone (ROCEPHIN) 1 g in dextrose 5 % 50 mL IVPB     1 g 100 mL/hr over 30 Minutes Intravenous  Once 08/21/15 0157 08/21/15 0300        Intake/Output Summary (Last 24 hours) at 08/24/15 1834 Last data filed at 08/24/15 1729  Gross per 24 hour  Intake     220 ml  Output   1135 ml  Net   -915 ml   Filed Weights   08/21/15 0337 08/21/15 2011 08/23/15 1900  Weight: 133.675 kg (294 lb 11.2 oz) 137.304 kg (302 lb 11.2 oz) 139.027 kg (306 lb 8 oz)    Objective: Physical Exam: Filed Vitals:   08/23/15 1900 08/24/15 0502 08/24/15 0758 08/24/15 1728  BP: 139/64 143/66 131/71 144/69  Pulse: 69 70 67 64  Temp: 98.5 F (36.9 C) 99.1 F (37.3 C) 97.6 F (36.4 C) 97.6 F (36.4 C)  TempSrc: Axillary Axillary Axillary Axillary  Resp: 20 20 20 20   Height:      Weight: 139.027 kg (306 lb 8 oz)     SpO2: 93% 92% 95% 94%    General: Lethargic. Appear in mild distress Eyes: PERRL, Conjunctiva normal ENT: Oral Mucosa clear moist. Neck: difficult to assess JVD, no Abnormal Mass Or lumps Cardiovascular: S1 and S2 Present, aortic systolic Murmur, Respiratory: Bilateral Air entry equal and Decreased, Clear to Auscultation, no Crackles, no wheezes Abdomen: Bowel Sound present, Soft and no tenderness Extremities: bl Pedal edema, no calf tenderness Neurologic: Lethargic as well as disorientation this morning.  Data Reviewed: CBC:  Recent Labs Lab 08/21/15 0045 08/21/15 0357 08/23/15 1019  WBC 10.1  --  8.3  NEUTROABS 7.7  --  6.3  HGB 12.9*  --  13.2  HCT 40.4 38.7 40.7  MCV 90.4  --  91.3  PLT 244  --  PLATELET CLUMPS NOTED ON SMEAR, COUNT APPEARS ADEQUATE   Basic Metabolic Panel:  Recent Labs Lab 08/21/15 0045 08/21/15 0357 08/23/15 1019 08/24/15 0538  NA 138 136 138 139  K 3.3* 3.1* 3.4* 3.5  CL 95* 94* 99* 97*  CO2 33* 35* 31 31  GLUCOSE 120* 118* 119* 105*  BUN 11 9 12 14   CREATININE 1.08 1.08 1.11 1.21  CALCIUM 8.7* 8.5* 8.6* 8.6*  MG  --  1.7 1.6* 2.1    Liver Function Tests:  Recent Labs Lab 08/21/15 0045 08/23/15 1019  AST 15 15  ALT 10* 8*  ALKPHOS 45 43  BILITOT 0.8 0.7  PROT 6.1* 6.1*  ALBUMIN 2.6* 2.4*    Recent Labs Lab 08/21/15 0045  LIPASE 14    Recent Labs Lab 08/21/15 0357  AMMONIA 32     Coagulation Profile:  Recent Labs Lab 08/21/15 0045 08/23/15 1019  INR 1.15 1.17   Cardiac Enzymes: No results for input(s): CKTOTAL, CKMB, CKMBINDEX, TROPONINI in the last 168 hours. BNP (last 3 results) No results for input(s): PROBNP in the last 8760 hours.  CBG:  Recent Labs Lab 08/21/15 0729 08/22/15 0750 08/23/15 0731 08/24/15 0728  GLUCAP 99 104* 102* 94    Studies: No results found.   Scheduled Meds: . ALPRAZolam  0.25 mg Oral BID  . clopidogrel  75 mg Oral Daily  . docusate sodium  100 mg Oral BID  . enoxaparin (LOVENOX) injection  65 mg Subcutaneous Q24H  . ezetimibe  10 mg Oral QPM  . feeding supplement (ENSURE ENLIVE)  237  mL Oral BID BM  . gabapentin  200 mg Oral BID  . isosorbide mononitrate  30 mg Oral Daily  . pantoprazole  40 mg Oral Daily  . sodium chloride flush  3 mL Intravenous Q12H  . tamsulosin  0.4 mg Oral Daily   Continuous Infusions:  PRN Meds: sodium chloride, acetaminophen **OR** acetaminophen, bisacodyl, HYDROcodone-acetaminophen, ipratropium-albuterol, magnesium citrate, nitroGLYCERIN, ondansetron **OR** ondansetron (ZOFRAN) IV, polyethylene glycol, polyvinyl alcohol, sodium chloride flush  Time spent: 30 minutes  Author: Berle Mull, MD [facility] Pager: [phone] 08/24/2015 6:34 PM  If 7PM-7AM, please contact night-coverage at [email or web address], password [facility]

## 2015-08-24 NOTE — Evaluation (Signed)
Physical Therapy Evaluation Patient Details Name: Benjamin Kaiser MRN: [id number] DOB: 08/13/1934 Today's Date: 08/24/2015   History of Present Illness  HPI: Benjamin Kaiser is a 81 y.o. male with medical history significant for coronary artery disease with stents, chronic diastolic CHF, hypertension, hyperlipidemia, GERD, history of CVA with left-sided deficits, and urinary retention with chronic indwelling Foley catheter who presents for evaluation of acute encephalopathy.  Clinical Impression   Patient evaluated by Physical Therapy with no further acute PT needs identified. All education has been completed and the patient's wife has no further questions. Benjamin Kaiser is unable to participate in therapy and is resistant to mobility; The best recommendation I can make to support Benjamin Kaiser and his family is SNF/24 hour care/Residential Hospice; See below for any follow-up Physical Therapy or equipment needs. PT is signing off. Thank you for this referral.     Follow Up Recommendations SNF;Other (comment) (Residential Hospice)    Equipment Recommendations  None recommended by PT    Recommendations for Other Services       Precautions / Restrictions Precautions Precautions: Fall      Mobility  Bed Mobility Overal bed mobility: Needs Assistance;+2 for physical assistance Bed Mobility: Rolling Rolling: Total assist;+2 for physical assistance         General bed mobility comments: Attempted to roll, however pt quite resistent to move; balled fist in threatening manner and told us to stop; Opted to stop bed mobility attempts, but he would need Total assist at this point for bed mobility, rolling, and hygeine  Transfers                    Ambulation/Gait                Stairs            Wheelchair Mobility    Modified Rankin (Stroke Patients Only)       Balance                                             Pertinent Vitals/Pain Pain  Assessment: Faces Faces Pain Scale: Hurts little more Pain Location: Grimace with bil LE ROM  Pain Descriptors / Indicators: Grimacing Pain Intervention(s): Limited activity within patient's tolerance;Monitored during session    Home Living Family/patient expects to be discharged to:: Private residence Living Arrangements: Spouse/significant other;Children Available Help at Discharge: Family;Available 24 hours/day Type of Home: House       Home Layout: One level        Prior Function Level of Independence: Needs assistance   Gait / Transfers Assistance Needed: non ambulatory; have used their hoyer lift just a few times since October of last year  ADL's / Homemaking Assistance Needed: total assist        Hand Dominance   Dominant Hand: Right    Extremity/Trunk Assessment   Upper Extremity Assessment: Difficult to assess due to impaired cognition;RUE deficits/detail;LUE deficits/detail RUE Deficits / Details: Noting limitations in shoulder flexion, elevation with PROM; Once pt was more awake, he was able to make a fist     LUE Deficits / Details: no voluntary movement noted; limitations in shoulder flexion and elevation noted             Communication   Communication: HOH  Cognition Arousal/Alertness: Lethargic Behavior During Therapy:  (Sleepy; only words he spoke  were telling me to stop moving him) Overall Cognitive Status: Impaired/Different from baseline                 General Comments: Mrs. Kaiser tells me he was better able to talk, answer questions until recent decline    General Comments      Exercises        Assessment/Plan    PT Assessment Patent does not need any further PT services  PT Diagnosis Generalized weakness   PT Problem List    PT Treatment Interventions     PT Goals (Current goals can be found in the Care Plan section) Acute Rehab PT Goals Patient Stated Goal: Unable t state PT Goal Formulation: All assessment and  education complete, DC therapy    Frequency     Barriers to discharge        Co-evaluation               End of Session   Activity Tolerance: Treatment limited secondary to agitation Patient left: in bed;with family/visitor present;with call bell/phone within reach Nurse Communication: Mobility status;Need for lift equipment         Time: [id number] PT Time Calculation (min) (ACUTE ONLY): 19 min   Charges:   PT Evaluation $PT Eval Moderate Complexity: 1 Procedure     PT G Codes:        Quin Hoop 08/24/2015, 5:00 PM  Roney Marion, [place] Pager [phone] Office [phone]'

## 2015-08-24 NOTE — Progress Notes (Signed)
Daily Progress Note   Patient Name: Benjamin Kaiser       Date: 08/24/2015 DOB: 1935-01-04  Age: 81 y.o. MRN#: [id number] Attending Physician: Lavina Hamman, MD Primary Care Physician: Leonard Downing, MD Admit Date: 08/20/2015  Reason for Consultation/Follow-up: Establishing goals of care  Subjective:  patient appears weak chronically ill Resting in bed Wife at bedside, see discussions below:  Length of Stay: 3  Current Medications: Scheduled Meds:  . ALPRAZolam  0.25 mg Oral BID  . clopidogrel  75 mg Oral Daily  . docusate sodium  100 mg Oral BID  . enoxaparin (LOVENOX) injection  65 mg Subcutaneous Q24H  . ezetimibe  10 mg Oral QPM  . feeding supplement (ENSURE ENLIVE)  237 mL Oral BID BM  . gabapentin  200 mg Oral BID  . isosorbide mononitrate  30 mg Oral Daily  . pantoprazole  40 mg Oral Daily  . sodium chloride flush  3 mL Intravenous Q12H  . tamsulosin  0.4 mg Oral Daily    Continuous Infusions:    PRN Meds: sodium chloride, acetaminophen **OR** acetaminophen, bisacodyl, HYDROcodone-acetaminophen, ipratropium-albuterol, magnesium citrate, nitroGLYCERIN, ondansetron **OR** ondansetron (ZOFRAN) IV, polyethylene glycol, polyvinyl alcohol, sodium chloride flush  Physical Exam         Weak chronically ill appearing Mild distress Shallow regular L arm mass Edema Awake, confused.   Vital Signs: BP 131/71 mmHg  Pulse 67  Temp(Src) 97.6 F (36.4 C) (Axillary)  Resp 20  Ht 6\' 2"  (1.88 m)  Wt 139.027 kg (306 lb 8 oz)  BMI 39.34 kg/m2  SpO2 95% SpO2: SpO2: 95 % O2 Device: O2 Device: Nasal Cannula O2 Flow Rate: O2 Flow Rate (L/min): 4 L/min  Intake/output summary:  Intake/Output Summary (Last 24 hours) at 08/24/15 1529 Last data filed at 08/24/15 1500  Gross  per 24 hour  Intake    340 ml  Output   1560 ml  Net  -1220 ml   LBM: Last BM Date: 08/22/15 Baseline Weight: Weight: (!) 156.037 kg (344 lb) Most recent weight: Weight: (!) 139.027 kg (306 lb 8 oz)       Palliative Assessment/Data:    Flowsheet Rows        Most Recent Value   Intake Tab    Referral Department  Hospitalist   Unit at Time  of Referral  Med/Surg Unit   Palliative Care Primary Diagnosis  Cancer   Date Notified  08/21/15   Palliative Care Type  Return patient Palliative Care   Reason for referral  Clarify Goals of Care, Counsel Regarding Hospice   Date of Admission  08/20/15   Date first seen by Palliative Care  08/23/15   # of days Palliative referral response time  2 Day(s)   # of days IP prior to Palliative referral  1   Clinical Assessment    Palliative Performance Scale Score  30%   Pain Max last 24 hours  5   Pain Min Last 24 hours  4   Dyspnea Max Last 24 Hours  4   Dyspnea Min Last 24 hours  3   Nausea Max Last 24 Hours  Not able to report   Nausea Min Last 24 Hours  Not able to report   Anxiety Max Last 24 Hours  Not able to report   Anxiety Min Last 24 Hours  Not able to report   Other Max Last 24 Hours  Not able to report   Psychosocial & Spiritual Assessment    Palliative Care Outcomes    Patient/Family meeting held?  Yes   Who was at the meeting?  wife    Palliative Care Outcomes  Provided end of life care assistance   Patient/Family wishes: Interventions discontinued/not started   Other (Comment)   Palliative Care follow-up planned  Yes, Facility      Patient Active Problem List   Diagnosis Date Noted  . Altered mental status   . Arm mass 08/21/2015  . Hypokalemia 08/21/2015  . Palliative care encounter 04/20/2015  . Complicated UTI (urinary tract infection) 04/19/2015  . Urinary tract infectious disease   . Hypercapnic respiratory failure, chronic ([place]) 04/16/2015  . Unable to ambulate 04/16/2015  . Chronic indwelling Foley catheter  04/16/2015  . Obesity hypoventilation syndrome ([place])   . Confusion state   . Constipation   . Acute on chronic respiratory failure with hypercapnia ([place]) 04/15/2015  . Acute encephalopathy 01/02/2015  . CO2 narcosis 01/02/2015  . Functional quadriplegia ([place]) 01/02/2015  . Acute on chronic respiratory failure ([place])   . Morbid obesity with BMI of 40.0-44.9, adult ([place])   . Pressure ulcer 01/01/2015  . Stage III chronic kidney disease 01/01/2015  . Respiratory failure, acute and chronic ([place]), with hypoxia and hypercarbia 01/01/2015  . Hypoxia 12/30/2014  . Aortic stenosis 10/04/2013  . Hereditary and idiopathic peripheral neuropathy 05/23/2012  . CVA (cerebral vascular accident) ([place]) 04/28/2012  . Morbid obesity ([place]) 02/07/2011  . Hypertension 12/01/2010  . CAD (coronary artery disease) 10/31/2010  . Diastolic CHF, chronic ([place]) 10/31/2010  . Hyperlipidemia 10/31/2010  . Urinary retention 10/31/2010    Palliative Care Assessment & Plan   Patient Profile:    Assessment:  mass L arm Diastolic CHF ? UTI Failure to thrive  Progressive dementia Declining functional status, bed bound for more than 1 year.  Recommendations/Plan:  Unlikely that the patient will be able to participate fully with therapy, recommend residential hospice. Wife agreeable but doesn't want to make decisions without their son being present. Patient has only one living son, 3 sons deceased.   Goals of Care and Additional Recommendations:  Family meeting with son, patient, wife now scheduled for 08-25-15 at 27 AM, to discuss hospice, specifically residential hospice arrangements. Further recommendations will follow.   Code Status:    Code Status Orders  Start     Ordered   08/21/15 0321  Do not attempt resuscitation (DNR)   Continuous    Question Answer Comment  In the event of cardiac or respiratory ARREST Do not call a "code blue"   In the event of cardiac or respiratory ARREST Do not  perform Intubation, CPR, defibrillation or ACLS   In the event of cardiac or respiratory ARREST Use medication by any route, position, wound care, and other measures to relive pain and suffering. May use oxygen, suction and manual treatment of airway obstruction as needed for comfort.      08/21/15 0323    Code Status History    Date Active Date Inactive Code Status Order ID Comments User Context   04/20/2015 11:59 AM 04/21/2015  9:33 PM DNR [id number]  Pershing Proud, NP Inpatient   04/16/2015  5:03 AM 04/20/2015 11:59 AM Full Code [id number]  Bethena Roys, MD ED   12/30/2014  9:34 PM 01/07/2015  6:11 PM Full Code [id number]  Reubin Milan, MD Inpatient    Advance Directive Documentation        Most Recent Value   Type of Advance Directive  Living will, Healthcare Power of Attorney   Pre-existing out of facility DNR order (yellow form or pink MOST form)     "MOST" Form in Place?         Prognosis:   < 2 weeks  Discharge Planning:  To Be Determined  Care plan was discussed with  Wife, Dr Posey Pronto.   Thank you for allowing the Palliative Medicine Team to assist in the care of this patient.   Time In: 1400 Time Out: 1425 Total Time 25 Prolonged Time Billed  no       Greater than 50%  of this time was spent counseling and coordinating care related to the above assessment and plan.  Loistine Chance, MD [phone] pager [phone] office  Please contact Palliative Medicine Team phone at [phone] for questions and concerns.

## 2015-08-25 DIAGNOSIS — Z7189 Other specified counseling: Secondary | ICD-10-CM

## 2015-08-25 LAB — PROCALCITONIN: PROCALCITONIN: 0.16 ng/mL

## 2015-08-25 LAB — GLUCOSE, CAPILLARY: Glucose-Capillary: 92 mg/dL (ref 65–99)

## 2015-08-25 MED ORDER — ENSURE ENLIVE PO LIQD: 237.0000 mL | Freq: Two times a day (BID) | ORAL | Status: AC

## 2015-08-25 MED ORDER — POLYETHYLENE GLYCOL 3350 17 G PO PACK: 17.0000 g | PACK | Freq: Every day | ORAL | Status: AC | PRN

## 2015-08-25 NOTE — Progress Notes (Signed)
Daily Progress Note   Patient Name: Benjamin Kaiser       Date: 08/25/2015 DOB: May 01, 1934  Age: 81 y.o. MRN#: [id number] Attending Physician: Lavina Hamman, MD Primary Care Physician: Leonard Downing, MD Admit Date: 08/20/2015  Reason for Consultation/Follow-up: Establishing goals of care  Subjective:  patient appears weak chronically ill Resting in bed Wife at bedside, see discussions below:  Length of Stay: 4  Current Medications: Scheduled Meds:  . ALPRAZolam  0.25 mg Oral BID  . clopidogrel  75 mg Oral Daily  . docusate sodium  100 mg Oral BID  . enoxaparin (LOVENOX) injection  65 mg Subcutaneous Q24H  . ezetimibe  10 mg Oral QPM  . feeding supplement (ENSURE ENLIVE)  237 mL Oral BID BM  . gabapentin  200 mg Oral BID  . isosorbide mononitrate  30 mg Oral Daily  . pantoprazole  40 mg Oral Daily  . sodium chloride flush  3 mL Intravenous Q12H  . tamsulosin  0.4 mg Oral Daily    Continuous Infusions:    PRN Meds: sodium chloride, acetaminophen **OR** acetaminophen, bisacodyl, HYDROcodone-acetaminophen, ipratropium-albuterol, magnesium citrate, nitroGLYCERIN, ondansetron **OR** ondansetron (ZOFRAN) IV, polyethylene glycol, polyvinyl alcohol, sodium chloride flush  Physical Exam         Weak chronically ill appearing Mild distress Shallow regular L arm mass Edema Awake, confused.   Vital Signs: BP 114/65 mmHg  Pulse 70  Temp(Src) 98.4 F (36.9 C) (Oral)  Resp 19  Ht 6\' 2"  (1.88 m)  Wt 139.027 kg (306 lb 8 oz)  BMI 39.34 kg/m2  SpO2 93% SpO2: SpO2: 93 % O2 Device: O2 Device: Nasal Cannula O2 Flow Rate: O2 Flow Rate (L/min): 4 L/min  Intake/output summary:   Intake/Output Summary (Last 24 hours) at 08/25/15 0931 Last data filed at 08/25/15 0436  Gross  per 24 hour  Intake    220 ml  Output    975 ml  Net   -755 ml   LBM: Last BM Date: 08/23/15 Baseline Weight: Weight: (!) 156.037 kg (344 lb) Most recent weight: Weight: (!) 139.027 kg (306 lb 8 oz)       Palliative Assessment/Data:    Flowsheet Rows        Most Recent Value   Intake Tab    Referral Department  Hospitalist  Unit at Time of Referral  Med/Surg Unit   Palliative Care Primary Diagnosis  Cancer   Date Notified  08/21/15   Palliative Care Type  Return patient Palliative Care   Reason for referral  Clarify Goals of Care, Counsel Regarding Hospice   Date of Admission  08/20/15   Date first seen by Palliative Care  08/23/15   # of days Palliative referral response time  2 Day(s)   # of days IP prior to Palliative referral  1   Clinical Assessment    Palliative Performance Scale Score  30%   Pain Max last 24 hours  5   Pain Min Last 24 hours  4   Dyspnea Max Last 24 Hours  4   Dyspnea Min Last 24 hours  3   Nausea Max Last 24 Hours  Not able to report   Nausea Min Last 24 Hours  Not able to report   Anxiety Max Last 24 Hours  Not able to report   Anxiety Min Last 24 Hours  Not able to report   Other Max Last 24 Hours  Not able to report   Psychosocial & Spiritual Assessment    Palliative Care Outcomes    Patient/Family meeting held?  Yes   Who was at the meeting?  wife    Palliative Care Outcomes  Provided end of life care assistance   Patient/Family wishes: Interventions discontinued/not started   Other (Comment)   Palliative Care follow-up planned  Yes, Facility      Patient Active Problem List   Diagnosis Date Noted  . Encounter for palliative care   . Goals of care, counseling/discussion   . Altered mental status   . Arm mass 08/21/2015  . Hypokalemia 08/21/2015  . Palliative care encounter 04/20/2015  . Complicated UTI (urinary tract infection) 04/19/2015  . Urinary tract infectious disease   . Hypercapnic respiratory failure, chronic ([place])  04/16/2015  . Unable to ambulate 04/16/2015  . Chronic indwelling Foley catheter 04/16/2015  . Obesity hypoventilation syndrome ([place])   . Confusion state   . Constipation   . Acute on chronic respiratory failure with hypercapnia ([place]) 04/15/2015  . Acute encephalopathy 01/02/2015  . CO2 narcosis 01/02/2015  . Functional quadriplegia ([place]) 01/02/2015  . Acute on chronic respiratory failure ([place])   . Morbid obesity with BMI of 40.0-44.9, adult ([place])   . Pressure ulcer 01/01/2015  . Stage III chronic kidney disease 01/01/2015  . Respiratory failure, acute and chronic ([place]), with hypoxia and hypercarbia 01/01/2015  . Hypoxia 12/30/2014  . Aortic stenosis 10/04/2013  . Hereditary and idiopathic peripheral neuropathy 05/23/2012  . CVA (cerebral vascular accident) ([place]) 04/28/2012  . Morbid obesity ([place]) 02/07/2011  . Hypertension 12/01/2010  . CAD (coronary artery disease) 10/31/2010  . Diastolic CHF, chronic ([place]) 10/31/2010  . Hyperlipidemia 10/31/2010  . Urinary retention 10/31/2010    Palliative Care Assessment & Plan   Patient Profile:    Assessment:  mass L arm Diastolic CHF ? UTI Failure to thrive  Progressive dementia Declining functional status, bed bound for more than 1 year.  Recommendations/Plan:   family meeting with patient's wife and son: patient's friend died at [facility] several years ago, son states that the patient remembers his deceased friend asking him to be removed from [facility]. Patient's son and wife agree that patient would not want to go to [facility]. Not able to participate in PT. Prognosis limited to 2 weeks possibly. Patient bed bound. Discussed extensively with  wife, son with case Freight forwarder and CSW present:   Plan: D/C home with hospice, [facility] to continue. Need to hire private duty sitters.   Code Status:    Code Status Orders        Start     Ordered   08/21/15 0321  Do not attempt resuscitation (DNR)   Continuous      Question Answer Comment  In the event of cardiac or respiratory ARREST Do not call a "code blue"   In the event of cardiac or respiratory ARREST Do not perform Intubation, CPR, defibrillation or ACLS   In the event of cardiac or respiratory ARREST Use medication by any route, position, wound care, and other measures to relive pain and suffering. May use oxygen, suction and manual treatment of airway obstruction as needed for comfort.      08/21/15 0323    Code Status History    Date Active Date Inactive Code Status Order ID Comments User Context   04/20/2015 11:59 AM 04/21/2015  9:33 PM DNR [id number]  Pershing Proud, NP Inpatient   04/16/2015  5:03 AM 04/20/2015 11:59 AM Full Code [id number]  Bethena Roys, MD ED   12/30/2014  9:34 PM 01/07/2015  6:11 PM Full Code [id number]  Reubin Milan, MD Inpatient    Advance Directive Documentation        Most Recent Value   Type of Advance Directive  Living will, Healthcare Power of Attorney   Pre-existing out of facility DNR order (yellow form or pink MOST form)     "MOST" Form in Place?         Prognosis:   < 2 weeks  Discharge Planning:  Home with hospice support.   Care plan was discussed with  Wife, son and Dr Posey Pronto.   Thank you for allowing the Palliative Medicine Team to assist in the care of this patient.   Time In: 9 Time Out: 925 Total Time 25 Prolonged Time Billed  no       Greater than 50%  of this time was spent counseling and coordinating care related to the above assessment and plan.  Loistine Chance, MD [phone] pager [phone] office  Please contact Palliative Medicine Team phone at [phone] for questions and concerns.

## 2015-08-25 NOTE — Care Management Note (Signed)
Case Management Note  Patient Details  Name: Benjamin Kaiser MRN: [id number] Date of Birth: [birth year]  Subjective/Objective:          Case manager following for progression and d/c planning.          Action/Plan: 08/25/2015 Plan for pt to d/c to home with ongoing hospice services provided in the home by [facility]. [facility] notified and info faxed as requested( demographics, H&P, and D/C summary). Pt wife provided with list of [place] agencies who provide [facility] aides to assist with 24 hr care in the home. Pt to d/c and transport to home by ambulance, [facility] notified.   Expected Discharge Date:  08/25/2015               Expected Discharge Plan:  Home w Hospice Care  In-House Referral:  NA  Discharge planning Services  CM Consult  Post Acute Care Choice:  NA Choice offered to:  NA  DME Arranged:   NA DME Agency:   NA  HH Arranged:   NA HH Agency:   NA  Status of Service:  Completed, signed off  If discussed at [place] of Stay Meetings, dates discussed:    Additional Comments:  Adron Bene, RN 08/25/2015, 3:06 PM

## 2015-08-25 NOTE — Progress Notes (Signed)
Patient Discharge:  Disposition: Home on Hospice  Education: Given to spouse.  IV: Removed from left extremity.  Telemetry: not applicable  Follow-up appointments: not applicable  Prescriptions: Hospice   Transportation: Ambulance  Belongings: Spouse took all patients belongings home.

## 2015-08-25 NOTE — Discharge Summary (Signed)
[facility] Discharge Summary   Patient: Benjamin Kaiser [id number]   PCP: Leonard Downing, MD DOB: Jul 13, 1934   Date of admission: 08/20/2015   Date of discharge:  08/25/2015    Discharge Diagnoses:  Principal Problem:   Acute encephalopathy Active Problems:   CAD (coronary artery disease)   Diastolic CHF, chronic (HCC)   Hypertension   Functional quadriplegia ([place])   Chronic indwelling Foley catheter   Urinary tract infectious disease   Arm mass   Hypokalemia   Altered mental status   Encounter for palliative care   Goals of care, counseling/discussion  Admitted From: Home with hospice Disposition:  Home with hospice as per discussion and preference of family and patient  Recommendations for Outpatient Follow-up:  1. Please adjust medications as needed.   Follow-up Information    Follow up with [facility].   Specialty:  [facility] information:   [address] [place] [address] [phone]      Diet recommendation: Dysphagia type I diet  Activity: The patient is advised to gradually reintroduce usual activities.  Discharge Condition: fair  Code Status: DNR/DNI, home with hospice  History of present illness: As per the H and P dictated on admission, "Benjamin Kaiser is a 81 y.o. male with medical history significant for coronary artery disease with stents, chronic diastolic CHF, hypertension, hyperlipidemia, GERD, history of CVA with left-sided deficits, and urinary retention with chronic indwelling Foley catheter who presents from his nursing facility for evaluation of acute encephalopathy. History is obtained through discussion with the ED personnel, the patient's wife and son, and review of the EMR. Patient has reportedly experienced a dramatic decline in his health over the last several months and has been bedbound since October 2016. Over the past 2 weeks, patient has been noted to become increasingly confused to the  point where he now is not recognizing his family. He has been evaluated for this by his primary care physician and home health nurse. The suspicion is been that he has had a recurrent Foley catheter associated UTI. Urinalysis was obtained sometime last week and patient was diagnosed with a UTI that could only be treated with injections per report. Patient's PCP made a house call and administered an IM antibiotic on 08/16/2015 and asked that another urine sample be brought to the lab on 08/19/2015. Patient has not made any appreciable improvement with this antibiotic treatment and has continued to deteriorate. His wife states that he has not been febrile or made any specific complaints. Patient is also suffering from a mass at the left forearm which per report of the patient's wife, was diagnosed as a skin cancer and surgical referral was placed. Surgeon evaluated the patient and deemed him not to be a surgical candidate. A dermatologist provided a ointment which the patient's wife has been applying twice daily. She cannot recall the name of the ointment and will bring it in in the morning."  Hospital Course:   Summary of his active problems in the hospital is as following. 1. Acute encephalopathy waxing and waning - Delirium from medication, UTI less likely - Head CT with no acute findings to explain presentation  - ABG with mild hypercarbia, but unlikely to explain presentation  - Ammonia, RPR, TSH, B12, folate unremarkable -Patient finished3 days of IV antibiotics.  2. chronic Foley catheter, suspected UTI - Urine culture grows multiple species but no leukocytosis no fever here in the hospital. Less likely UTI. - Catheter  last changed out on 6/23  - Recent history of Enterococcus and Pseudomonas UTI - Treated with ampicillin and cefepime for presumed Enterococcus or Pseudomonas., Given one dose of fosfomycin.  3. Hypokalemia  - Stable  4. Ulcerated mass, left arm - Pt has seen  dermatology and per wife's report, told it is cancerous - Saw surgeon in consultation and was advised that he is not a surgical candidate  - On 5 fluorouracil ointment at home. Not a surgical candidate per dermatology as per outpatient evaluation. Continue wound care.  5. CAD -  PCI 2012 - No acute ischemic features on EKG  - Continue current management with Plavix, Imdur   6. Chronic diastolic CHF  - Resume oral Lasix on 08/25/2015.  7. Debility and physical deconditioning - Hx of CVA with left-sided deficits  - Wife reports he has been bed bound since October 2016  Physical therapy was consulted and the patient is unwilling to continue with therapy due to severe pain.  Discussed with patient's family and recommended that the best place for patient would be inpatient hospice and with his worsening lethargy and mentation his prognosis as guarded. Patient has waxing and waning mentation which can lead to aspiration pneumonias. Patient has chronic indwelling Foley catheter with recurrent UTIs. Patient also has bedbound state causing 4 decubitus ulcer. Also left upper extremity skin carcinoma can get secondarily infected. Patient is also at high risk for developing PE DVTs. As per the discussion family understood this risk but as per patient's request due to protracted take the patient home instead of residential hospice. Hospice agency was notified and discharge arrangements were made.  All other chronic medical condition were stable during the hospitalization.  Patient was seen by physical therapy, who recommended SNF, as per patient's son patient has requested never to be discharged to a facility and therefore they would like to take the patient home. Patient's hospice agency was notified. On the day of the discharge the patient's symptoms are well controlled, and no other acute medical condition were reported by patient. the patient was felt safe to be discharge at home with  hospice.  Procedures and Results:  None   Consultations:  Palliative care  DISCHARGE MEDICATION: Current Discharge Medication List    START taking these medications   Details  feeding supplement, ENSURE ENLIVE, (ENSURE ENLIVE) LIQD Take 237 mLs by mouth 2 (two) times daily between meals. Qty: 237 mL, Refills: 12    polyethylene glycol (MIRALAX / GLYCOLAX) packet Take 17 g by mouth daily as needed for mild constipation. Qty: 14 each, Refills: 0      CONTINUE these medications which have NOT CHANGED   Details  ALPRAZolam (XANAX) 0.25 MG tablet Take 0.25 mg by mouth 2 (two) times daily.    clopidogrel (PLAVIX) 75 MG tablet Take 1 tablet by mouth  daily Qty: 30 tablet, Refills: 0    furosemide (LASIX) 40 MG tablet Take 1 tablet by mouth  daily Qty: 90 tablet, Refills: 3    gabapentin (NEURONTIN) 300 MG capsule Take 2 capsules by mouth 3  times daily Qty: 540 capsule, Refills: 0    ipratropium-albuterol (DUONEB) 0.5-2.5 (3) MG/3ML SOLN Take 3 mLs by nebulization every 6 (six) hours as needed. Qty: 360 mL, Refills: 2    isosorbide mononitrate (IMDUR) 30 MG 24 hr tablet Take 1 tablet by mouth  daily Qty: 90 tablet, Refills: 0    neomycin-bacitracin-polymyxin (NEOSPORIN) ointment Apply 1 application topically as needed for wound care.  OXYGEN Inhale 4 L into the lungs continuous.     pantoprazole (PROTONIX) 40 MG tablet Take 1 tablet (40 mg total) by mouth daily. NEED OV. Qty: 90 tablet, Refills: 0    Propylene Glycol (SYSTANE BALANCE) 0.6 % SOLN Place 1-2 drops into both eyes at bedtime as needed (for dry eyes).    Tamsulosin HCl (FLOMAX) 0.4 MG CAPS Take 0.4 mg by mouth daily.    Vitamins A & D (VITAMIN A & D) ointment Apply 1 application topically as needed for dry skin.    ZETIA 10 MG tablet Take 10 mg by mouth every evening.    collagenase (SANTYL) ointment Apply topically daily. Qty: 15 g, Refills: 0    nitroGLYCERIN (NITROSTAT) 0.4 MG SL tablet Place 1  tablet (0.4 mg total) under the tongue every 5 (five) minutes as needed. Qty: 25 tablet, Refills: prn    nystatin (MYCOSTATIN/NYSTOP) 100000 UNIT/GM POWD Apply 2-3 times daily to affected areas. Qty: 60 g, Refills: 0    risperiDONE (RISPERDAL M-TABS) 0.5 MG disintegrating tablet Take 1 tablet (0.5 mg total) by mouth at bedtime. Qty: 30 tablet, Refills: 0    senna-docusate (SENOKOT-S) 8.6-50 MG tablet Take 2 tablets by mouth 2 (two) times daily. Qty: 60 tablet, Refills: 3      STOP taking these medications     docusate sodium (COLACE) 100 MG capsule        Allergies  Allergen Reactions  . Niacin And Related Other (See Comments)    Occasional flushing, itching    Discharge Exam: Filed Weights   08/21/15 [id number] 08/21/15 2011 08/23/15 1900  Weight: 133.675 kg (294 lb 11.2 oz) 137.304 kg (302 lb 11.2 oz) 139.027 kg (306 lb 8 oz)   Filed Vitals:   08/25/15 0436 08/25/15 0828  BP: 131/68 114/65  Pulse: 70 70  Temp: 98.5 F (36.9 C) 98.4 F (36.9 C)  Resp: 18 19   General: Appear in mild distress, no Rash; Oral Mucosa moist. Cardiovascular: S1 and S2 Present, aortic systolic Murmur, difficult to assess JVD Respiratory: Bilateral Air entry present and Clear to Auscultation, no Crackles, no wheezes Abdomen: Bowel Sound present, Soft and no tenderness Extremities: no Pedal edema, no calf tenderness Neurology: Generalized weakness and deconditioning  The results of significant diagnostics from this hospitalization (including imaging, microbiology, ancillary and laboratory) are listed below for reference.    Significant Diagnostic Studies: Dg Chest 1 View  08/20/2015  CLINICAL DATA:  Acute mental status change EXAM: CHEST 1 VIEW COMPARISON:  April 17, 2015 FINDINGS: The study is limited by patient positioning. Within this limitation, the heart, hila, mediastinum, lungs, and pleura are without definitive change. Minimal blunting the right costophrenic angle raises the possibility  of a small right pleural effusion. IMPRESSION: Possible tiny right pleural effusion. The study is limited by positioning but otherwise unremarkable. Electronically Signed   By: Dorise Bullion III M.D   On: 08/20/2015 23:51   Ct Head Wo Contrast  08/20/2015  CLINICAL DATA:  81 year old male with altered mental status EXAM: CT HEAD WITHOUT CONTRAST TECHNIQUE: Contiguous axial images were obtained from the base of the skull through the vertex without intravenous contrast. COMPARISON:  Head CT dated 04/15/2015 FINDINGS: There is moderate age-related atrophy and chronic microvascular ischemic changes. Right frontal old infarct and encephalomalacia. There is a focal area of hypodensity in the right posterior parietal convexity with loss of gray-white matter discrimination of likely within the twin old infarct. There is stable mild asymmetric prominence of the  right lateral ventricle. There is no acute intracranial hemorrhage. No mass effect or midline shift noted. There is diffuse mucoperiosteal thickening of the paranasal sinuses with postsurgical changes of the right frontal calvarium. No air-fluid level. The mastoid air cells are clear. Hyperostosis frontalis. No acute calvarial fracture IMPRESSION: No acute intracranial hemorrhage. Moderate age-related atrophy and chronic microvascular ischemic changes. Right frontal and right posterior parietal old infarcts and encephalomalacia. Stable appearing postsurgical changes of right frontal sinus with chronic appearing mucoperiosteal thickening. No air-fluid level. If symptoms persist and there are no contraindications, MRI may provide better evaluation if clinically indicated. Electronically Signed   By: Anner Crete M.D.   On: 08/20/2015 23:34    Microbiology: Recent Results (from the past 240 hour(s))  Culture, blood (Routine X 2) w Reflex to ID Panel     Status: None (Preliminary result)   Collection Time: 08/20/15 11:50 PM  Result Value Ref Range Status     Specimen Description BLOOD RIGHT ANTECUBITAL  Final   Special Requests IN PEDIATRIC BOTTLE 1CC  Final   Culture NO GROWTH 4 DAYS  Final   Report Status PENDING  Incomplete  Urine culture     Status: Abnormal   Collection Time: 08/21/15 12:13 AM  Result Value Ref Range Status   Specimen Description URINE, CLEAN CATCH  Final   Special Requests NONE  Final   Culture MULTIPLE SPECIES PRESENT, SUGGEST RECOLLECTION (A)  Final   Report Status 08/22/2015 FINAL  Final  Culture, blood (Routine X 2) w Reflex to ID Panel     Status: None (Preliminary result)   Collection Time: 08/21/15 12:33 AM  Result Value Ref Range Status   Specimen Description BLOOD LEFT ANTECUBITAL  Final   Special Requests BOTTLES DRAWN AEROBIC AND ANAEROBIC 5CC   Final   Culture NO GROWTH 4 DAYS  Final   Report Status PENDING  Incomplete     Labs: CBC:  Recent Labs Lab 08/21/15 0045 08/21/15 0357 08/23/15 1019  WBC 10.1  --  8.3  NEUTROABS 7.7  --  6.3  HGB 12.9*  --  13.2  HCT 40.4 38.7 40.7  MCV 90.4  --  91.3  PLT 244  --  PLATELET CLUMPS NOTED ON SMEAR, COUNT APPEARS ADEQUATE   Basic Metabolic Panel:  Recent Labs Lab 08/21/15 0045 08/21/15 0357 08/23/15 1019 08/24/15 0538  NA 138 136 138 139  K 3.3* 3.1* 3.4* 3.5  CL 95* 94* 99* 97*  CO2 33* 35* 31 31  GLUCOSE 120* 118* 119* 105*  BUN 11 9 12 14   CREATININE 1.08 1.08 1.11 1.21  CALCIUM 8.7* 8.5* 8.6* 8.6*  MG  --  1.7 1.6* 2.1   Liver Function Tests:  Recent Labs Lab 08/21/15 0045 08/23/15 1019  AST 15 15  ALT 10* 8*  ALKPHOS 45 43  BILITOT 0.8 0.7  PROT 6.1* 6.1*  ALBUMIN 2.6* 2.4*    Recent Labs Lab 08/21/15 0045  LIPASE 14    Recent Labs Lab 08/21/15 0357  AMMONIA 32   Cardiac Enzymes: No results for input(s): CKTOTAL, CKMB, CKMBINDEX, TROPONINI in the last 168 hours. BNP (last 3 results)  Recent Labs  04/15/15 2145  BNP 47.6   CBG:  Recent Labs Lab 08/21/15 0729 08/22/15 0750 08/23/15 0731  08/24/15 0728 08/25/15 0826  GLUCAP 99 104* 102* 94 92   Time spent: 30 minutes  Signed:  Amori Cooperman  [facility]  08/25/2015 , 11:42 AM

## 2015-08-26 LAB — CULTURE, BLOOD (ROUTINE X 2)
CULTURE: NO GROWTH
Culture: NO GROWTH

## 2015-08-29 ENCOUNTER — Other Ambulatory Visit: Payer: Self-pay | Admitting: Cardiology

## 2015-08-29 NOTE — Telephone Encounter (Signed)
Refill request for Imdur and Protonix. Last OV 04/21/14 with Aundra Dubin - pt supposed to F/U in 4 months. Appt for 08/20/14 was cancelled by provider and appt for 10/27/14 was cancelled by wife stating pt had fell. No new appt scheduled yet. Numerous request were noted on last few refills that pt needed an OV for additonal refills. Ok to refill both? Please advise

## 2015-08-29 NOTE — Telephone Encounter (Signed)
It looks like pt was discharged 08/25/15 to the care of inpatient Hospice, they would be managing medications.

## 2015-10-10 ENCOUNTER — Other Ambulatory Visit: Payer: Self-pay | Admitting: Cardiology

## 2015-11-21 ENCOUNTER — Other Ambulatory Visit: Payer: Self-pay | Admitting: Cardiology

## 2015-11-27 DEATH — deceased

## 2016-04-26 IMAGING — CT CT HEAD W/O CM
1 of 2 series · 14 of 30 positions shown, 18 images · non-contrast
Comparison: CT head December 30, 2014

CLINICAL DATA: Altered mental status, extreme confusion. History of
hypertension, seizure disorder, stroke.

EXAM:
CT HEAD WITHOUT CONTRAST
TECHNIQUE: Contiguous axial images were obtained from the base of the skull
through the vertex without intravenous contrast.

[Series 2: head 5.0 h30s · axial · 0.42mm/px · z∈[-155,-15]mm · 14 of 34 slices shown, 18 images]
[im 3/34  brain]
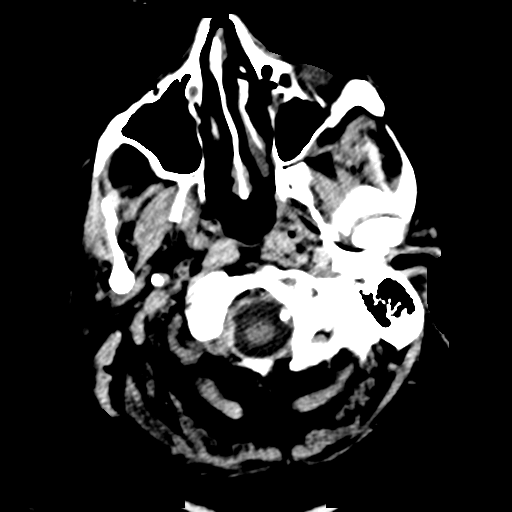
[im 3/34  bone]
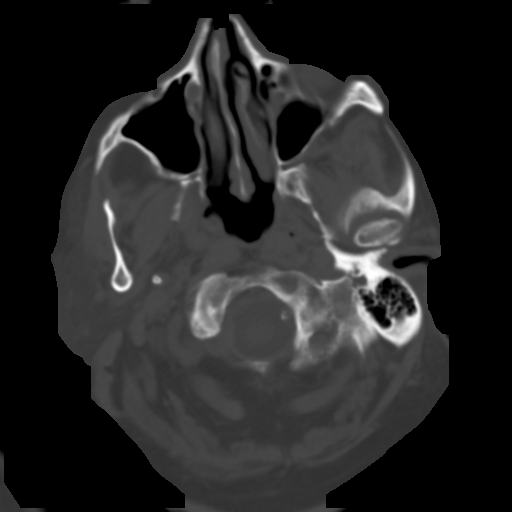
[im 5/34  brain]
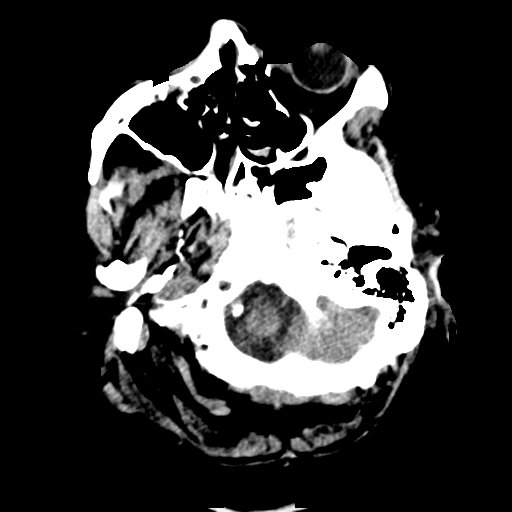
[im 7/34  brain]
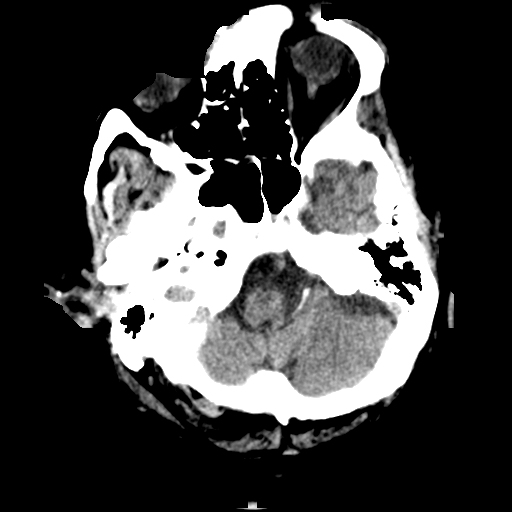
[im 9/34  brain]
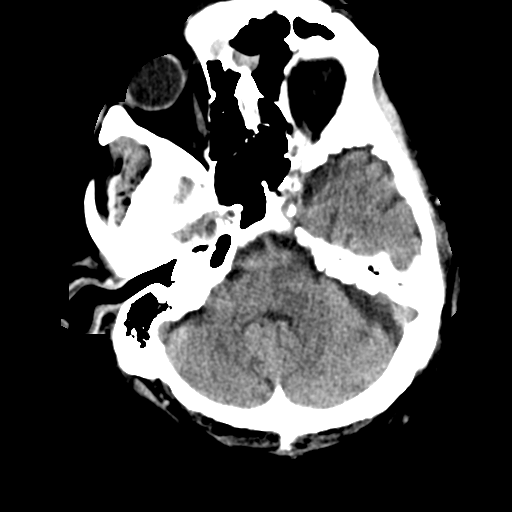
[im 12/34  brain]
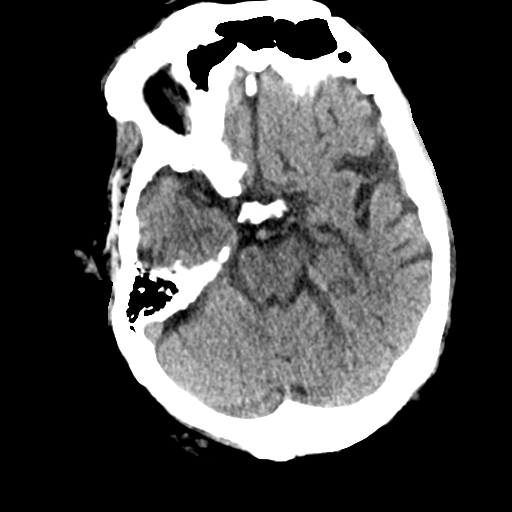
[im 12/34  bone]
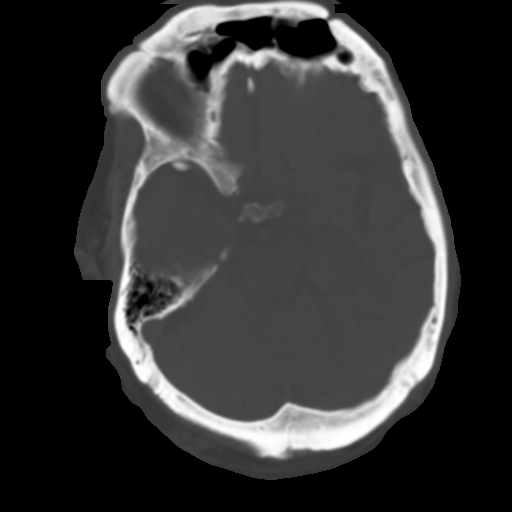
[im 14/34  brain]
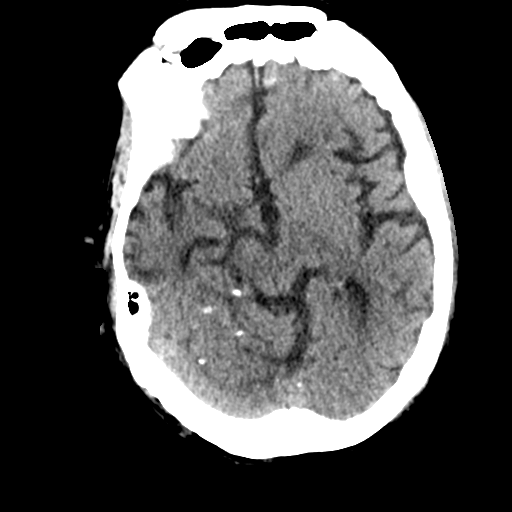
[im 16/34  brain]
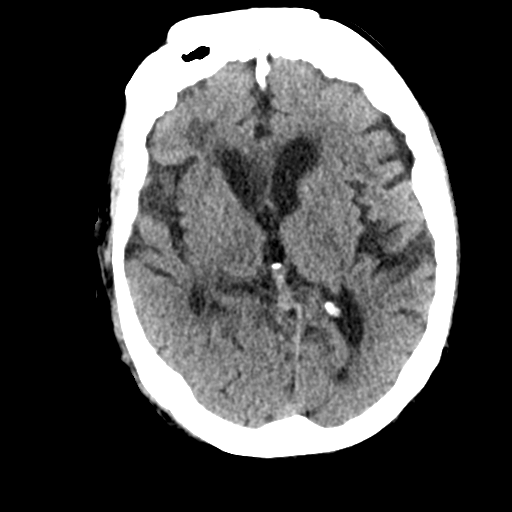
[im 18/34  brain]
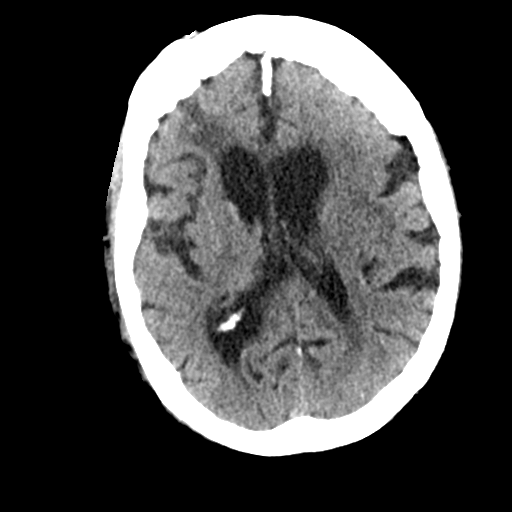
[im 20/34  brain]
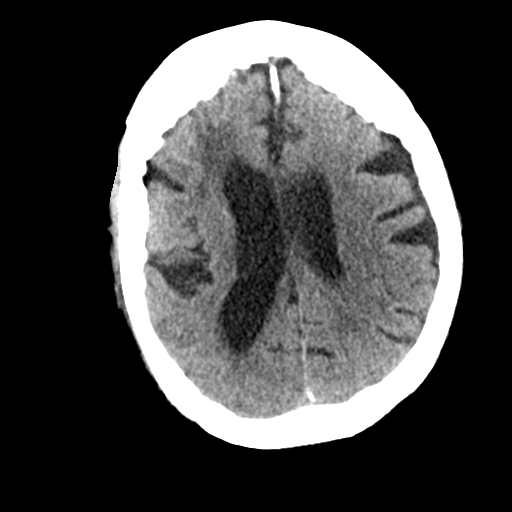
[im 20/34  bone]
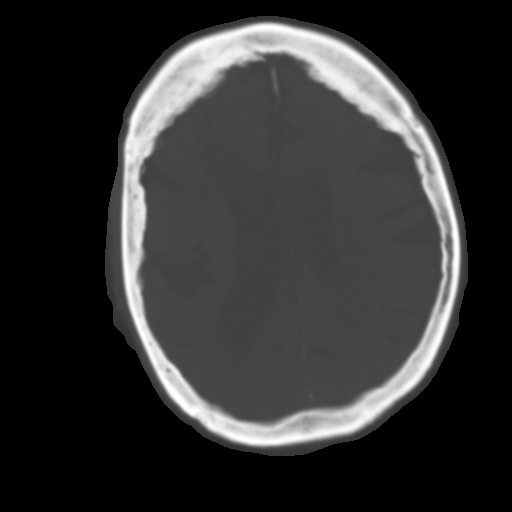
[im 23/34  brain]
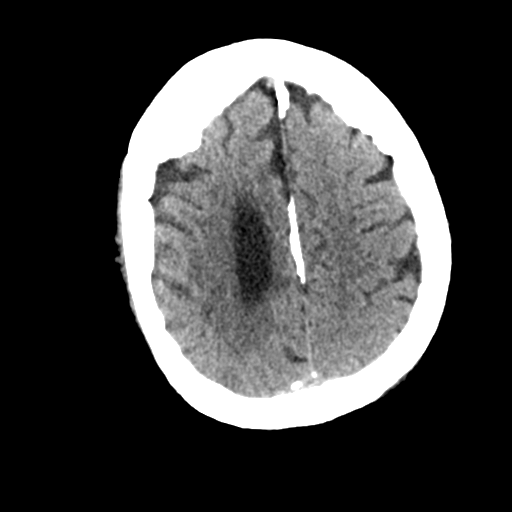
[im 25/34  brain]
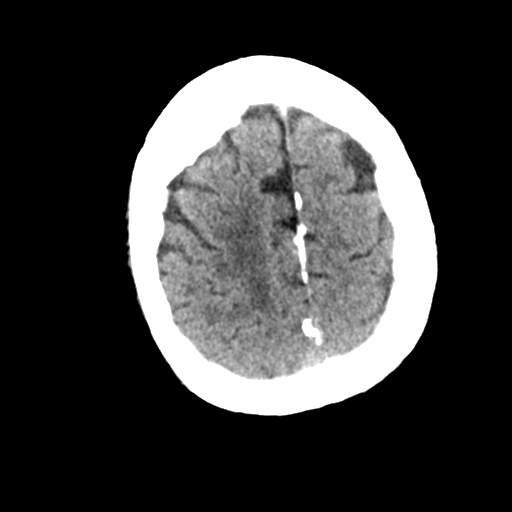
[im 27/34  brain]
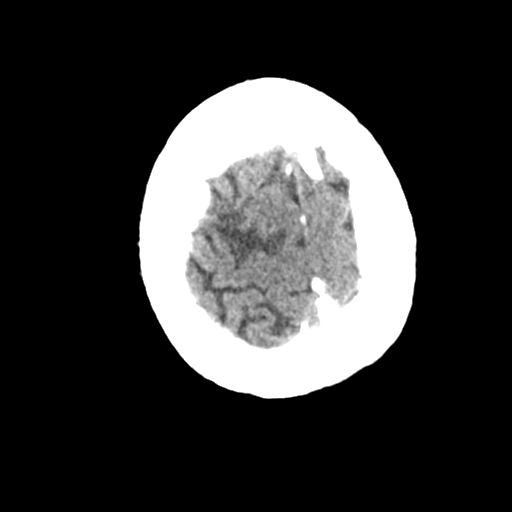
[im 29/34  brain]
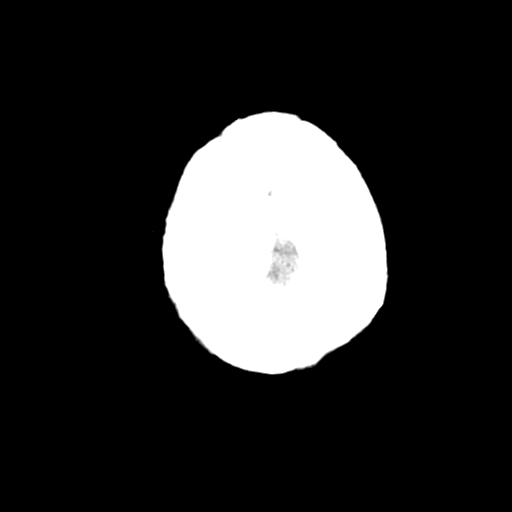
[im 29/34  bone]
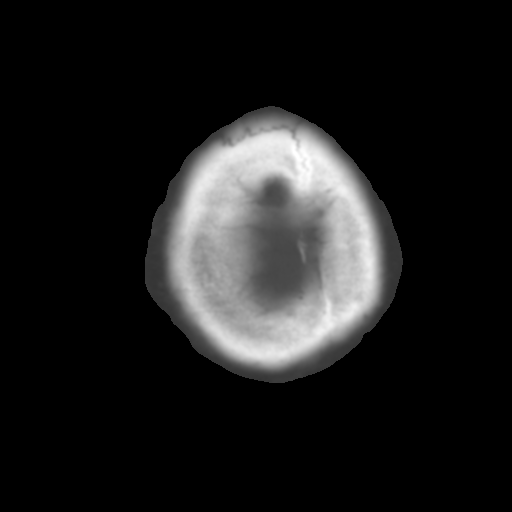
[im 31/34  brain]
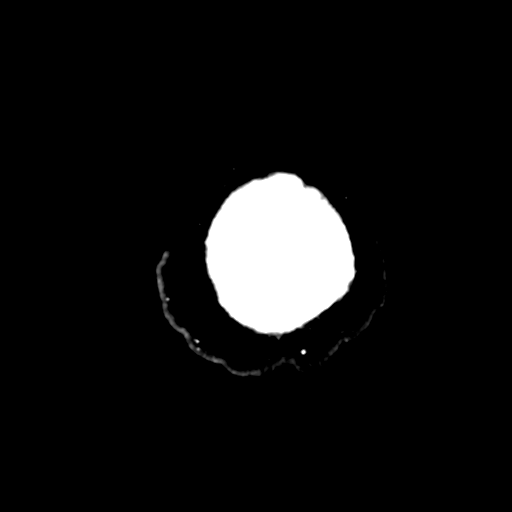

[14 of 30 positions shown; findings below may reference images not displayed]

FINDINGS: Moderate to severe ventriculomegaly on the basis of global
parenchymal brain volume loss. Small area RIGHT frontoparietal
encephalomalacia. No intraparenchymal hemorrhage, mass effect nor
midline shift. Patchy supratentorial white matter hypodensities are
within normal range for patient's age and though non-specific
suggest sequelae of chronic small vessel ischemic disease. No acute
large vascular territory infarcts. Scattered dural calcifications.

No abnormal extra-axial fluid collections. Basal cisterns are
patent. Moderate to severe calcific atherosclerosis of the carotid
siphons and included vertebral arteries.

No skull fracture. The included ocular globes and orbital contents
are non-suspicious. Status post RIGHT ocular lens implant. Mild to
moderate paranasal sinus mucosal thickening, old frontal sinus
craniotomy. Moderate RIGHT temporomandibular osteoarthrosis of
possible condylar fracture.
IMPRESSION: No acute intracranial process.

Moderate to severe global brain atrophy. RIGHT frontoparietal
encephalomalacia suggesting old infarct. Moderate chronic small
vessel ischemic disease.
